# Patient Record
Sex: Female | Born: 1945 | Race: White | Hispanic: No | Marital: Married | Smoking: Former smoker
Health system: Southern US, Community
[De-identification: ages and names within clinical notes are randomized; demographics above are authoritative.]

## PROBLEM LIST (undated history)

## (undated) DIAGNOSIS — J189 Pneumonia, unspecified organism: Secondary | ICD-10-CM

## (undated) DIAGNOSIS — T7840XA Allergy, unspecified, initial encounter: Secondary | ICD-10-CM

## (undated) DIAGNOSIS — M8580 Other specified disorders of bone density and structure, unspecified site: Secondary | ICD-10-CM

## (undated) HISTORY — PX: WISDOM TOOTH EXTRACTION: SHX21

## (undated) HISTORY — PX: NASAL SEPTUM SURGERY: SHX37

## (undated) HISTORY — DX: Other specified disorders of bone density and structure, unspecified site: M85.80

## (undated) HISTORY — DX: Pneumonia, unspecified organism: J18.9

## (undated) HISTORY — PX: BREAST LUMPECTOMY: SHX2

## (undated) HISTORY — PX: NASAL SINUS SURGERY: SHX719

## (undated) HISTORY — DX: Allergy, unspecified, initial encounter: T78.40XA

## (undated) HISTORY — PX: COLONOSCOPY: SHX174

---

## 1999-04-30 ENCOUNTER — Other Ambulatory Visit: Admission: RE | Admit: 1999-04-30 | Discharge: 1999-04-30 | Payer: Self-pay | Admitting: *Deleted

## 2000-08-05 ENCOUNTER — Other Ambulatory Visit: Admission: RE | Admit: 2000-08-05 | Discharge: 2000-08-05 | Payer: Self-pay | Admitting: Obstetrics and Gynecology

## 2001-11-08 ENCOUNTER — Other Ambulatory Visit: Admission: RE | Admit: 2001-11-08 | Discharge: 2001-11-08 | Payer: Self-pay | Admitting: Obstetrics and Gynecology

## 2003-01-25 ENCOUNTER — Other Ambulatory Visit: Admission: RE | Admit: 2003-01-25 | Discharge: 2003-01-25 | Payer: Self-pay | Admitting: Obstetrics and Gynecology

## 2003-03-08 ENCOUNTER — Encounter: Admission: RE | Admit: 2003-03-08 | Discharge: 2003-03-08 | Payer: Self-pay | Admitting: Family Medicine

## 2003-03-09 ENCOUNTER — Encounter: Admission: RE | Admit: 2003-03-09 | Discharge: 2003-03-09 | Payer: Self-pay | Admitting: Family Medicine

## 2004-09-30 ENCOUNTER — Encounter: Admission: RE | Admit: 2004-09-30 | Discharge: 2004-09-30 | Payer: Self-pay | Admitting: Family Medicine

## 2010-08-22 ENCOUNTER — Other Ambulatory Visit: Payer: Medicare Other

## 2010-08-22 ENCOUNTER — Encounter: Payer: Self-pay | Admitting: Internal Medicine

## 2010-08-22 ENCOUNTER — Ambulatory Visit (INDEPENDENT_AMBULATORY_CARE_PROVIDER_SITE_OTHER): Payer: Medicare Other | Admitting: Internal Medicine

## 2010-08-22 VITALS — BP 120/84 | HR 89 | Ht 67.0 in | Wt 115.6 lb

## 2010-08-22 DIAGNOSIS — J310 Chronic rhinitis: Secondary | ICD-10-CM

## 2010-08-22 NOTE — Patient Instructions (Signed)
Order- Allergy Profile/ RAST

## 2010-08-22 NOTE — Progress Notes (Signed)
  Subjective:    Patient ID: Sara Beasley, female    DOB: Jan 21, 1946, 65 y.o.   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 She had nos such problems before 5 years ago. At that time she developed nasal congestion and an aparent sinus infection after helping move her mother out of a dusty apartment. Symptoms cleared partially in the summer. Dr Haroldine Laws did sinus surgery 2 years ago- septoplasty/ turbinate reduction. She drained well after that, but continued to get apparent infections. Serial antibiotics. Usually feels congested, worse in winter. Nasal sprays and saline rinses have been little help. Usually does not feel that she is catching a cold at onset and does not get chest congestion, cough or wheeze, ear pain or itching/ sneezing. Has never recognized seasonal allergic rhinitis.No hx of asthma or ear infections.    Review of Systems See HPI Constitutional:   No weight loss, night sweats,  Fevers, chills, fatigue, lassitude. HEENT: Currently-   No headaches,  Difficulty swallowing,  Tooth/dental problems,  Sore throat,                No sneezing, itching, ear ache,, post nasal drip,   CV:  No chest pain,  Orthopnea, PND, swelling in lower extremities, anasarca, dizziness, palpitations  GI  No heartburn, indigestion, abdominal pain, nausea, vomiting, diarrhea, change in bowel habits, loss of appetite  Resp: No shortness of breath with exertion or at rest.  No excess mucus, no productive cough,  No non-productive cough,  No coughing up of blood.  No change in color of mucus.  No wheezing  Skin: no rash or lesions.  GU: no dysuria, change in color of urine, no urgency or frequency.  No flank pain.  MS:  No joint pain or swelling.  No decreased range of motion.  No back pain.  Psych:  No change in mood or affect. No depression or anxiety.  No memory loss.      Objective:   Physical  Exam General- Alert, Oriented, Affect-appropriate, Distress- none acute  Skin- rash-none, lesions- none, excoriation- none  Lymphadenopathy- none  Head- atraumatic  Eyes- Gross vision intact, PERRLA, conjunctivae clear secretions  Ears- Normal-  Hearing, canals, Tm L ,  R ,  Nose- Clear,  No-Septal dev, mucus, polyps, erosion, perforation   Throat- Mallampati II , mucosa clear , drainage- none, tonsils- atrophic  Neck- flexible , trachea midline, no stridor , thyroid nl, carotid no bruit  Chest - symmetrical excursion , unlabored     Heart/CV- RRR , no murmur , no gallop  , no rub, nl s1 s2                     - JVD- none , edema- none, stasis changes- none, varices- none     Lung- clear to P&A, wheeze- none, cough- none , dullness-none, rub- none     Chest wall-  Abd- tender-no, distended-no, bowel sounds-present, HSM- no  Br/ Gen/ Rectal- Not done, not indicated  Extrem- cyanosis- none, clubbing, none, atrophy- none, strength- nl  Neuro- grossly intact to observation         Assessment & Plan:

## 2010-08-22 NOTE — Assessment & Plan Note (Addendum)
Discussed irritant and infectious, vs allergic rhinosinusitis syndromes. I am asked if her adult onset pattern is allergic. We will start by sending allergy profile/. RAST. She is sensitive to overstimualtion by decongestants. She already uses a saline rinse.

## 2010-08-24 ENCOUNTER — Encounter: Payer: Self-pay | Admitting: Internal Medicine

## 2010-08-25 LAB — ALLERGY FULL PROFILE
Allergen, D pternoyssinus,d7: <0.35 kU/L (ref <0.35)
Allergen,Goose feathers, e70: <0.35 kU/L (ref <0.35)
Alternaria Alternata: <0.35 kU/L (ref <0.35)
Aspergillus fumigatus, IgG: <0.35 kU/L (ref <0.35)
Bahia Grass: 3.45 kU/L — ABNORMAL HIGH (ref <0.35)
Bermuda Grass: 4.11 kU/L — ABNORMAL HIGH (ref <0.35)
Box Elder IgE: 2.74 kU/L — ABNORMAL HIGH (ref <0.35)
Candida Albicans: <0.35 kU/L (ref <0.35)
Cat Dander: <0.35 kU/L (ref <0.35)
Common Ragweed: 3.73 kU/L — ABNORMAL HIGH (ref <0.35)
Curvularia lunata: <0.35 kU/L (ref <0.35)
D. farinae: <0.35 kU/L (ref <0.35)
Dog Dander: <0.35 kU/L (ref <0.35)
Elm IgE: 1.69 kU/L — ABNORMAL HIGH (ref <0.35)
Fescue: 2.95 kU/L — ABNORMAL HIGH (ref <0.35)
G005 Rye, Perennial: 2.8 kU/L — ABNORMAL HIGH (ref <0.35)
G009 Red Top: 3.15 kU/L — ABNORMAL HIGH (ref <0.35)
Goldenrod: 2.04 kU/L — ABNORMAL HIGH (ref <0.35)
Helminthosporium halodes: <0.35 kU/L (ref <0.35)
House Dust Hollister: <0.35 kU/L (ref <0.35)
IgE (Immunoglobulin E), Serum: 60 IU/mL (ref 0.0–180.0)
Lamb's Quarters: 3.02 kU/L — ABNORMAL HIGH (ref <0.35)
Oak: 2.33 kU/L — ABNORMAL HIGH (ref <0.35)
Plantain: 2.57 kU/L — ABNORMAL HIGH (ref <0.35)
Stemphylium Botryosum: <0.35 kU/L (ref <0.35)
Sycamore Tree: 2.53 kU/L — ABNORMAL HIGH (ref <0.35)
Timothy Grass: 2.68 kU/L — ABNORMAL HIGH (ref <0.35)

## 2010-09-15 NOTE — Progress Notes (Signed)
Quick Note:  Pt aware of results and to keep next appt. ______

## 2010-10-03 ENCOUNTER — Ambulatory Visit (INDEPENDENT_AMBULATORY_CARE_PROVIDER_SITE_OTHER): Payer: Medicare Other | Admitting: Internal Medicine

## 2010-10-03 ENCOUNTER — Encounter: Payer: Self-pay | Admitting: Internal Medicine

## 2010-10-03 VITALS — BP 108/60 | HR 68 | Ht 67.0 in | Wt 115.0 lb

## 2010-10-03 DIAGNOSIS — J310 Chronic rhinitis: Secondary | ICD-10-CM

## 2010-10-03 NOTE — Progress Notes (Signed)
Subjective:    Patient ID: Sara Beasley, female    DOB: Mar 17, 1946, 65 y.o.   MRN: [id number]  HPI    Review of Systems     Objective:   Physical Exam        Assessment & Plan:   Subjective:    Patient ID: Sara Beasley, female    DOB: 1946-01-24, 65 y.o.   MRN: [id number]  HPI 08/22/10- 31 yoF remote smoker, referred courtesy of Dr Haroldine Laws for allergy evaluation of chronic / recurrent rhinosinusitis. She had no such problems before 5 years ago. At that time she developed nasal congestion and an apparent sinus infection after helping move her mother out of a dusty apartment. Symptoms cleared partially in the summer. Dr Haroldine Laws did sinus surgery 2 years ago- septoplasty/ turbinate reduction. She drained well after that, but continued to get apparent infections. Serial antibiotics. Usually feels congested, worse in winter. Nasal sprays and saline rinses have been little help. Usually does not feel that she is catching a cold at onset and does not get chest congestion, cough or wheeze, ear pain or itching/ sneezing. Has never recognized seasonal allergic rhinitis.No hx of asthma or ear infections.   10/03/10 - chronic/ recurrent rhinosinusitis Allergy profile last visit- IgE 60 with significant grass and weed elevations. She sees winter predominance of symptoms, finding little effect of pollen and no benefit from singulair or allegra. She thinks infection is fading, with little discolored any more. Gets fairly regular postnasal drip that will trigger some cough.    Review of Systems See HPI Constitutional:   No weight loss, night sweats,  Fevers, chills, fatigue, lassitude. HEENT: Currently-   No headaches,  Difficulty swallowing,  Tooth/dental problems,  Sore throat,                No sneezing, itching, ear ache,  CV:  No chest pain,  Orthopnea, PND, swelling in lower extremities, anasarca, dizziness, palpitations  GI  No heartburn, indigestion, abdominal pain, nausea,  vomiting, diarrhea, change in bowel habits, loss of appetite  Resp: No shortness of breath with exertion or at rest.  No excess mucus, no productive cough,  No non-productive cough,  No coughing up of blood.  No change in color of mucus.  No wheezing  Skin: no rash or lesions.  GU: no dysuria, change in color of urine, no urgency or frequency.  No flank pain.  MS:  No joint pain or swelling.  No decreased range of motion.  No back pain.  Psych:  No change in mood or affect. No depression or anxiety.  No memory loss.      Objective:   Physical Exam General- Alert, Oriented, Affect-appropriate, Distress- none acute  Skin- rash-none, lesions- none, excoriation- none  Lymphadenopathy- none  Head- atraumatic  Eyes- Gross vision intact, PERRLA, conjunctivae clear secretions  Ears- Normal-  Hearing, canals, Tm - normal  Nose- Clear,  No-Septal dev, mucus, polyps, erosion, perforation There is more turbinate edema on right, but she doesn't feel it  Throat- Mallampati II , mucosa clear , drainage- none, tonsils- atrophic  Neck- flexible , trachea midline, no stridor , thyroid nl, carotid no bruit  Chest - symmetrical excursion , unlabored     Heart/CV- RRR , no murmur , no gallop  , no rub, nl s1 s2                     - JVD- none , edema- none, stasis changes- none,  varices- none     Lung- clear to P&A, wheeze- none, cough- none , dullness-none, rub- none     Chest wall-  Abd- tender-no, distended-no, bowel sounds-present, HSM- no  Br/ Gen/ Rectal- Not done, not indicated  Extrem- cyanosis- none, clubbing, none, atrophy- none, strength- nl  Neuro- grossly intact to observation         Assessment & Plan:

## 2010-10-03 NOTE — Assessment & Plan Note (Addendum)
I can't tell that the allergy pattern suggested by her in vitro test correlates well with the winter seasonal pattern she notes. Likely dryness and viruses are important in winter.  We will have her use her Neti pot with a little nasal steroid plus trial of nasalcrom.

## 2010-10-03 NOTE — Patient Instructions (Signed)
Use Neti pot regularly , once or twice daily  Sample Nasonex.   Put 3 sprays of Nasonex into each Neti pot treatment  Try Nasalcrom/ cromolyn nasal spray otc----- you can do both this and Neti pot

## 2010-10-05 ENCOUNTER — Encounter: Payer: Self-pay | Admitting: Internal Medicine

## 2010-10-08 ENCOUNTER — Telehealth: Payer: Self-pay | Admitting: Internal Medicine

## 2010-10-08 MED ORDER — MOMETASONE FUROATE 50 MCG/ACT NA SUSP: NASAL | Status: DC

## 2010-10-08 NOTE — Telephone Encounter (Signed)
Per CY: ok to change directions to reflect how pt is taking the Nasonex.  Rx sent to pharmacy.  LMOM informing pt of this.

## 2010-10-08 NOTE — Telephone Encounter (Signed)
Pt called back.  Pt is requesting rx for Nasonex.  She is still using 3 sprays bid in neti pot.  She states this has worked well for her and would like rx called into [facility] as soon as possible as she is running low on the sample that was given to her at last ov with CY on 6/8.  Pt is aware Cy out of office this PM and will return tomorrow.  Allergies  Allergen Reactions  . Sulfa Antibiotics

## 2010-10-08 NOTE — Telephone Encounter (Signed)
Ok to Rx Nasonex # 1, 2 sprays each nostril once daily at bedtime, refill prn        Thanks

## 2010-10-08 NOTE — Telephone Encounter (Signed)
[id number] 

## 2010-12-09 ENCOUNTER — Telehealth: Payer: Self-pay | Admitting: Pulmonary Disease

## 2010-12-09 NOTE — Telephone Encounter (Signed)
[email or web address].[name], CMA  

## 2010-12-10 ENCOUNTER — Encounter: Payer: Self-pay | Admitting: Internal Medicine

## 2010-12-10 ENCOUNTER — Telehealth: Payer: Self-pay | Admitting: Internal Medicine

## 2010-12-10 NOTE — Telephone Encounter (Signed)
Left message for patient to call me back; see will ask for me.

## 2010-12-10 NOTE — Telephone Encounter (Signed)
Spoke with patient-aware of recs from CY-has appt on 12-19-10 at 130pm to see CY and get depo and nasal tx prior to hiking trip. Pt will let me know then if she would like to have skin testing.

## 2010-12-10 NOTE — Telephone Encounter (Signed)
Returning call.

## 2010-12-10 NOTE — Telephone Encounter (Signed)
In response to her note faxed date 12/01/10- Suggest 1)  Try low dose otc decongestant phenylephrine 5 mg or 10 mg. She is sensitive to stimulants but may tolerate this, especially if taken with food. This could be used off and on as needed.        2) She can get nasal neb treatment with depomedrol injection shortly before her hiking trip        3) We can bring her in for allergy skin testing to see if she could be an allergy vaccine candidate as a                                                                                          long term strategy.

## 2010-12-10 NOTE — Telephone Encounter (Signed)
Per Florentina Addison see 8/15 phone note created by dr young for f/u on this question from pt, Florentina Addison states she will call pt herself since there are several issues she needs to go over with the pt.

## 2010-12-10 NOTE — Telephone Encounter (Signed)
lmomtcb x 2  

## 2010-12-19 ENCOUNTER — Ambulatory Visit (INDEPENDENT_AMBULATORY_CARE_PROVIDER_SITE_OTHER): Payer: Medicare Other | Admitting: Internal Medicine

## 2010-12-19 VITALS — BP 122/70 | HR 63 | Ht 67.0 in | Wt 114.8 lb

## 2010-12-19 DIAGNOSIS — J449 Chronic obstructive pulmonary disease, unspecified: Secondary | ICD-10-CM | POA: Insufficient documentation

## 2010-12-19 DIAGNOSIS — R05 Cough: Secondary | ICD-10-CM

## 2010-12-19 DIAGNOSIS — J310 Chronic rhinitis: Secondary | ICD-10-CM

## 2010-12-19 MED ORDER — BENZONATATE 200 MG PO CAPS: 200.0000 mg | ORAL_CAPSULE | Freq: Three times a day (TID) | ORAL | Status: AC | PRN

## 2010-12-19 NOTE — Assessment & Plan Note (Signed)
Hx bronchopneumonia 2004 with no imaging since. Persistent mild cough may be from nose or chest source. She has not time today for CXR. Consider MAIC?? Will get CXR on return and schedule Allergy skin testing.

## 2010-12-19 NOTE — Patient Instructions (Addendum)
Sample Patanase nasal antihistamine     1-2 puffs each nostril up to twice daily as needed  Script sent for benzonatate for cough as needed  Schedule return for allergy skin testing with   Order: CXR that day for dx cough Antihistamines block the skin tests. Please don't take any antihistamine, including the patanase nose spray, cough or cold preparations, otc cough and sleep meds  For 3 days before you return.

## 2010-12-19 NOTE — Assessment & Plan Note (Addendum)
She wants to avoid systemic steroids which is appropriate and reasonable as discussed. We will let her try tessalon and sample Patanase for her hiking trip.  She asked [place] to schedule allergy skin testing on her return.

## 2010-12-19 NOTE — Progress Notes (Signed)
Subjective:    Patient ID: Sara Beasley, female    DOB: 11-02-1945, 65 y.o.   MRN: [id number]  HPI    Review of Systems     Objective:   Physical Exam        Assessment & Plan:   Subjective:    Patient ID: Sara Beasley, female    DOB: October 22, 1945, 65 y.o.   MRN: [id number]  HPI    Review of Systems     Objective:   Physical Exam        Assessment & Plan:   Subjective:    Patient ID: Sara Beasley, female    DOB: 15-Nov-1945, 65 y.o.   MRN: [id number]  HPI 08/22/10- 61 yoF remote smoker, referred courtesy of Dr Haroldine Laws for allergy evaluation of chronic / recurrent rhinosinusitis. She had no such problems before 5 years ago. At that time she developed nasal congestion and an apparent sinus infection after helping move her mother out of a dusty apartment. Symptoms cleared partially in the summer. Dr Haroldine Laws did sinus surgery 2 years ago- septoplasty/ turbinate reduction. She drained well after that, but continued to get apparent infections. Serial antibiotics. Usually feels congested, worse in winter. Nasal sprays and saline rinses have been little help. Usually does not feel that she is catching a cold at onset and does not get chest congestion, cough or wheeze, ear pain or itching/ sneezing. Has never recognized seasonal allergic rhinitis.No hx of asthma or ear infections.   10/03/10 - chronic/ recurrent rhinosinusitis Allergy profile last visit- IgE 60 with significant grass and weed elevations. She sees winter predominance of symptoms, finding little effect of pollen and no benefit from singulair or allegra. She thinks infection is fading, with little discolored any more. Gets fairly regular postnasal drip that will trigger some cough.   12/19/10  64 yoF remote smoker, referred courtesy of Dr Haroldine Laws for allergy evaluation of chronic / recurrent rhinosinusitis. She called wanting treatment before hiking trip.  She reported trying a series of  remedies. She  is now doing Singulair, Nasonex and Sudafed 1/2 x 240mg . Notes drainage and cough with yellow and throat clearing, rather than stuffiness. It is much less intense than it was.  Hx  of bronchopneumonia in 2004 by CT and CXR, with no f/u since.  Allergy profile had shown IgE 60 with elevations. She thinks she may be interested in skin testing in the future.   Review of Systems See HPI Constitutional:   No weight loss, night sweats,  Fevers, chills, fatigue, lassitude. HEENT: Currently-   No headaches,  Difficulty swallowing,  Tooth/dental problems,  Sore throat,                No sneezing, itching, ear ache, CV:  No chest pain,  Orthopnea, PND, swelling in lower extremities, anasarca, dizziness, palpitations GI  No heartburn, indigestion, abdominal pain, nausea, vomiting, diarrhea, change in bowel habits, loss of appetite Resp: No shortness of breath with exertion or at rest.  No excess mucus, no productive cough,  No non-productive cough,  No coughing up of blood.  No change in color of mucus.  No wheezing Skin: no rash or lesions. GU: no dysuria, change in color of urine, no urgency or frequency.  No flank pain. MS:  No joint pain or swelling.  No decreased range of motion.  No back pain. Psych:  No change in mood or affect. No depression or anxiety.  No memory loss.  Objective:   Physical Exam General- Alert, Oriented, Affect-appropriate, Distress- none acute,  slender Skin- rash-none, lesions- none, excoriation- none Lymphadenopathy- none Head- atraumatic            Eyes- Gross vision intact, PERRLA, conjunctivae clear secretions            Ears- Hearing, canals normal            Nose- Clear, no-Septal dev, mucus, polyps, erosion, perforation             Throat- Mallampati II , mucosa clear , drainage- none, tonsils- atrophic Neck- flexible , trachea midline, no stridor , thyroid nl, carotid no bruit Chest - symmetrical excursion , unlabored           Heart/CV- RRR , no murmur ,  no gallop  , no rub, nl s1 s2                           - JVD- none , edema- none, stasis changes- none, varices- none           Lung- clear to P&A, wheeze- none, cough- none , dullness-none, rub- none           Chest wall-  Abd- tender-no, distended-no, bowel sounds-present, HSM- no Br/ Gen/ Rectal- Not done, not indicated Extrem- cyanosis- none, clubbing, none, atrophy- none, strength- nl Neuro- grossly intact to observation          Assessment & Plan:

## 2010-12-23 ENCOUNTER — Encounter: Payer: Self-pay | Admitting: Internal Medicine

## 2011-01-07 ENCOUNTER — Telehealth: Payer: Self-pay | Admitting: Internal Medicine

## 2011-01-07 NOTE — Telephone Encounter (Signed)
KATIE- PLEASE MAKE SURE SHE KNOWS THE PROTOCOL FOR ALLERGY TESTING. THANKS. Hazel Sams

## 2011-01-07 NOTE — Telephone Encounter (Signed)
LMTCB

## 2011-01-08 NOTE — Telephone Encounter (Signed)
Spoke with patient-scheduled for 01-19-11 at 230 and aware of allergy protocol.

## 2011-01-19 ENCOUNTER — Ambulatory Visit: Payer: Medicare Other | Admitting: Internal Medicine

## 2011-01-19 ENCOUNTER — Ambulatory Visit (INDEPENDENT_AMBULATORY_CARE_PROVIDER_SITE_OTHER): Payer: Medicare Other | Admitting: Internal Medicine

## 2011-01-19 ENCOUNTER — Encounter: Payer: Self-pay | Admitting: Internal Medicine

## 2011-01-19 VITALS — BP 136/80 | HR 80 | Ht 67.0 in | Wt 113.6 lb

## 2011-01-19 DIAGNOSIS — R05 Cough: Secondary | ICD-10-CM

## 2011-01-19 DIAGNOSIS — J310 Chronic rhinitis: Secondary | ICD-10-CM

## 2011-01-19 DIAGNOSIS — R059 Cough, unspecified: Secondary | ICD-10-CM

## 2011-01-19 NOTE — Progress Notes (Signed)
Subjective:    Patient ID: Sara Beasley, female    DOB: [birth year], 65 y.o.   MRN: [id number]  HPI  HPI 08/22/10- 25 yoF remote smoker, referred courtesy of Dr Haroldine Laws for allergy evaluation of chronic / recurrent rhinosinusitis. She had no such problems before 5 years ago. At that time she developed nasal congestion and an apparent sinus infection after helping move her mother out of a dusty apartment. Symptoms cleared partially in the summer. Dr Haroldine Laws did sinus surgery 2 years ago- septoplasty/ turbinate reduction. She drained well after that, but continued to get apparent infections. Serial antibiotics. Usually feels congested, worse in winter. Nasal sprays and saline rinses have been little help. Usually does not feel that she is catching a cold at onset and does not get chest congestion, cough or wheeze, ear pain or itching/ sneezing. Has never recognized seasonal allergic rhinitis.No hx of asthma or ear infections.   10/03/10 - chronic/ recurrent rhinosinusitis Allergy profile last visit- IgE 60 with significant grass and weed elevations. She sees winter predominance of symptoms, finding little effect of pollen and no benefit from singulair or allegra. She thinks infection is fading, with little discolored any more. Gets fairly regular postnasal drip that will trigger some cough.   12/19/10  64 yoF remote smoker, referred courtesy of Dr Haroldine Laws for allergy evaluation of chronic / recurrent rhinosinusitis. She called wanting treatment before hiking trip.  She reported trying a series of  remedies. She is now doing Singulair, Nasonex and Sudafed 1/2 x 240mg . Notes drainage and cough with yellow and throat clearing, rather than stuffiness. It is much less intense than it was.  Hx  of bronchopneumonia in 2004 by CT and CXR, with no f/u since.  Allergy profile had shown IgE 60 with elevations. She thinks she may be interested in skin testing in the future.   01/19/11-  64 yoF remote smoker,  referred courtesy of Dr Haroldine Laws for allergy evaluation of chronic / recurrent rhinosinusitis. Since last here had vacationed in [address] with wetter weather and felt more nasal congestion. Now in last few days she has been off meds for allergy testing, and weather is much cooler. She notices more runny, stuffy nose, throat clearing and sinus pressure. Chest not changed-ok.  Skin test-significant positive reactions for grass, tree, and weed pollens, dust mite, cockroach. She considers winter her worst season. We agreed to have her go back on her current maintenance medicines and see how she does this winter. We did in talk in detail about the possibility of allergy vaccine including realistic descriptions of logistics, risks and benefits.   Review of Systems Review of Systems See HPI Constitutional:   No weight loss, night sweats,  Fevers, chills, fatigue, lassitude. HEENT: No headaches,  Difficulty swallowing,  Tooth/dental problems,  Sore throat,                No sneezing, itching, ear ache, CV:  No chest pain,  Orthopnea, PND, swelling in lower extremities, anasarca, dizziness, palpitations GI  No heartburn, indigestion, abdominal pain, nausea, vomiting, diarrhea, change in bowel habits, loss of appetite Resp: No shortness of breath with exertion or at rest.  No excess mucus, no productive cough,  Minimal non-productive cough,  No coughing up of blood.  No change in color of mucus.  No wheezing Skin: no rash or lesions. GU: no dysuria, change in color of urine, no urgency or frequency.  No flank pain. MS:  No joint pain or swelling.  No decreased range of motion.  No back pain. Psych:  No change in mood or affect. No depression or anxiety.  No memory loss.      Objective:   Physical Exam General- Alert, Oriented, Affect-appropriate, Distress- none acute,  Slender/ thin chest Skin- rash-none, lesions- none, excoriation- none Lymphadenopathy- none Head- atraumatic             Eyes- Gross vision intact, PERRLA, conjunctivae clear secretions            Ears- Hearing, canals normal            Nose- Clear, no-Septal dev,  polyps, erosion, perforation;  + mucus bridging            Throat- Mallampati II , mucosa clear, dry;  drainage- none, tonsils- atrophic. +Repeated throat clearing. Neck- flexible , trachea midline, no stridor , thyroid nl, carotid no bruit Chest - symmetrical excursion , unlabored           Heart/CV- RRR , no murmur , no gallop  , no rub, nl s1 s2                           - JVD- none , edema- none, stasis changes- none, varices- none           Lung- clear to P&A, wheeze- none, cough- none , dullness-none, rub- none           Chest wall-  Abd- tender-no, distended-no, bowel sounds-present, HSM- no Br/ Gen/ Rectal- Not done, not indicated Extrem- cyanosis- none, clubbing, none, atrophy- none, strength- nl Neuro- grossly intact to observation

## 2011-01-19 NOTE — Patient Instructions (Signed)
Resume your current routine meds and let's see how you do. We can discuss options when you return in November.

## 2011-01-20 NOTE — Assessment & Plan Note (Signed)
She has been reasonably comfortable with her medications and appreciates the more having been off them for a few days prior to the skin testing. We talked very carefully about allergy vaccine as an option and she said she might want to try if she has a difficult winter and spring again this year. We discussed wintertime as less often and allergy issue, unless house dust is important, and more often related to temperature, humidity, dryness and colds. I gave information about environmental dust controls.

## 2011-01-20 NOTE — Assessment & Plan Note (Signed)
Cough seems much less important to her currently. Allergic rhinitis with postnasal drip may be the explanation.

## 2011-01-29 ENCOUNTER — Encounter: Payer: Self-pay | Admitting: Internal Medicine

## 2011-02-18 ENCOUNTER — Telehealth: Payer: Self-pay | Admitting: *Deleted

## 2011-02-18 NOTE — Telephone Encounter (Signed)
Pt. Called is interested in starting vac.Marland Kitchen She is first calling her [email or web address].to find out if they will pay for her shots and her vac. Or if she will have to pay. Her ins. Co[employer] Is asking what she is allergic to. Please write a rx so I can make her a card and fax it to her so she can fax it to her insurance co.. If you're okay with that.

## 2011-02-19 NOTE — Telephone Encounter (Signed)
Script written. To start allergy vaccine

## 2011-02-23 NOTE — Telephone Encounter (Signed)
Called pt. 02-23-11 will have vac. ready when pt. comes in Fri.Marland Kitchen

## 2011-02-25 ENCOUNTER — Ambulatory Visit (INDEPENDENT_AMBULATORY_CARE_PROVIDER_SITE_OTHER): Payer: Medicare Other

## 2011-02-25 DIAGNOSIS — J309 Allergic rhinitis, unspecified: Secondary | ICD-10-CM

## 2011-03-03 ENCOUNTER — Telehealth: Payer: Self-pay | Admitting: Internal Medicine

## 2011-03-03 NOTE — Telephone Encounter (Signed)
Called pt. At home & work left messages that I had talked to her last Mon. And she said she would be in that Fri.. Today I told her if she had any questions to please call me.

## 2011-03-04 ENCOUNTER — Ambulatory Visit: Payer: Medicare Other | Admitting: Internal Medicine

## 2011-03-04 ENCOUNTER — Ambulatory Visit (INDEPENDENT_AMBULATORY_CARE_PROVIDER_SITE_OTHER): Payer: Medicare Other

## 2011-03-04 DIAGNOSIS — J309 Allergic rhinitis, unspecified: Secondary | ICD-10-CM

## 2011-03-06 ENCOUNTER — Ambulatory Visit (INDEPENDENT_AMBULATORY_CARE_PROVIDER_SITE_OTHER): Payer: Medicare Other

## 2011-03-06 DIAGNOSIS — J309 Allergic rhinitis, unspecified: Secondary | ICD-10-CM

## 2011-03-10 ENCOUNTER — Ambulatory Visit (INDEPENDENT_AMBULATORY_CARE_PROVIDER_SITE_OTHER): Payer: Medicare Other

## 2011-03-10 DIAGNOSIS — J309 Allergic rhinitis, unspecified: Secondary | ICD-10-CM

## 2011-03-13 ENCOUNTER — Ambulatory Visit (INDEPENDENT_AMBULATORY_CARE_PROVIDER_SITE_OTHER): Payer: Medicare Other

## 2011-03-13 DIAGNOSIS — J309 Allergic rhinitis, unspecified: Secondary | ICD-10-CM

## 2011-03-16 ENCOUNTER — Ambulatory Visit: Payer: Medicare Other | Admitting: Internal Medicine

## 2011-03-17 ENCOUNTER — Ambulatory Visit (INDEPENDENT_AMBULATORY_CARE_PROVIDER_SITE_OTHER): Payer: Medicare Other

## 2011-03-17 DIAGNOSIS — J309 Allergic rhinitis, unspecified: Secondary | ICD-10-CM

## 2011-03-20 ENCOUNTER — Ambulatory Visit (INDEPENDENT_AMBULATORY_CARE_PROVIDER_SITE_OTHER): Payer: Medicare Other

## 2011-03-20 DIAGNOSIS — J309 Allergic rhinitis, unspecified: Secondary | ICD-10-CM

## 2011-03-23 ENCOUNTER — Ambulatory Visit (INDEPENDENT_AMBULATORY_CARE_PROVIDER_SITE_OTHER): Payer: Medicare Other

## 2011-03-23 DIAGNOSIS — J309 Allergic rhinitis, unspecified: Secondary | ICD-10-CM

## 2011-03-26 ENCOUNTER — Ambulatory Visit (INDEPENDENT_AMBULATORY_CARE_PROVIDER_SITE_OTHER): Payer: Medicare Other

## 2011-03-26 DIAGNOSIS — J309 Allergic rhinitis, unspecified: Secondary | ICD-10-CM

## 2011-04-01 ENCOUNTER — Ambulatory Visit (INDEPENDENT_AMBULATORY_CARE_PROVIDER_SITE_OTHER): Payer: Medicare Other

## 2011-04-01 DIAGNOSIS — J309 Allergic rhinitis, unspecified: Secondary | ICD-10-CM

## 2011-04-02 ENCOUNTER — Ambulatory Visit (INDEPENDENT_AMBULATORY_CARE_PROVIDER_SITE_OTHER): Payer: Medicare Other

## 2011-04-02 DIAGNOSIS — J309 Allergic rhinitis, unspecified: Secondary | ICD-10-CM

## 2011-04-03 ENCOUNTER — Ambulatory Visit (INDEPENDENT_AMBULATORY_CARE_PROVIDER_SITE_OTHER): Payer: Medicare Other

## 2011-04-03 DIAGNOSIS — J309 Allergic rhinitis, unspecified: Secondary | ICD-10-CM

## 2011-04-03 DIAGNOSIS — Z23 Encounter for immunization: Secondary | ICD-10-CM

## 2011-04-07 ENCOUNTER — Ambulatory Visit (INDEPENDENT_AMBULATORY_CARE_PROVIDER_SITE_OTHER): Payer: Medicare Other

## 2011-04-07 DIAGNOSIS — J309 Allergic rhinitis, unspecified: Secondary | ICD-10-CM

## 2011-04-10 ENCOUNTER — Ambulatory Visit (INDEPENDENT_AMBULATORY_CARE_PROVIDER_SITE_OTHER): Payer: Medicare Other

## 2011-04-10 ENCOUNTER — Ambulatory Visit: Payer: Medicare Other | Admitting: Internal Medicine

## 2011-04-10 DIAGNOSIS — J309 Allergic rhinitis, unspecified: Secondary | ICD-10-CM

## 2011-04-14 ENCOUNTER — Ambulatory Visit (INDEPENDENT_AMBULATORY_CARE_PROVIDER_SITE_OTHER): Payer: Medicare Other

## 2011-04-14 DIAGNOSIS — J309 Allergic rhinitis, unspecified: Secondary | ICD-10-CM

## 2011-04-20 ENCOUNTER — Ambulatory Visit (INDEPENDENT_AMBULATORY_CARE_PROVIDER_SITE_OTHER): Payer: Medicare Other

## 2011-04-20 DIAGNOSIS — J309 Allergic rhinitis, unspecified: Secondary | ICD-10-CM

## 2011-04-24 ENCOUNTER — Ambulatory Visit (INDEPENDENT_AMBULATORY_CARE_PROVIDER_SITE_OTHER): Payer: Medicare Other

## 2011-04-24 DIAGNOSIS — J309 Allergic rhinitis, unspecified: Secondary | ICD-10-CM

## 2011-04-27 ENCOUNTER — Ambulatory Visit (INDEPENDENT_AMBULATORY_CARE_PROVIDER_SITE_OTHER): Payer: Medicare Other

## 2011-04-27 DIAGNOSIS — J309 Allergic rhinitis, unspecified: Secondary | ICD-10-CM

## 2011-04-29 ENCOUNTER — Ambulatory Visit (INDEPENDENT_AMBULATORY_CARE_PROVIDER_SITE_OTHER): Payer: Medicare Other

## 2011-04-29 DIAGNOSIS — J309 Allergic rhinitis, unspecified: Secondary | ICD-10-CM

## 2011-04-30 ENCOUNTER — Ambulatory Visit (INDEPENDENT_AMBULATORY_CARE_PROVIDER_SITE_OTHER): Payer: Medicare Other

## 2011-04-30 DIAGNOSIS — J309 Allergic rhinitis, unspecified: Secondary | ICD-10-CM

## 2011-05-04 ENCOUNTER — Ambulatory Visit: Payer: Medicare Other | Admitting: Internal Medicine

## 2011-05-04 ENCOUNTER — Encounter: Payer: Self-pay | Admitting: Internal Medicine

## 2011-05-06 ENCOUNTER — Ambulatory Visit (INDEPENDENT_AMBULATORY_CARE_PROVIDER_SITE_OTHER): Payer: Medicare Other

## 2011-05-06 DIAGNOSIS — J309 Allergic rhinitis, unspecified: Secondary | ICD-10-CM

## 2011-05-08 ENCOUNTER — Ambulatory Visit (INDEPENDENT_AMBULATORY_CARE_PROVIDER_SITE_OTHER): Payer: Medicare Other

## 2011-05-08 DIAGNOSIS — J309 Allergic rhinitis, unspecified: Secondary | ICD-10-CM

## 2011-05-12 ENCOUNTER — Ambulatory Visit (INDEPENDENT_AMBULATORY_CARE_PROVIDER_SITE_OTHER): Payer: Medicare Other

## 2011-05-12 DIAGNOSIS — J309 Allergic rhinitis, unspecified: Secondary | ICD-10-CM

## 2011-05-15 ENCOUNTER — Ambulatory Visit (INDEPENDENT_AMBULATORY_CARE_PROVIDER_SITE_OTHER): Payer: Medicare Other

## 2011-05-15 DIAGNOSIS — J309 Allergic rhinitis, unspecified: Secondary | ICD-10-CM

## 2011-05-19 ENCOUNTER — Ambulatory Visit (INDEPENDENT_AMBULATORY_CARE_PROVIDER_SITE_OTHER): Payer: Medicare Other

## 2011-05-19 DIAGNOSIS — J309 Allergic rhinitis, unspecified: Secondary | ICD-10-CM

## 2011-05-22 ENCOUNTER — Ambulatory Visit (INDEPENDENT_AMBULATORY_CARE_PROVIDER_SITE_OTHER): Payer: Medicare Other | Admitting: Internal Medicine

## 2011-05-22 ENCOUNTER — Encounter: Payer: Self-pay | Admitting: Internal Medicine

## 2011-05-22 ENCOUNTER — Ambulatory Visit (INDEPENDENT_AMBULATORY_CARE_PROVIDER_SITE_OTHER): Payer: Medicare Other

## 2011-05-22 VITALS — BP 148/78 | HR 72 | Ht 67.0 in | Wt 117.6 lb

## 2011-05-22 DIAGNOSIS — J309 Allergic rhinitis, unspecified: Secondary | ICD-10-CM

## 2011-05-22 DIAGNOSIS — J310 Chronic rhinitis: Secondary | ICD-10-CM

## 2011-05-22 NOTE — Patient Instructions (Signed)
Continue present treatments.   It would be ok to try off and on Allegra some if you want to see what it is doing- it works the day you take it without a lot of carryover.

## 2011-05-22 NOTE — Progress Notes (Signed)
Patient ID: Sara Beasley, female    DOB: [birth year], 66 y.o.   MRN: [id number]  HPI  HPI 08/22/10- 17 yoF remote smoker, referred courtesy of Dr Haroldine Laws for allergy evaluation of chronic / recurrent rhinosinusitis. She had no such problems before 5 years ago. At that time she developed nasal congestion and an apparent sinus infection after helping move her mother out of a dusty apartment. Symptoms cleared partially in the summer. Dr Haroldine Laws did sinus surgery 2 years ago- septoplasty/ turbinate reduction. She drained well after that, but continued to get apparent infections. Serial antibiotics. Usually feels congested, worse in winter. Nasal sprays and saline rinses have been little help. Usually does not feel that she is catching a cold at onset and does not get chest congestion, cough or wheeze, ear pain or itching/ sneezing. Has never recognized seasonal allergic rhinitis.No hx of asthma or ear infections.   10/03/10 - chronic/ recurrent rhinosinusitis Allergy profile last visit- IgE 60 with significant grass and weed elevations. She sees winter predominance of symptoms, finding little effect of pollen and no benefit from singulair or allegra. She thinks infection is fading, with little discolored any more. Gets fairly regular postnasal drip that will trigger some cough.   12/19/10  64 yoF remote smoker, referred courtesy of Dr Haroldine Laws for allergy evaluation of chronic / recurrent rhinosinusitis. She called wanting treatment before hiking trip.  She reported trying a series of  remedies. She is now doing Singulair, Nasonex and Sudafed 1/2 x 240mg . Notes drainage and cough with yellow and throat clearing, rather than stuffiness. It is much less intense than it was.  Hx  of bronchopneumonia in 2004 by CT and CXR, with no f/u since.  Allergy profile had shown IgE 60 with elevations. She thinks she may be interested in skin testing in the future.   01/19/11-  64 yoF remote smoker, referred  courtesy of Dr Haroldine Laws for allergy evaluation of chronic / recurrent rhinosinusitis. Since last here had vacationed in [address] with wetter weather and felt more nasal congestion. Now in last few days she has been off meds for allergy testing, and weather is much cooler. She notices more runny, stuffy nose, throat clearing and sinus pressure. Chest not changed-ok.  Skin test-significant positive reactions for grass, tree, and weed pollens, dust mite, cockroach. She considers winter her worst season. We agreed to have her go back on her current maintenance medicines and see how she does this winter. We did in talk in detail about the possibility of allergy vaccine including realistic descriptions of logistics, risks and benefits.  05/22/11- 64 yoF remote smoker, referred courtesy of Dr Haroldine Laws for allergy evaluation of chronic / recurrent rhinosinusitis. She is hopeful allergy vaccine is contributing to the improvement she notices. She recognizes also the drier weather is helpful. Has not had a significant respiratory infection this winter. Still taking Allegra. Did have a sinusitis in October treated with Cleocin. Nasal discharge is still occasionally a little purulent with some blood but no pain or fever. She denies headache. Off of all nasal sprays now. Daily cough starts about 6 p.m. Occasional use of saline nasal rinse.  Review of Systems- See HPI Constitutional:   No weight loss, night sweats,  Fevers, chills, fatigue, lassitude. HEENT: No headaches,  Difficulty swallowing,  Tooth/dental problems,  Sore throat,                No sneezing, itching, ear ache, CV:  No chest pain,  Orthopnea,  PND, swelling in lower extremities, anasarca, dizziness, palpitations GI  No heartburn, indigestion, abdominal pain, nausea, vomiting, diarrhea, change in bowel habits, loss of appetite Resp: No shortness of breath with exertion or at rest.  No excess mucus, no productive cough,  Minimal non-productive  cough,  No coughing up of blood.  No change in color of mucus.  No wheezing Skin: no rash or lesions. GU: MS:  No joint pain or swelling.  No decreased range of motion.  No back pain. Psych:  No change in mood or affect. No depression or anxiety.  No memory loss.      Objective:   Physical Exam General- Alert, Oriented, Affect-appropriate, Distress- none acute,  Slender/ thin chest Skin- rash-none, lesions- none, excoriation- none Lymphadenopathy- none Head- atraumatic            Eyes- Gross vision intact, PERRLA, conjunctivae clear secretions            Ears- Hearing, canals normal            Nose- ? Some leukoplakia in nose vs white mucus and some red areas., no-Septal dev,  polyps, erosion, perforation;  + mucus bridging            Throat- Mallampati II , mucosa clear, dry;  drainage- none, tonsils- atrophic. +Repeated throat clearing. Neck- flexible , trachea midline, no stridor , thyroid nl, carotid no bruit Chest - symmetrical excursion , unlabored           Heart/CV- RRR , no murmur , no gallop  , no rub, nl s1 s2                           - JVD- none , edema- none, stasis changes- none, varices- none           Lung- clear to P&A, slight squeak cleared quickly, wheeze- none, cough- none , dullness-none, rub- none           Chest wall-  Abd- Br/ Gen/ Rectal- Not done, not indicated Extrem- cyanosis- none, clubbing, none, atrophy- none, strength- nl Neuro- grossly intact to observation

## 2011-05-24 NOTE — Assessment & Plan Note (Signed)
She will continue allergy vaccine we'll watch for changes as the spring pollen season comes in.

## 2011-05-27 ENCOUNTER — Ambulatory Visit (INDEPENDENT_AMBULATORY_CARE_PROVIDER_SITE_OTHER): Payer: Medicare Other

## 2011-05-27 DIAGNOSIS — J309 Allergic rhinitis, unspecified: Secondary | ICD-10-CM

## 2011-05-29 ENCOUNTER — Ambulatory Visit (INDEPENDENT_AMBULATORY_CARE_PROVIDER_SITE_OTHER): Payer: Medicare Other

## 2011-05-29 DIAGNOSIS — J309 Allergic rhinitis, unspecified: Secondary | ICD-10-CM

## 2011-06-01 ENCOUNTER — Ambulatory Visit (INDEPENDENT_AMBULATORY_CARE_PROVIDER_SITE_OTHER): Payer: Medicare Other

## 2011-06-01 DIAGNOSIS — J309 Allergic rhinitis, unspecified: Secondary | ICD-10-CM

## 2011-06-02 ENCOUNTER — Ambulatory Visit (INDEPENDENT_AMBULATORY_CARE_PROVIDER_SITE_OTHER): Payer: Medicare Other

## 2011-06-02 DIAGNOSIS — J309 Allergic rhinitis, unspecified: Secondary | ICD-10-CM

## 2011-06-05 ENCOUNTER — Ambulatory Visit (INDEPENDENT_AMBULATORY_CARE_PROVIDER_SITE_OTHER): Payer: Medicare Other

## 2011-06-05 DIAGNOSIS — J309 Allergic rhinitis, unspecified: Secondary | ICD-10-CM

## 2011-06-09 ENCOUNTER — Ambulatory Visit (INDEPENDENT_AMBULATORY_CARE_PROVIDER_SITE_OTHER): Payer: Medicare Other

## 2011-06-09 ENCOUNTER — Telehealth: Payer: Self-pay | Admitting: *Deleted

## 2011-06-09 DIAGNOSIS — J309 Allergic rhinitis, unspecified: Secondary | ICD-10-CM

## 2011-06-09 NOTE — Telephone Encounter (Signed)
Pt.started sneezing uncontrollably last Wed.&Thurs.(10 to 12 times a day) Sneezing started subsiding Fri.. Then Sat.&Sun.she got congested,had sinus pressure. She saw Dr.CrossleyMon. He gave her zithromaz ,mucinex & 1/2 of an allegra. Her question is could the sneezing have brought on the sinus infection? Please advise pt.Marland Kitchen

## 2011-06-10 NOTE — Telephone Encounter (Signed)
Pt aware that sneezing did not cause her recent symptoms that were treated by Dr. Haroldine Laws. And verbalized understanding.

## 2011-06-10 NOTE — Telephone Encounter (Signed)
Pt returned triage's call.  Holly D Pryor ° °

## 2011-06-10 NOTE — Telephone Encounter (Signed)
lmomtcb x1 for pt 

## 2011-06-10 NOTE — Telephone Encounter (Signed)
Sneezing marked the beginning of inflammation that led to swelling, increased mucus, poor drainage, and then bacterial growth in the sinuses. Sneezing was just the first sign, not the cause.

## 2011-06-12 ENCOUNTER — Ambulatory Visit (INDEPENDENT_AMBULATORY_CARE_PROVIDER_SITE_OTHER): Payer: Medicare Other

## 2011-06-12 DIAGNOSIS — J309 Allergic rhinitis, unspecified: Secondary | ICD-10-CM

## 2011-06-17 ENCOUNTER — Ambulatory Visit (INDEPENDENT_AMBULATORY_CARE_PROVIDER_SITE_OTHER): Payer: Medicare Other

## 2011-06-17 DIAGNOSIS — J309 Allergic rhinitis, unspecified: Secondary | ICD-10-CM

## 2011-06-18 ENCOUNTER — Encounter: Payer: Self-pay | Admitting: Internal Medicine

## 2011-06-24 ENCOUNTER — Ambulatory Visit (INDEPENDENT_AMBULATORY_CARE_PROVIDER_SITE_OTHER): Payer: Medicare Other

## 2011-06-24 DIAGNOSIS — J309 Allergic rhinitis, unspecified: Secondary | ICD-10-CM

## 2011-07-03 ENCOUNTER — Ambulatory Visit (INDEPENDENT_AMBULATORY_CARE_PROVIDER_SITE_OTHER): Payer: Medicare Other

## 2011-07-03 DIAGNOSIS — J309 Allergic rhinitis, unspecified: Secondary | ICD-10-CM

## 2011-07-13 ENCOUNTER — Ambulatory Visit (INDEPENDENT_AMBULATORY_CARE_PROVIDER_SITE_OTHER): Payer: Medicare Other

## 2011-07-13 DIAGNOSIS — J309 Allergic rhinitis, unspecified: Secondary | ICD-10-CM

## 2011-07-22 ENCOUNTER — Ambulatory Visit (INDEPENDENT_AMBULATORY_CARE_PROVIDER_SITE_OTHER): Payer: Medicare Other

## 2011-07-22 DIAGNOSIS — J309 Allergic rhinitis, unspecified: Secondary | ICD-10-CM

## 2011-07-29 ENCOUNTER — Ambulatory Visit (INDEPENDENT_AMBULATORY_CARE_PROVIDER_SITE_OTHER): Payer: Medicare Other

## 2011-07-29 DIAGNOSIS — J309 Allergic rhinitis, unspecified: Secondary | ICD-10-CM

## 2011-08-05 ENCOUNTER — Ambulatory Visit (INDEPENDENT_AMBULATORY_CARE_PROVIDER_SITE_OTHER): Payer: Medicare Other

## 2011-08-05 DIAGNOSIS — J309 Allergic rhinitis, unspecified: Secondary | ICD-10-CM

## 2011-08-12 ENCOUNTER — Ambulatory Visit (INDEPENDENT_AMBULATORY_CARE_PROVIDER_SITE_OTHER): Payer: Medicare Other

## 2011-08-12 DIAGNOSIS — J309 Allergic rhinitis, unspecified: Secondary | ICD-10-CM

## 2011-08-19 ENCOUNTER — Ambulatory Visit (INDEPENDENT_AMBULATORY_CARE_PROVIDER_SITE_OTHER): Payer: Medicare Other

## 2011-08-19 DIAGNOSIS — J309 Allergic rhinitis, unspecified: Secondary | ICD-10-CM

## 2011-08-26 ENCOUNTER — Ambulatory Visit (INDEPENDENT_AMBULATORY_CARE_PROVIDER_SITE_OTHER): Payer: Medicare Other

## 2011-08-26 DIAGNOSIS — J309 Allergic rhinitis, unspecified: Secondary | ICD-10-CM

## 2011-09-02 ENCOUNTER — Ambulatory Visit (INDEPENDENT_AMBULATORY_CARE_PROVIDER_SITE_OTHER): Payer: Medicare Other

## 2011-09-02 DIAGNOSIS — J309 Allergic rhinitis, unspecified: Secondary | ICD-10-CM

## 2011-09-08 ENCOUNTER — Encounter: Payer: Self-pay | Admitting: Internal Medicine

## 2011-09-09 ENCOUNTER — Ambulatory Visit (INDEPENDENT_AMBULATORY_CARE_PROVIDER_SITE_OTHER): Payer: Medicare Other

## 2011-09-09 DIAGNOSIS — J309 Allergic rhinitis, unspecified: Secondary | ICD-10-CM

## 2011-09-14 ENCOUNTER — Ambulatory Visit (INDEPENDENT_AMBULATORY_CARE_PROVIDER_SITE_OTHER): Payer: Medicare Other

## 2011-09-14 DIAGNOSIS — J309 Allergic rhinitis, unspecified: Secondary | ICD-10-CM

## 2011-09-17 ENCOUNTER — Ambulatory Visit (INDEPENDENT_AMBULATORY_CARE_PROVIDER_SITE_OTHER): Payer: Medicare Other

## 2011-09-17 DIAGNOSIS — J309 Allergic rhinitis, unspecified: Secondary | ICD-10-CM

## 2011-09-22 ENCOUNTER — Ambulatory Visit: Payer: Medicare Other | Admitting: Internal Medicine

## 2011-09-25 ENCOUNTER — Ambulatory Visit (INDEPENDENT_AMBULATORY_CARE_PROVIDER_SITE_OTHER): Payer: Medicare Other

## 2011-09-25 DIAGNOSIS — J309 Allergic rhinitis, unspecified: Secondary | ICD-10-CM

## 2011-10-02 ENCOUNTER — Ambulatory Visit (INDEPENDENT_AMBULATORY_CARE_PROVIDER_SITE_OTHER): Payer: Medicare Other

## 2011-10-02 DIAGNOSIS — J309 Allergic rhinitis, unspecified: Secondary | ICD-10-CM

## 2011-10-09 ENCOUNTER — Ambulatory Visit (INDEPENDENT_AMBULATORY_CARE_PROVIDER_SITE_OTHER): Payer: Medicare Other

## 2011-10-09 DIAGNOSIS — J309 Allergic rhinitis, unspecified: Secondary | ICD-10-CM

## 2011-10-16 ENCOUNTER — Ambulatory Visit (INDEPENDENT_AMBULATORY_CARE_PROVIDER_SITE_OTHER): Payer: Medicare Other

## 2011-10-16 DIAGNOSIS — J309 Allergic rhinitis, unspecified: Secondary | ICD-10-CM

## 2011-10-23 ENCOUNTER — Ambulatory Visit (INDEPENDENT_AMBULATORY_CARE_PROVIDER_SITE_OTHER): Payer: Medicare Other

## 2011-10-23 ENCOUNTER — Ambulatory Visit: Payer: Medicare Other | Admitting: Internal Medicine

## 2011-10-23 DIAGNOSIS — J309 Allergic rhinitis, unspecified: Secondary | ICD-10-CM

## 2011-11-02 ENCOUNTER — Ambulatory Visit (INDEPENDENT_AMBULATORY_CARE_PROVIDER_SITE_OTHER): Payer: Medicare Other

## 2011-11-02 DIAGNOSIS — J309 Allergic rhinitis, unspecified: Secondary | ICD-10-CM

## 2011-11-11 ENCOUNTER — Ambulatory Visit (INDEPENDENT_AMBULATORY_CARE_PROVIDER_SITE_OTHER): Payer: Medicare Other

## 2011-11-11 DIAGNOSIS — J309 Allergic rhinitis, unspecified: Secondary | ICD-10-CM

## 2011-11-18 ENCOUNTER — Ambulatory Visit (INDEPENDENT_AMBULATORY_CARE_PROVIDER_SITE_OTHER): Payer: Medicare Other

## 2011-11-18 DIAGNOSIS — J309 Allergic rhinitis, unspecified: Secondary | ICD-10-CM

## 2011-11-25 ENCOUNTER — Ambulatory Visit (INDEPENDENT_AMBULATORY_CARE_PROVIDER_SITE_OTHER): Payer: Medicare Other

## 2011-11-25 DIAGNOSIS — J309 Allergic rhinitis, unspecified: Secondary | ICD-10-CM

## 2011-12-03 ENCOUNTER — Ambulatory Visit (INDEPENDENT_AMBULATORY_CARE_PROVIDER_SITE_OTHER): Payer: Medicare Other

## 2011-12-03 ENCOUNTER — Encounter: Payer: Self-pay | Admitting: Internal Medicine

## 2011-12-03 ENCOUNTER — Ambulatory Visit (INDEPENDENT_AMBULATORY_CARE_PROVIDER_SITE_OTHER): Payer: Medicare Other | Admitting: Internal Medicine

## 2011-12-03 VITALS — BP 106/76 | HR 75 | Ht 67.0 in | Wt 114.0 lb

## 2011-12-03 DIAGNOSIS — J310 Chronic rhinitis: Secondary | ICD-10-CM

## 2011-12-03 DIAGNOSIS — J309 Allergic rhinitis, unspecified: Secondary | ICD-10-CM

## 2011-12-03 NOTE — Patient Instructions (Addendum)
We will have the allergy lab advance the strength of your allergy shots next time new vaccine is ordered  Sample Dymista nasal spray    1-2 puffs each nostril once daily at bedtime               If helpful, we can send a script. If not helpful, we can send a script to try a different nasal spray - Ipratropium

## 2011-12-03 NOTE — Progress Notes (Signed)
Patient ID: Sara Beasley, female    DOB: 1946-02-08, 66 y.o.   MRN: [id number]  HPI  HPI 08/22/10- 15 yoF remote smoker, referred courtesy of Dr Haroldine Laws for allergy evaluation of chronic / recurrent rhinosinusitis. She had no such problems before 5 years ago. At that time she developed nasal congestion and an apparent sinus infection after helping move her mother out of a dusty apartment. Symptoms cleared partially in the summer. Dr Haroldine Laws did sinus surgery 2 years ago- septoplasty/ turbinate reduction. She drained well after that, but continued to get apparent infections. Serial antibiotics. Usually feels congested, worse in winter. Nasal sprays and saline rinses have been little help. Usually does not feel that she is catching a cold at onset and does not get chest congestion, cough or wheeze, ear pain or itching/ sneezing. Has never recognized seasonal allergic rhinitis.No hx of asthma or ear infections.   10/03/10 - chronic/ recurrent rhinosinusitis Allergy profile last visit- IgE 60 with significant grass and weed elevations. She sees winter predominance of symptoms, finding little effect of pollen and no benefit from singulair or allegra. She thinks infection is fading, with little discolored any more. Gets fairly regular postnasal drip that will trigger some cough.   12/19/10  64 yoF remote smoker, referred courtesy of Dr Haroldine Laws for allergy evaluation of chronic / recurrent rhinosinusitis. She called wanting treatment before hiking trip.  She reported trying a series of  remedies. She is now doing Singulair, Nasonex and Sudafed 1/2 x 240mg . Notes drainage and cough with yellow and throat clearing, rather than stuffiness. It is much less intense than it was.  Hx  of bronchopneumonia in 2004 by CT and CXR, with no f/u since.  Allergy profile had shown IgE 60 with elevations. She thinks she may be interested in skin testing in the future.   01/19/11-  64 yoF remote smoker, referred  courtesy of Dr Haroldine Laws for allergy evaluation of chronic / recurrent rhinosinusitis. Since last here had vacationed in [address] with wetter weather and felt more nasal congestion. Now in last few days she has been off meds for allergy testing, and weather is much cooler. She notices more runny, stuffy nose, throat clearing and sinus pressure. Chest not changed-ok.  Skin test-significant positive reactions for grass, tree, and weed pollens, dust mite, cockroach. She considers winter her worst season. We agreed to have her go back on her current maintenance medicines and see how she does this winter. We did in talk in detail about the possibility of allergy vaccine including realistic descriptions of logistics, risks and benefits.  05/22/11- 64 yoF remote smoker, referred courtesy of Dr Haroldine Laws for allergy evaluation of chronic / recurrent rhinosinusitis. She is hopeful allergy vaccine is contributing to the improvement she notices. She recognizes also the drier weather is helpful. Has not had a significant respiratory infection this winter. Still taking Allegra. Did have a sinusitis in October treated with Cleocin. Nasal discharge is still occasionally a little purulent with some blood but no pain or fever. She denies headache. Off of all nasal sprays now. Daily cough starts about 6 p.m. Occasional use of saline nasal rinse.  12/03/11- 64 yoF remote smoker, referred courtesy of Dr Haroldine Laws for allergy evaluation of chronic / recurrent rhinosinusitis Still on vaccine 1:50 GH and doing well; unable to tell any difference if using allegra or Zyrtec; no sinus infection in about 8 months!!!! Still having cough and drainage though-starts late morning and will last about every 2-3  hours-not much trouble at night. She likes the way she is doing now and doesn't want to "rock the boat".  Review of Systems- See HPI Constitutional:   No weight loss, night sweats,  Fevers, chills, fatigue, lassitude. HEENT:  No headaches,  Difficulty swallowing,  Tooth/dental problems,  Sore throat,                No sneezing, itching, ear ache, CV:  No chest pain,  Orthopnea, PND, swelling in lower extremities, anasarca, dizziness, palpitations GI  No heartburn, indigestion, abdominal pain, nausea, vomiting,  Resp: No shortness of breath with exertion or at rest.  No excess mucus, no productive cough,  Minimal non-productive cough,  No coughing up of blood.  No change in color of mucus.  No wheezing Skin: no rash or lesions. GU: MS:  No joint pain or swelling.  No decreased range of motion.  No back pain. Psych:  No change in mood or affect. No depression or anxiety.  No memory loss.  Objective:   Physical Exam General- Alert, Oriented, Affect-appropriate, Distress- none acute,  Slender/ thin chest Skin- rash-none, lesions- none, excoriation- none Lymphadenopathy- none Head- atraumatic            Eyes- Gross vision intact, PERRLA, conjunctivae clear secretions            Ears- Hearing, canals normal            Nose- clear., no-Septal dev,  polyps, erosion, perforation;   mucus bridging            Throat- Mallampati II , mucosa clear, dry;  drainage- none, tonsils- atrophic. +Repeated throat clearing. Neck- flexible , trachea midline, no stridor , thyroid nl, carotid no bruit Chest - symmetrical excursion , unlabored           Heart/CV- RRR , no murmur , no gallop  , no rub, nl s1 s2                           - JVD- none , edema- none, stasis changes- none, varices- none           Lung- clear to P&A, slight squeak cleared quickly, wheeze- none, cough- none , dullness-none, rub- none           Chest wall-  Abd- Br/ Gen/ Rectal- Not done, not indicated Extrem- cyanosis- none, clubbing, none, atrophy- none, strength- nl Neuro- grossly intact to observation

## 2011-12-09 NOTE — Assessment & Plan Note (Signed)
Plan-sample Dymista.  Advance allergy vaccine to 1:10 next order

## 2011-12-10 ENCOUNTER — Ambulatory Visit (INDEPENDENT_AMBULATORY_CARE_PROVIDER_SITE_OTHER): Payer: Medicare Other

## 2011-12-10 DIAGNOSIS — J309 Allergic rhinitis, unspecified: Secondary | ICD-10-CM

## 2011-12-18 ENCOUNTER — Ambulatory Visit (INDEPENDENT_AMBULATORY_CARE_PROVIDER_SITE_OTHER): Payer: Medicare Other

## 2011-12-18 ENCOUNTER — Telehealth: Payer: Self-pay | Admitting: Internal Medicine

## 2011-12-18 DIAGNOSIS — J309 Allergic rhinitis, unspecified: Secondary | ICD-10-CM

## 2011-12-18 MED ORDER — IPRATROPIUM BROMIDE 0.06 % NA SOLN: NASAL | Status: DC

## 2011-12-18 NOTE — Telephone Encounter (Signed)
Left message for patient on number that Rx has been sent to pharmacy and if any questions or concerns please call the office.

## 2011-12-18 NOTE — Telephone Encounter (Signed)
Offer script ipratropium 0.06% nasal spray, 1 bottle, 1-2 puffs each nostril every 8 hours if needed, refill prn

## 2011-12-18 NOTE — Telephone Encounter (Signed)
Pt came by the office to get allergy shot-states that dymista sample given at last visit is not helping and would like to have something else called to pharmacy. Thanks.

## 2011-12-25 ENCOUNTER — Ambulatory Visit (INDEPENDENT_AMBULATORY_CARE_PROVIDER_SITE_OTHER): Payer: Medicare Other

## 2011-12-25 DIAGNOSIS — J309 Allergic rhinitis, unspecified: Secondary | ICD-10-CM

## 2011-12-30 ENCOUNTER — Encounter: Payer: Self-pay | Admitting: Internal Medicine

## 2012-01-01 ENCOUNTER — Ambulatory Visit (INDEPENDENT_AMBULATORY_CARE_PROVIDER_SITE_OTHER): Payer: Medicare Other

## 2012-01-01 DIAGNOSIS — J309 Allergic rhinitis, unspecified: Secondary | ICD-10-CM

## 2012-01-05 ENCOUNTER — Ambulatory Visit (INDEPENDENT_AMBULATORY_CARE_PROVIDER_SITE_OTHER): Payer: Medicare Other

## 2012-01-05 DIAGNOSIS — J309 Allergic rhinitis, unspecified: Secondary | ICD-10-CM

## 2012-01-08 ENCOUNTER — Telehealth: Payer: Self-pay | Admitting: Internal Medicine

## 2012-01-08 ENCOUNTER — Ambulatory Visit (INDEPENDENT_AMBULATORY_CARE_PROVIDER_SITE_OTHER): Payer: Medicare Other

## 2012-01-08 DIAGNOSIS — J309 Allergic rhinitis, unspecified: Secondary | ICD-10-CM

## 2012-01-08 NOTE — Telephone Encounter (Signed)
lmomtcb x1 

## 2012-01-11 NOTE — Telephone Encounter (Signed)
Per CY-try nasalcrom OTC

## 2012-01-11 NOTE — Telephone Encounter (Signed)
Spoke with pt and notified of recs per CDY Pt verbalized understanding and states nothing further needed  

## 2012-01-11 NOTE — Telephone Encounter (Signed)
Last OV: 12/03/2011, ROV in 1 year. I spoke with the pt and she states that she tried the Dymista but it caused nose bleeds and her to have a sore throat so she stopped it and is just using saline. She states the saline is not helping her symptoms, which are nasal drainage, sore throat, and cough. Pt ask if there is any other nasal spray she could use that may help, but not cause side effects. Please advise. Carron Curie, CMA Allergies  Allergen Reactions  . Sulfa Antibiotics

## 2012-01-11 NOTE — Telephone Encounter (Signed)
LMTCBx2. Avianah Pellman, CMA  

## 2012-01-15 ENCOUNTER — Ambulatory Visit (INDEPENDENT_AMBULATORY_CARE_PROVIDER_SITE_OTHER): Payer: Medicare Other

## 2012-01-15 DIAGNOSIS — J309 Allergic rhinitis, unspecified: Secondary | ICD-10-CM

## 2012-01-25 ENCOUNTER — Ambulatory Visit (INDEPENDENT_AMBULATORY_CARE_PROVIDER_SITE_OTHER): Payer: Medicare Other

## 2012-01-25 DIAGNOSIS — Z23 Encounter for immunization: Secondary | ICD-10-CM

## 2012-01-25 DIAGNOSIS — J309 Allergic rhinitis, unspecified: Secondary | ICD-10-CM

## 2012-02-02 ENCOUNTER — Ambulatory Visit (INDEPENDENT_AMBULATORY_CARE_PROVIDER_SITE_OTHER): Payer: Medicare Other

## 2012-02-02 DIAGNOSIS — J309 Allergic rhinitis, unspecified: Secondary | ICD-10-CM

## 2012-02-02 DIAGNOSIS — Z23 Encounter for immunization: Secondary | ICD-10-CM

## 2012-02-10 ENCOUNTER — Ambulatory Visit (INDEPENDENT_AMBULATORY_CARE_PROVIDER_SITE_OTHER): Payer: Medicare Other

## 2012-02-10 DIAGNOSIS — J309 Allergic rhinitis, unspecified: Secondary | ICD-10-CM

## 2012-02-12 ENCOUNTER — Telehealth: Payer: Self-pay | Admitting: Internal Medicine

## 2012-02-12 NOTE — Telephone Encounter (Signed)
Pt dropped off a note that reads the following: MEDS- AM: ZYRTEC. VITAMIN D, CALCIUM, MULTIVITAMIN x3/WEEK, WEEKLY ALLERGY INJECTIONS : D/C'd MEDS: SINGULAIR, (could not see clear benefit), off this about 3 wks-no adverse consequences noted, NASOCROM-could not see benefit-2 weeks ago-no changes noted, IPRATROPIUM BROMIDE NASAL SOLUTION- today:  I had use the Ipratropium Bromide several weeks ago. I stopped it, and reported this to you, because of nasal and throat discomfort. Thought I would give it another try, so I started using it again 10 days ago. Last night I woke up with a lot of congestion in my nose / throat, wheezing then coughing, then throat clearing (mostly throat clearing) nasal congestion and drainage in throat. Drank water, blew nose to clear my throat. I was up "about 20 mins. Don't wish to repeat this. I have gone back to nasal saline solution and only the meds listed above. In general am doing much better, especially in the last 5-6 days. Perhaps it's due to the change in the injections. What are your recs? Call me (or ask your nurse to call) at [phone]- ok to leave message. Thank you.

## 2012-02-12 NOTE — Telephone Encounter (Signed)
Per CY---stick with saline nasal spray through the weekend at least.  Suspect regular use of the ipratropium nasal spray overdried the nose.  Called and lmom to make the pt aware.  Pt was advised in the message to call back for any questions or concerns.

## 2012-02-16 ENCOUNTER — Ambulatory Visit (INDEPENDENT_AMBULATORY_CARE_PROVIDER_SITE_OTHER): Payer: Medicare Other

## 2012-02-16 DIAGNOSIS — J309 Allergic rhinitis, unspecified: Secondary | ICD-10-CM

## 2012-02-26 ENCOUNTER — Ambulatory Visit (INDEPENDENT_AMBULATORY_CARE_PROVIDER_SITE_OTHER): Payer: Medicare Other

## 2012-02-26 DIAGNOSIS — J309 Allergic rhinitis, unspecified: Secondary | ICD-10-CM

## 2012-03-03 ENCOUNTER — Ambulatory Visit (INDEPENDENT_AMBULATORY_CARE_PROVIDER_SITE_OTHER): Payer: Medicare Other

## 2012-03-03 DIAGNOSIS — J309 Allergic rhinitis, unspecified: Secondary | ICD-10-CM

## 2012-03-07 ENCOUNTER — Ambulatory Visit (INDEPENDENT_AMBULATORY_CARE_PROVIDER_SITE_OTHER): Payer: Medicare Other

## 2012-03-07 DIAGNOSIS — J309 Allergic rhinitis, unspecified: Secondary | ICD-10-CM

## 2012-03-15 ENCOUNTER — Ambulatory Visit (INDEPENDENT_AMBULATORY_CARE_PROVIDER_SITE_OTHER): Payer: Medicare Other

## 2012-03-15 DIAGNOSIS — J309 Allergic rhinitis, unspecified: Secondary | ICD-10-CM

## 2012-03-22 ENCOUNTER — Ambulatory Visit (INDEPENDENT_AMBULATORY_CARE_PROVIDER_SITE_OTHER): Payer: Medicare Other

## 2012-03-22 DIAGNOSIS — J309 Allergic rhinitis, unspecified: Secondary | ICD-10-CM

## 2012-03-30 ENCOUNTER — Ambulatory Visit (INDEPENDENT_AMBULATORY_CARE_PROVIDER_SITE_OTHER): Payer: Medicare Other

## 2012-03-30 DIAGNOSIS — J309 Allergic rhinitis, unspecified: Secondary | ICD-10-CM

## 2012-04-06 ENCOUNTER — Ambulatory Visit (INDEPENDENT_AMBULATORY_CARE_PROVIDER_SITE_OTHER): Payer: Medicare Other

## 2012-04-06 DIAGNOSIS — J309 Allergic rhinitis, unspecified: Secondary | ICD-10-CM

## 2012-04-14 ENCOUNTER — Ambulatory Visit (INDEPENDENT_AMBULATORY_CARE_PROVIDER_SITE_OTHER): Payer: Medicare Other

## 2012-04-14 DIAGNOSIS — J309 Allergic rhinitis, unspecified: Secondary | ICD-10-CM

## 2012-04-22 ENCOUNTER — Ambulatory Visit (INDEPENDENT_AMBULATORY_CARE_PROVIDER_SITE_OTHER): Payer: Medicare Other

## 2012-04-22 DIAGNOSIS — J309 Allergic rhinitis, unspecified: Secondary | ICD-10-CM

## 2012-04-26 ENCOUNTER — Encounter: Payer: Self-pay | Admitting: Internal Medicine

## 2012-04-29 ENCOUNTER — Ambulatory Visit (INDEPENDENT_AMBULATORY_CARE_PROVIDER_SITE_OTHER): Payer: Medicare Other

## 2012-04-29 DIAGNOSIS — J309 Allergic rhinitis, unspecified: Secondary | ICD-10-CM

## 2012-05-06 ENCOUNTER — Ambulatory Visit (INDEPENDENT_AMBULATORY_CARE_PROVIDER_SITE_OTHER): Payer: Medicare Other

## 2012-05-06 DIAGNOSIS — J309 Allergic rhinitis, unspecified: Secondary | ICD-10-CM

## 2012-05-10 ENCOUNTER — Ambulatory Visit (INDEPENDENT_AMBULATORY_CARE_PROVIDER_SITE_OTHER): Payer: Medicare Other

## 2012-05-10 DIAGNOSIS — J309 Allergic rhinitis, unspecified: Secondary | ICD-10-CM

## 2012-05-12 ENCOUNTER — Ambulatory Visit: Payer: Medicare Other

## 2012-05-13 ENCOUNTER — Ambulatory Visit (INDEPENDENT_AMBULATORY_CARE_PROVIDER_SITE_OTHER): Payer: Medicare Other

## 2012-05-13 DIAGNOSIS — J309 Allergic rhinitis, unspecified: Secondary | ICD-10-CM

## 2012-05-19 ENCOUNTER — Ambulatory Visit (INDEPENDENT_AMBULATORY_CARE_PROVIDER_SITE_OTHER): Payer: Medicare Other

## 2012-05-19 DIAGNOSIS — J309 Allergic rhinitis, unspecified: Secondary | ICD-10-CM

## 2012-05-27 ENCOUNTER — Ambulatory Visit (INDEPENDENT_AMBULATORY_CARE_PROVIDER_SITE_OTHER): Payer: Medicare Other

## 2012-05-27 DIAGNOSIS — J309 Allergic rhinitis, unspecified: Secondary | ICD-10-CM

## 2012-06-02 ENCOUNTER — Ambulatory Visit (INDEPENDENT_AMBULATORY_CARE_PROVIDER_SITE_OTHER): Payer: Medicare Other

## 2012-06-02 DIAGNOSIS — J309 Allergic rhinitis, unspecified: Secondary | ICD-10-CM

## 2012-06-03 ENCOUNTER — Ambulatory Visit: Payer: Medicare Other

## 2012-06-13 ENCOUNTER — Ambulatory Visit (INDEPENDENT_AMBULATORY_CARE_PROVIDER_SITE_OTHER): Payer: Medicare Other

## 2012-06-13 DIAGNOSIS — J309 Allergic rhinitis, unspecified: Secondary | ICD-10-CM

## 2012-06-14 ENCOUNTER — Ambulatory Visit: Payer: Medicare Other

## 2012-06-22 ENCOUNTER — Ambulatory Visit (INDEPENDENT_AMBULATORY_CARE_PROVIDER_SITE_OTHER): Payer: Medicare Other

## 2012-06-22 DIAGNOSIS — J309 Allergic rhinitis, unspecified: Secondary | ICD-10-CM

## 2012-06-24 ENCOUNTER — Ambulatory Visit: Payer: Medicare Other

## 2012-07-14 ENCOUNTER — Ambulatory Visit (INDEPENDENT_AMBULATORY_CARE_PROVIDER_SITE_OTHER): Payer: Medicare Other

## 2012-07-14 DIAGNOSIS — J309 Allergic rhinitis, unspecified: Secondary | ICD-10-CM

## 2012-07-21 ENCOUNTER — Ambulatory Visit (INDEPENDENT_AMBULATORY_CARE_PROVIDER_SITE_OTHER): Payer: Medicare Other

## 2012-07-21 DIAGNOSIS — J309 Allergic rhinitis, unspecified: Secondary | ICD-10-CM

## 2012-07-28 ENCOUNTER — Ambulatory Visit: Payer: Medicare Other

## 2012-08-02 ENCOUNTER — Ambulatory Visit (INDEPENDENT_AMBULATORY_CARE_PROVIDER_SITE_OTHER): Payer: Medicare Other

## 2012-08-02 DIAGNOSIS — J309 Allergic rhinitis, unspecified: Secondary | ICD-10-CM

## 2012-08-08 ENCOUNTER — Ambulatory Visit (INDEPENDENT_AMBULATORY_CARE_PROVIDER_SITE_OTHER): Payer: Medicare Other

## 2012-08-08 ENCOUNTER — Encounter: Payer: Self-pay | Admitting: Internal Medicine

## 2012-08-08 DIAGNOSIS — J309 Allergic rhinitis, unspecified: Secondary | ICD-10-CM

## 2012-08-11 ENCOUNTER — Ambulatory Visit: Payer: Medicare Other

## 2012-08-16 ENCOUNTER — Ambulatory Visit (INDEPENDENT_AMBULATORY_CARE_PROVIDER_SITE_OTHER): Payer: Medicare Other

## 2012-08-16 DIAGNOSIS — J309 Allergic rhinitis, unspecified: Secondary | ICD-10-CM

## 2012-08-18 ENCOUNTER — Ambulatory Visit: Payer: Medicare Other

## 2012-08-25 ENCOUNTER — Ambulatory Visit (INDEPENDENT_AMBULATORY_CARE_PROVIDER_SITE_OTHER): Payer: Medicare Other

## 2012-08-25 DIAGNOSIS — J309 Allergic rhinitis, unspecified: Secondary | ICD-10-CM

## 2012-09-01 ENCOUNTER — Ambulatory Visit: Payer: Medicare Other

## 2012-09-02 ENCOUNTER — Ambulatory Visit (INDEPENDENT_AMBULATORY_CARE_PROVIDER_SITE_OTHER): Payer: Medicare Other

## 2012-09-02 DIAGNOSIS — J309 Allergic rhinitis, unspecified: Secondary | ICD-10-CM

## 2012-09-09 ENCOUNTER — Ambulatory Visit (INDEPENDENT_AMBULATORY_CARE_PROVIDER_SITE_OTHER): Payer: Medicare Other

## 2012-09-09 DIAGNOSIS — J309 Allergic rhinitis, unspecified: Secondary | ICD-10-CM

## 2012-09-26 ENCOUNTER — Ambulatory Visit (INDEPENDENT_AMBULATORY_CARE_PROVIDER_SITE_OTHER): Payer: Medicare Other

## 2012-09-26 DIAGNOSIS — J309 Allergic rhinitis, unspecified: Secondary | ICD-10-CM

## 2012-10-06 ENCOUNTER — Ambulatory Visit (INDEPENDENT_AMBULATORY_CARE_PROVIDER_SITE_OTHER): Payer: Medicare Other

## 2012-10-06 DIAGNOSIS — J309 Allergic rhinitis, unspecified: Secondary | ICD-10-CM

## 2012-10-13 ENCOUNTER — Ambulatory Visit: Payer: Medicare Other

## 2012-10-18 ENCOUNTER — Ambulatory Visit (INDEPENDENT_AMBULATORY_CARE_PROVIDER_SITE_OTHER): Payer: Medicare Other

## 2012-10-18 DIAGNOSIS — J309 Allergic rhinitis, unspecified: Secondary | ICD-10-CM

## 2012-11-02 ENCOUNTER — Ambulatory Visit (INDEPENDENT_AMBULATORY_CARE_PROVIDER_SITE_OTHER): Payer: Medicare Other

## 2012-11-02 DIAGNOSIS — J309 Allergic rhinitis, unspecified: Secondary | ICD-10-CM

## 2012-11-03 ENCOUNTER — Ambulatory Visit (INDEPENDENT_AMBULATORY_CARE_PROVIDER_SITE_OTHER): Payer: Medicare Other

## 2012-11-03 DIAGNOSIS — J309 Allergic rhinitis, unspecified: Secondary | ICD-10-CM

## 2012-11-11 ENCOUNTER — Ambulatory Visit (INDEPENDENT_AMBULATORY_CARE_PROVIDER_SITE_OTHER): Payer: Medicare Other

## 2012-11-11 DIAGNOSIS — J309 Allergic rhinitis, unspecified: Secondary | ICD-10-CM

## 2012-11-14 ENCOUNTER — Ambulatory Visit: Payer: Medicare Other

## 2012-11-18 ENCOUNTER — Ambulatory Visit (INDEPENDENT_AMBULATORY_CARE_PROVIDER_SITE_OTHER): Payer: Medicare Other

## 2012-11-18 DIAGNOSIS — J309 Allergic rhinitis, unspecified: Secondary | ICD-10-CM

## 2012-11-25 ENCOUNTER — Ambulatory Visit (INDEPENDENT_AMBULATORY_CARE_PROVIDER_SITE_OTHER): Payer: Medicare Other

## 2012-11-25 DIAGNOSIS — J309 Allergic rhinitis, unspecified: Secondary | ICD-10-CM

## 2012-11-29 ENCOUNTER — Ambulatory Visit: Payer: Medicare Other | Admitting: Internal Medicine

## 2012-12-02 ENCOUNTER — Ambulatory Visit: Payer: Medicare Other

## 2012-12-05 ENCOUNTER — Ambulatory Visit (INDEPENDENT_AMBULATORY_CARE_PROVIDER_SITE_OTHER): Payer: Medicare Other

## 2012-12-05 DIAGNOSIS — J309 Allergic rhinitis, unspecified: Secondary | ICD-10-CM

## 2012-12-12 ENCOUNTER — Ambulatory Visit (INDEPENDENT_AMBULATORY_CARE_PROVIDER_SITE_OTHER): Payer: Medicare Other

## 2012-12-12 DIAGNOSIS — J309 Allergic rhinitis, unspecified: Secondary | ICD-10-CM

## 2012-12-19 ENCOUNTER — Ambulatory Visit: Payer: Medicare Other

## 2012-12-23 ENCOUNTER — Ambulatory Visit (INDEPENDENT_AMBULATORY_CARE_PROVIDER_SITE_OTHER): Payer: Medicare Other

## 2012-12-23 DIAGNOSIS — J309 Allergic rhinitis, unspecified: Secondary | ICD-10-CM

## 2012-12-28 ENCOUNTER — Ambulatory Visit (INDEPENDENT_AMBULATORY_CARE_PROVIDER_SITE_OTHER): Payer: Medicare Other

## 2012-12-28 DIAGNOSIS — J309 Allergic rhinitis, unspecified: Secondary | ICD-10-CM

## 2012-12-30 ENCOUNTER — Ambulatory Visit: Payer: Medicare Other

## 2013-01-06 ENCOUNTER — Ambulatory Visit (INDEPENDENT_AMBULATORY_CARE_PROVIDER_SITE_OTHER): Payer: Medicare Other

## 2013-01-06 DIAGNOSIS — J309 Allergic rhinitis, unspecified: Secondary | ICD-10-CM

## 2013-01-11 ENCOUNTER — Ambulatory Visit: Payer: Medicare Other | Admitting: Internal Medicine

## 2013-01-13 ENCOUNTER — Ambulatory Visit: Payer: Medicare Other

## 2013-01-16 ENCOUNTER — Ambulatory Visit (INDEPENDENT_AMBULATORY_CARE_PROVIDER_SITE_OTHER): Payer: Medicare Other

## 2013-01-16 DIAGNOSIS — J309 Allergic rhinitis, unspecified: Secondary | ICD-10-CM

## 2013-01-17 ENCOUNTER — Ambulatory Visit: Payer: Medicare Other | Admitting: Internal Medicine

## 2013-01-23 ENCOUNTER — Ambulatory Visit: Payer: Medicare Other

## 2013-01-24 ENCOUNTER — Ambulatory Visit (INDEPENDENT_AMBULATORY_CARE_PROVIDER_SITE_OTHER): Payer: Medicare Other

## 2013-01-24 DIAGNOSIS — J309 Allergic rhinitis, unspecified: Secondary | ICD-10-CM

## 2013-02-03 ENCOUNTER — Ambulatory Visit (INDEPENDENT_AMBULATORY_CARE_PROVIDER_SITE_OTHER): Payer: Medicare Other

## 2013-02-03 DIAGNOSIS — J309 Allergic rhinitis, unspecified: Secondary | ICD-10-CM

## 2013-02-09 ENCOUNTER — Ambulatory Visit (INDEPENDENT_AMBULATORY_CARE_PROVIDER_SITE_OTHER): Payer: Medicare Other

## 2013-02-09 DIAGNOSIS — J309 Allergic rhinitis, unspecified: Secondary | ICD-10-CM

## 2013-02-10 ENCOUNTER — Ambulatory Visit: Payer: Medicare Other

## 2013-02-17 ENCOUNTER — Ambulatory Visit (INDEPENDENT_AMBULATORY_CARE_PROVIDER_SITE_OTHER): Payer: Medicare Other

## 2013-02-17 DIAGNOSIS — J309 Allergic rhinitis, unspecified: Secondary | ICD-10-CM

## 2013-02-17 DIAGNOSIS — Z23 Encounter for immunization: Secondary | ICD-10-CM

## 2013-02-24 ENCOUNTER — Ambulatory Visit (INDEPENDENT_AMBULATORY_CARE_PROVIDER_SITE_OTHER): Payer: Medicare Other

## 2013-02-24 ENCOUNTER — Encounter: Payer: Self-pay | Admitting: Internal Medicine

## 2013-02-24 ENCOUNTER — Other Ambulatory Visit: Payer: Medicare Other

## 2013-02-24 ENCOUNTER — Ambulatory Visit (INDEPENDENT_AMBULATORY_CARE_PROVIDER_SITE_OTHER): Payer: Medicare Other | Admitting: Internal Medicine

## 2013-02-24 VITALS — BP 100/64 | HR 82 | Ht 67.0 in | Wt 111.4 lb

## 2013-02-24 DIAGNOSIS — J310 Chronic rhinitis: Secondary | ICD-10-CM

## 2013-02-24 DIAGNOSIS — J309 Allergic rhinitis, unspecified: Secondary | ICD-10-CM

## 2013-02-24 MED ORDER — IPRATROPIUM BROMIDE 0.03 % NA SOLN: NASAL | Status: DC

## 2013-02-24 NOTE — Patient Instructions (Signed)
Sample Omnaris nasal steroid spray    1-2 puffs each nostril once daily at bedtime  Script to try ipratropium nasal spray to turn off the water works, when needed  Order- lab Allergy Profile    I will compare these results to your previous skin test results and vaccine make-up.

## 2013-02-24 NOTE — Progress Notes (Signed)
Patient ID: Sara Beasley, female    DOB: 09/12/1945, 67 y.o.   MRN: [id number]  HPI 08/22/10- [age] yoF remote smoker, referred courtesy of Dr Haroldine Lawsrossley for allergy evaluation of chronic / recurrent rhinosinusitis. She had no such problems before 5 years ago. At that time she developed nasal congestion and an apparent sinus infection after helping move her mother out of a dusty apartment. Symptoms cleared partially in the summer. Dr Haroldine Lawsrossley did sinus surgery 2 years ago- septoplasty/ turbinate reduction. She drained well after that, but continued to get apparent infections. Serial antibiotics. Usually feels congested, worse in winter. Nasal sprays and saline rinses have been little help. Usually does not feel that she is catching a cold at onset and does not get chest congestion, cough or wheeze, ear pain or itching/ sneezing. Has never recognized seasonal allergic rhinitis.No hx of asthma or ear infections.   10/03/10 - chronic/ recurrent rhinosinusitis Allergy profile last visit- IgE 60 with significant grass and weed elevations. She sees winter predominance of symptoms, finding little effect of pollen and no benefit from singulair or allegra. She thinks infection is fading, with little discolored any more. Gets fairly regular postnasal drip that will trigger some cough.   12/19/10  64 yoF remote smoker, referred courtesy of Dr Haroldine Lawsrossley for allergy evaluation of chronic / recurrent rhinosinusitis. She called wanting treatment before hiking trip.  She reported trying a series of  remedies. She is now doing Singulair, Nasonex and Sudafed 1/2 x 240mg . Notes drainage and cough with yellow and throat clearing, rather than stuffiness. It is much less intense than it was.  Hx  of bronchopneumonia in 2004 by CT and CXR, with no f/u since.  Allergy profile had shown IgE 60 with elevations. She thinks she may be interested in skin testing in the future.   01/19/11-  64 yoF remote smoker, referred courtesy of  Dr Haroldine Lawsrossley for allergy evaluation of chronic / recurrent rhinosinusitis. [place] and felt more nasal congestion. Now in last few days she has been off meds for allergy testing, and weather is much cooler. She notices more runny, stuffy nose, throat clearing and sinus pressure. Chest not changed-ok.  Skin test-significant positive reactions for grass, tree, and weed pollens, dust mite, cockroach. She considers winter her worst season. We agreed to have her go back on her current maintenance medicines and see how she does this winter. We did in talk in detail about the possibility of allergy vaccine including realistic descriptions of logistics, risks and benefits.  05/22/11- 64 yoF remote smoker, referred courtesy of Dr Haroldine Lawsrossley for allergy evaluation of chronic / recurrent rhinosinusitis. She is hopeful allergy vaccine is contributing to the improvement she notices. She recognizes also the drier weather is helpful. Has not had a significant respiratory infection this winter. Still taking Allegra. Did have a sinusitis in October treated with Cleocin. Nasal discharge is still occasionally a little purulent with some blood but no pain or fever. She denies headache. Off of all nasal sprays now. Daily cough starts about 6 p.m. Occasional use of saline nasal rinse.  12/03/11- 64 yoF remote smoker, referred courtesy of Dr Haroldine Lawsrossley for allergy evaluation of chronic / recurrent rhinosinusitis Still on vaccine 1:50 GH and doing well; unable to tell any difference if using allegra or Zyrtec; no sinus infection in about 8 months!!!! Still having cough and drainage though-starts late morning and will last about every 2-3 hours-not much  trouble at night. She likes the way she is doing now and doesn't want to "rock the boat".  02/24/13- 67 yoF remote smoker, referred courtesy of Dr Haroldine Laws for allergy evaluation of chronic / recurrent rhinosinusitis FOLLOWS  FOR: still on Allergy vaccine 1:50 GH; can't tell if its helping much, spring and fall are the worst times of year for paitient. Increased drainage and nasal congestion. Rhinitis is perennial, not just in grass pollen season. Allergy profile had been significant for elevated grass pollen IgE. She saw Dr. Haroldine Laws for one sinus infection in the fall. He increase Singulair to 10 mg daily. She disliked Dymista. Nasal sprays are "harsh". We discussed [place].   Review of Systems- See HPI Constitutional:   No weight loss, night sweats,  Fevers, chills, fatigue, lassitude. HEENT: No headaches,  Difficulty swallowing,  Tooth/dental problems,  Sore throat,                +No sneezing, itching, ear ache, CV:  No chest pain,  Orthopnea, PND, swelling in lower extremities, anasarca, dizziness, palpitations GI  No heartburn, indigestion, abdominal pain, nausea, vomiting,  Resp: No shortness of breath with exertion or at rest.  No excess mucus, no productive cough,  Minimal non-productive cough,  No coughing up of blood.  No change in color of mucus.  No wheezing Skin: no rash or lesions. GU: MS:  No joint pain or swelling.  No decreased range of motion.  No back pain. Psych:  No change in mood or affect. No depression or anxiety.  No memory loss.  Objective:   Physical Exam General- Alert, Oriented, Affect-appropriate, Distress- none acute,  Slender/ thin chest Skin- rash-none, lesions- none, excoriation- none Lymphadenopathy- none Head- atraumatic            Eyes- Gross vision intact, PERRLA, conjunctivae clear secretions            Ears- Hearing, canals normal            Nose- clear., no-Septal dev,  polyps, erosion, perforation;   mucus bridging            Throat- Mallampati II , mucosa clear, dry;  drainage- none, tonsils- atrophic.  Neck- flexible , trachea midline, no stridor , thyroid nl, carotid no bruit Chest - symmetrical excursion , unlabored           Heart/CV- RRR , no murmur , no gallop   , no rub, nl s1 s2                           - JVD- none , edema- none, stasis changes- none, varices- none           Lung- clear to P&A, wheeze- none, cough- none , dullness-none, rub- none           Chest wall-  Abd- Br/ Gen/ Rectal- Not done, not indicated Extrem- cyanosis- none, clubbing, none, atrophy- none, strength- nl Neuro- grossly intact to observation

## 2013-02-27 LAB — ALLERGY FULL PROFILE
Allergen, D pternoyssinus,d7: <0.1 kU/L
Allergen,Goose feathers, e70: <0.1 kU/L
Alternaria Alternata: <0.1 kU/L
Aspergillus fumigatus, m3: <0.1 kU/L
Bahia Grass: 4.54 kU/L — ABNORMAL HIGH
Bermuda Grass: 5.59 kU/L — ABNORMAL HIGH
Box Elder IgE: 2.74 kU/L — ABNORMAL HIGH
Candida Albicans: <0.1 kU/L
Cat Dander: <0.1 kU/L
Common Ragweed: 5.11 kU/L — ABNORMAL HIGH
Curvularia lunata: <0.1 kU/L
D. farinae: <0.1 kU/L
Dog Dander: <0.1 kU/L
Elm IgE: 2.6 kU/L — ABNORMAL HIGH
Fescue: 4.08 kU/L — ABNORMAL HIGH
G005 Rye, Perennial: 4.85 kU/L — ABNORMAL HIGH
G009 Red Top: 4.44 kU/L — ABNORMAL HIGH
Goldenrod: 2.68 kU/L — ABNORMAL HIGH
Helminthosporium halodes: <0.1 kU/L
House Dust Hollister: <0.1 kU/L
IgE (Immunoglobulin E), Serum: 53.3 IU/mL (ref 0.0–180.0)
Lamb's Quarters: 4.43 kU/L — ABNORMAL HIGH
Oak: 2.43 kU/L — ABNORMAL HIGH
Plantain: 3.45 kU/L — ABNORMAL HIGH
Stemphylium Botryosum: <0.1 kU/L
Sycamore Tree: 2.45 kU/L — ABNORMAL HIGH
Timothy Grass: 4.12 kU/L — ABNORMAL HIGH

## 2013-03-02 ENCOUNTER — Telehealth: Payer: Self-pay | Admitting: Internal Medicine

## 2013-03-02 NOTE — Telephone Encounter (Signed)
Notes Recorded by Waymon Budge, MD on 02/28/2013 at 8:46 AM Allergy profile- Still strong antibody levels against grass, tree and weed pollens. It may be worthwhile going to a higher strength on the allergy vaccine, but we don't need to remake it to a different prescription.  [id number]. Carron Curie, CMA

## 2013-03-03 ENCOUNTER — Ambulatory Visit (INDEPENDENT_AMBULATORY_CARE_PROVIDER_SITE_OTHER): Payer: Medicare Other

## 2013-03-03 DIAGNOSIS — J309 Allergic rhinitis, unspecified: Secondary | ICD-10-CM

## 2013-03-03 NOTE — Telephone Encounter (Signed)
Pt aware of results. Spoke with CY about increasing dose amount for patients allergy vaccine.Per CY-we will keep patient at 1:10 strength but move dose to 0.58ml every week. I have documented this on patients vaccine sheet in allergy.

## 2013-03-03 NOTE — Telephone Encounter (Signed)
lmomtcb x2 for pt 

## 2013-03-10 ENCOUNTER — Ambulatory Visit (INDEPENDENT_AMBULATORY_CARE_PROVIDER_SITE_OTHER): Payer: Medicare Other

## 2013-03-10 DIAGNOSIS — J309 Allergic rhinitis, unspecified: Secondary | ICD-10-CM

## 2013-03-12 NOTE — Assessment & Plan Note (Signed)
Plan-sample Omnaris nasal spray, script for ipratropium, lab for allergy profile to compare with previous studies

## 2013-03-13 ENCOUNTER — Ambulatory Visit (INDEPENDENT_AMBULATORY_CARE_PROVIDER_SITE_OTHER): Payer: Medicare Other

## 2013-03-13 DIAGNOSIS — J309 Allergic rhinitis, unspecified: Secondary | ICD-10-CM

## 2013-03-17 ENCOUNTER — Ambulatory Visit: Payer: Medicare Other

## 2013-03-20 ENCOUNTER — Ambulatory Visit (INDEPENDENT_AMBULATORY_CARE_PROVIDER_SITE_OTHER): Payer: Medicare Other

## 2013-03-20 DIAGNOSIS — J309 Allergic rhinitis, unspecified: Secondary | ICD-10-CM

## 2013-03-30 ENCOUNTER — Ambulatory Visit (INDEPENDENT_AMBULATORY_CARE_PROVIDER_SITE_OTHER): Payer: Medicare Other

## 2013-03-30 DIAGNOSIS — J309 Allergic rhinitis, unspecified: Secondary | ICD-10-CM

## 2013-03-31 ENCOUNTER — Ambulatory Visit: Payer: Medicare Other

## 2013-04-06 ENCOUNTER — Ambulatory Visit: Payer: Medicare Other

## 2013-04-14 ENCOUNTER — Ambulatory Visit (INDEPENDENT_AMBULATORY_CARE_PROVIDER_SITE_OTHER): Payer: Medicare Other

## 2013-04-14 DIAGNOSIS — J309 Allergic rhinitis, unspecified: Secondary | ICD-10-CM

## 2013-04-21 ENCOUNTER — Ambulatory Visit: Payer: Medicare Other

## 2013-04-28 ENCOUNTER — Ambulatory Visit (INDEPENDENT_AMBULATORY_CARE_PROVIDER_SITE_OTHER): Payer: Medicare Other

## 2013-04-28 DIAGNOSIS — J309 Allergic rhinitis, unspecified: Secondary | ICD-10-CM

## 2013-05-05 ENCOUNTER — Ambulatory Visit: Payer: Medicare Other

## 2013-05-09 ENCOUNTER — Ambulatory Visit (INDEPENDENT_AMBULATORY_CARE_PROVIDER_SITE_OTHER): Payer: Medicare Other

## 2013-05-09 DIAGNOSIS — J309 Allergic rhinitis, unspecified: Secondary | ICD-10-CM

## 2013-05-17 ENCOUNTER — Ambulatory Visit (INDEPENDENT_AMBULATORY_CARE_PROVIDER_SITE_OTHER): Payer: Medicare Other

## 2013-05-17 DIAGNOSIS — J309 Allergic rhinitis, unspecified: Secondary | ICD-10-CM

## 2013-05-24 ENCOUNTER — Ambulatory Visit (INDEPENDENT_AMBULATORY_CARE_PROVIDER_SITE_OTHER): Payer: Medicare Other

## 2013-05-24 DIAGNOSIS — J309 Allergic rhinitis, unspecified: Secondary | ICD-10-CM

## 2013-05-31 ENCOUNTER — Ambulatory Visit (INDEPENDENT_AMBULATORY_CARE_PROVIDER_SITE_OTHER): Payer: Medicare Other

## 2013-05-31 DIAGNOSIS — J309 Allergic rhinitis, unspecified: Secondary | ICD-10-CM

## 2013-06-06 ENCOUNTER — Telehealth: Payer: Self-pay | Admitting: Internal Medicine

## 2013-06-06 ENCOUNTER — Ambulatory Visit (INDEPENDENT_AMBULATORY_CARE_PROVIDER_SITE_OTHER): Payer: Medicare Other

## 2013-06-06 DIAGNOSIS — J309 Allergic rhinitis, unspecified: Secondary | ICD-10-CM

## 2013-06-06 NOTE — Telephone Encounter (Signed)
lmomtcb x1 for pt 

## 2013-06-06 NOTE — Telephone Encounter (Signed)
Duplicate message. See prior phone note open

## 2013-06-07 MED ORDER — FLUTICASONE FUROATE-VILANTEROL 100-25 MCG/INH IN AEPB: 1.0000 | INHALATION_SPRAY | Freq: Every day | RESPIRATORY_TRACT | Status: DC

## 2013-06-07 MED ORDER — BENZONATATE 200 MG PO CAPS: 200.0000 mg | ORAL_CAPSULE | Freq: Three times a day (TID) | ORAL | Status: DC | PRN

## 2013-06-07 NOTE — Telephone Encounter (Signed)
I spoke with the pt and advised of recs. I have sent rx to pharmacy and left sample at front of breo for the pt with a note for the pt to be taught technique.

## 2013-06-07 NOTE — Telephone Encounter (Signed)
Returning call.Sara Beasley ° °

## 2013-06-07 NOTE — Telephone Encounter (Signed)
Patient returning call.

## 2013-06-07 NOTE — Telephone Encounter (Signed)
lmomtcb x1 for pt 

## 2013-06-07 NOTE — Telephone Encounter (Signed)
LMOMTCB x 1 

## 2013-06-07 NOTE — Telephone Encounter (Signed)
Last OV 02/24/13.I spoke with the pt and she states she was treated in Jan for a sinus infection and completed a course of zpak and clindamycin. She states that is cleared but she is still having a lot chest congestion. She states this has continued to get worse. She is also having a lot of PND, dry cough, throat clearing. Pt is asking for an appt. I offered tomorrow but she is going out of town. We have set an appt for 06-21-13 but pt wants recs in the meantime.   She also states that she wonders if her allergy vaccine needs to be increased. She states she is not feeling any benefits from it at current dose and would like recs for this as well. Please advise. Sara CurieJennifer Raja Beasley, CMA Allergies  Allergen Reactions  . Other     NO MYCIN DRUGS  . Sulfa Antibiotics

## 2013-06-07 NOTE — Telephone Encounter (Signed)
Suggest  1) come by for sample Breo Ellipta inhaler, 1 puff and rinse mouth, once daily  2) Offer Benzonatate perles 200 mg,  1 every 8 hours if needed for cough, # 30, Ref x 1  3) I will check on the allergy vaccine status with the lab.

## 2013-06-16 ENCOUNTER — Ambulatory Visit (INDEPENDENT_AMBULATORY_CARE_PROVIDER_SITE_OTHER): Payer: Medicare Other

## 2013-06-16 DIAGNOSIS — J309 Allergic rhinitis, unspecified: Secondary | ICD-10-CM

## 2013-06-21 ENCOUNTER — Ambulatory Visit (INDEPENDENT_AMBULATORY_CARE_PROVIDER_SITE_OTHER): Payer: Medicare Other | Admitting: Internal Medicine

## 2013-06-21 ENCOUNTER — Ambulatory Visit (INDEPENDENT_AMBULATORY_CARE_PROVIDER_SITE_OTHER)
Admission: RE | Admit: 2013-06-21 | Discharge: 2013-06-21 | Disposition: A | Discharge status: Home / Self Care | Payer: Medicare Other | Source: Ambulatory Visit | Attending: Internal Medicine | Admitting: Internal Medicine

## 2013-06-21 VITALS — BP 142/70 | HR 92 | Ht 67.0 in | Wt 111.2 lb

## 2013-06-21 DIAGNOSIS — J42 Unspecified chronic bronchitis: Secondary | ICD-10-CM

## 2013-06-21 DIAGNOSIS — J310 Chronic rhinitis: Secondary | ICD-10-CM

## 2013-06-21 DIAGNOSIS — R059 Cough, unspecified: Secondary | ICD-10-CM

## 2013-06-21 DIAGNOSIS — R05 Cough: Secondary | ICD-10-CM

## 2013-06-21 NOTE — Patient Instructions (Signed)
Order- CXR   Dx chronic bronchitis  Order- Sputum culture- routine C&S, AFB, Fungal

## 2013-06-21 NOTE — Progress Notes (Signed)
Patient ID: Sara AlbrightBurwell T Houdek, female    DOB: 09/12/1945, 68 y.o.   MRN: [id number]  HPI 08/22/10- [age] yoF remote smoker, referred courtesy of Dr Haroldine Lawsrossley for allergy evaluation of chronic / recurrent rhinosinusitis. She had no such problems before 5 years ago. At that time she developed nasal congestion and an apparent sinus infection after helping move her mother out of a dusty apartment. Symptoms cleared partially in the summer. Dr Haroldine Lawsrossley did sinus surgery 2 years ago- septoplasty/ turbinate reduction. She drained well after that, but continued to get apparent infections. Serial antibiotics. Usually feels congested, worse in winter. Nasal sprays and saline rinses have been little help. Usually does not feel that she is catching a cold at onset and does not get chest congestion, cough or wheeze, ear pain or itching/ sneezing. Has never recognized seasonal allergic rhinitis.No hx of asthma or ear infections.   10/03/10 - chronic/ recurrent rhinosinusitis Allergy profile last visit- IgE 60 with significant grass and weed elevations. She sees winter predominance of symptoms, finding little effect of pollen and no benefit from singulair or allegra. She thinks infection is fading, with little discolored any more. Gets fairly regular postnasal drip that will trigger some cough.   12/19/10  64 yoF remote smoker, referred courtesy of Dr Haroldine Lawsrossley for allergy evaluation of chronic / recurrent rhinosinusitis. She called wanting treatment before hiking trip.  She reported trying a series of  remedies. She is now doing Singulair, Nasonex and Sudafed 1/2 x 240mg . Notes drainage and cough with yellow and throat clearing, rather than stuffiness. It is much less intense than it was.  Hx  of bronchopneumonia in 2004 by CT and CXR, with no f/u since.  Allergy profile had shown IgE 60 with elevations. She thinks she may be interested in skin testing in the future.   01/19/11-  64 yoF remote smoker, referred courtesy of  Dr Haroldine Lawsrossley for allergy evaluation of chronic / recurrent rhinosinusitis. Since last here [place] weather and felt more nasal congestion. Now in last few days she has been off meds for allergy testing, and weather is much cooler. She notices more runny, stuffy nose, throat clearing and sinus pressure. Chest not changed-ok.  Skin test-significant positive reactions for grass, tree, and weed pollens, dust mite, cockroach. She considers winter her worst season. We agreed to have her go back on her current maintenance medicines and see how she does this winter. We did in talk in detail about the possibility of allergy vaccine including realistic descriptions of logistics, risks and benefits.  05/22/11- 64 yoF remote smoker, referred courtesy of Dr Haroldine Lawsrossley for allergy evaluation of chronic / recurrent rhinosinusitis. She is hopeful allergy vaccine is contributing to the improvement she notices. She recognizes also the drier weather is helpful. Has not had a significant respiratory infection this winter. Still taking Allegra. Did have a sinusitis in October treated with Cleocin. Nasal discharge is still occasionally a little purulent with some blood but no pain or fever. She denies headache. Off of all nasal sprays now. Daily cough starts about 6 p.m. Occasional use of saline nasal rinse.  12/03/11- 64 yoF remote smoker, referred courtesy of Dr Haroldine Lawsrossley for allergy evaluation of chronic / recurrent rhinosinusitis Still on vaccine 1:50 GH and doing well; unable to tell any difference if using allegra or Zyrtec; no sinus infection in about 8 months!!!! Still having cough and drainage though-starts late morning and will last about every 2-3 hours-not much  trouble at night. She likes the way she is doing now and doesn't want to "rock the boat".  02/24/13- 67 yoF remote smoker, referred courtesy of Dr Haroldine Lawsrossley for allergy evaluation of chronic / recurrent rhinosinusitis FOLLOWS  FOR: still on Allergy vaccine 1:50 GH; can't tell if its helping much, spring and fall are the worst times of year for paitient. Increased drainage and nasal congestion. Rhinitis is perennial, not just in grass pollen season. Allergy profile had been significant for elevated grass pollen IgE. She saw Dr. Haroldine Lawsrossley for one sinus infection in the fall. He increase Singulair to 10 mg daily. She disliked Dymista. Nasal sprays are "harsh". We discussed Grastek.   05/2513- 67 yoF remote smoker, referred courtesy of Dr Haroldine Lawsrossley for allergy evaluation of chronic / recurrent rhinosinusitis Follows For: Treated for sinus infection in December with Zpak then Clindamycin - Still c/o prod cough (yellow- green) - Chest congestion - Sinus drainage (occas green) - Some SOB on exertion - Denies wheezing, fever, sweat, nodes. Carries kleenex to blow nose. Continue Allergy Vaccine 1:10 GO. Allergy Profile 02/24/13- Total IgE 53.3, positive esp grass and tree pollens  Review of Systems- See HPI Constitutional:   No weight loss, night sweats,  Fevers, chills, fatigue, lassitude. HEENT: No headaches,  Difficulty swallowing,  Tooth/dental problems,  Sore throat,                +No sneezing, itching, ear ache, CV:  No chest pain, orthopnea, PND, swelling in lower extremities, anasarca, dizziness, palpitations GI  No heartburn, indigestion, abdominal pain, nausea, vomiting,  Resp: No shortness of breath with exertion or at rest.  No excess mucus, + productive cough,                        Minimal non-productive cough,  No coughing up of blood.  No change in color of mucus.  No               wheezing Skin: no rash or lesions. GU: MS:  No joint pain or swelling.  No decreased range of motion.  No back pain. Psych:  No change in mood or affect. No depression or anxiety.  No memory loss.  Objective:   Physical Exam General- Alert, Oriented, Affect-appropriate, Distress- none acute,  Slender/ thin chest Skin- rash-none,  lesions- none, excoriation- none Lymphadenopathy- none Head- atraumatic            Eyes- Gross vision intact, PERRLA, conjunctivae clear secretions            Ears- Hearing, canals normal            Nose- clear., no-Septal dev,  polyps, erosion, perforation;   mucus bridging            Throat- Mallampati II , mucosa clear, dry;  drainage- none, tonsils- atrophic.  Neck- flexible , trachea midline, no stridor , thyroid nl, carotid no bruit Chest - symmetrical excursion , unlabored           Heart/CV- RRR , no murmur , no gallop  , no rub, nl s1 s2                           - JVD- none , edema- none, stasis changes- none, varices- none           Lung- +faint crackle, wheeze- none, cough- none , dullness-none, rub- none  Chest wall-  Abd- Br/ Gen/ Rectal- Not done, not indicated Extrem- cyanosis- none, clubbing, none, atrophy- none, strength- nl Neuro- grossly intact to observation

## 2013-06-23 ENCOUNTER — Other Ambulatory Visit: Payer: Medicare Other

## 2013-06-23 ENCOUNTER — Ambulatory Visit (INDEPENDENT_AMBULATORY_CARE_PROVIDER_SITE_OTHER): Payer: Medicare Other

## 2013-06-23 DIAGNOSIS — J309 Allergic rhinitis, unspecified: Secondary | ICD-10-CM

## 2013-06-23 DIAGNOSIS — J42 Unspecified chronic bronchitis: Secondary | ICD-10-CM

## 2013-06-26 ENCOUNTER — Telehealth: Payer: Self-pay | Admitting: Internal Medicine

## 2013-06-26 DIAGNOSIS — J8410 Pulmonary fibrosis, unspecified: Secondary | ICD-10-CM

## 2013-06-26 LAB — RESPIRATORY CULTURE OR RESPIRATORY AND SPUTUM CULTURE

## 2013-06-26 NOTE — Telephone Encounter (Signed)
Pt is aware of results. Orders will be placed for CT and labs per CY.

## 2013-06-28 ENCOUNTER — Other Ambulatory Visit (INDEPENDENT_AMBULATORY_CARE_PROVIDER_SITE_OTHER): Payer: Medicare Other

## 2013-06-28 ENCOUNTER — Ambulatory Visit (INDEPENDENT_AMBULATORY_CARE_PROVIDER_SITE_OTHER): Payer: Medicare Other

## 2013-06-28 ENCOUNTER — Ambulatory Visit (INDEPENDENT_AMBULATORY_CARE_PROVIDER_SITE_OTHER): Payer: Medicare Other | Admitting: Internal Medicine

## 2013-06-28 DIAGNOSIS — J8410 Pulmonary fibrosis, unspecified: Secondary | ICD-10-CM

## 2013-06-28 DIAGNOSIS — J309 Allergic rhinitis, unspecified: Secondary | ICD-10-CM

## 2013-06-28 LAB — PULMONARY FUNCTION TEST
DL/VA % pred: 74 %
DL/VA: 3.85 ml/min/mmHg/L
DLCO UNC % PRED: 44 %
DLCO unc: 12.47 ml/min/mmHg
FEF 25-75 POST: 1.26 L/s
FEF 25-75 Pre: 1.37 L/sec
FEF2575-%CHANGE-POST: -7 %
FEF2575-%PRED-POST: 58 %
FEF2575-%Pred-Pre: 63 %
FEV1-%Change-Post: -2 %
FEV1-%Pred-Post: 68 %
FEV1-%Pred-Pre: 70 %
FEV1-PRE: 1.83 L
FEV1-Post: 1.78 L
FEV1FVC-%Change-Post: 0 %
FEV1FVC-%PRED-PRE: 98 %
FEV6-%Change-Post: -1 %
FEV6-%Pred-Post: 72 %
FEV6-%Pred-Pre: 73 %
FEV6-POST: 2.37 L
FEV6-Pre: 2.4 L
FEV6FVC-%CHANGE-POST: 0 %
FEV6FVC-%PRED-PRE: 102 %
FEV6FVC-%Pred-Post: 103 %
FVC-%CHANGE-POST: -2 %
FVC-%Pred-Post: 69 %
FVC-%Pred-Pre: 71 %
FVC-Post: 2.38 L
FVC-Pre: 2.43 L
POST FEV1/FVC RATIO: 75 %
PRE FEV1/FVC RATIO: 75 %
PRE FEV6/FVC RATIO: 99 %
Post FEV6/FVC ratio: 100 %
RV % pred: 91 %
RV: 2.1 L
TLC % pred: 76 %
TLC: 4.21 L

## 2013-06-28 LAB — HEPATIC FUNCTION PANEL
ALBUMIN: 3.6 g/dL (ref 3.5–5.2)
ALT: 14 U/L (ref 0–35)
AST: 22 U/L (ref 0–37)
Alkaline Phosphatase: 54 U/L (ref 39–117)
BILIRUBIN TOTAL: 0.5 mg/dL (ref 0.3–1.2)
Bilirubin, Direct: 0 mg/dL (ref 0.0–0.3)
Total Protein: 7.8 g/dL (ref 6.0–8.3)

## 2013-06-28 LAB — CBC WITH DIFFERENTIAL/PLATELET
BASOS PCT: 0.2 % (ref 0.0–3.0)
Basophils Absolute: 0 10*3/uL (ref 0.0–0.1)
EOS PCT: 1.9 % (ref 0.0–5.0)
Eosinophils Absolute: 0.2 10*3/uL (ref 0.0–0.7)
HCT: 38.6 % (ref 36.0–46.0)
HEMOGLOBIN: 12.6 g/dL (ref 12.0–15.0)
LYMPHS PCT: 17.2 % (ref 12.0–46.0)
Lymphs Abs: 1.7 10*3/uL (ref 0.7–4.0)
MCHC: 32.5 g/dL (ref 30.0–36.0)
MCV: 88.1 fl (ref 78.0–100.0)
Monocytes Absolute: 0.7 10*3/uL (ref 0.1–1.0)
Monocytes Relative: 7.5 % (ref 3.0–12.0)
NEUTROS ABS: 7.2 10*3/uL (ref 1.4–7.7)
NEUTROS PCT: 73.2 % (ref 43.0–77.0)
Platelets: 462 10*3/uL — ABNORMAL HIGH (ref 150.0–400.0)
RBC: 4.38 Mil/uL (ref 3.87–5.11)
RDW: 14 % (ref 11.5–14.6)
WBC: 9.9 10*3/uL (ref 4.5–10.5)

## 2013-06-28 LAB — BASIC METABOLIC PANEL
BUN: 19 mg/dL (ref 6–23)
CO2: 27 meq/L (ref 19–32)
Calcium: 9 mg/dL (ref 8.4–10.5)
Chloride: 101 mEq/L (ref 96–112)
Creatinine, Ser: 0.8 mg/dL (ref 0.4–1.2)
GFR: 77.91 mL/min (ref >60.00)
GLUCOSE: 107 mg/dL — AB (ref 70–99)
POTASSIUM: 4.3 meq/L (ref 3.5–5.1)
Sodium: 136 mEq/L (ref 135–145)

## 2013-06-28 NOTE — Progress Notes (Signed)
PFT done today. 

## 2013-06-29 ENCOUNTER — Encounter: Payer: Self-pay | Admitting: Internal Medicine

## 2013-06-29 NOTE — Assessment & Plan Note (Signed)
I am concerned there is more going on, with now a persistent bronchitis story Plan- sputum cultures, CXR

## 2013-06-29 NOTE — Assessment & Plan Note (Signed)
She continues allergy vaccine. We are looking more closely.

## 2013-06-30 ENCOUNTER — Ambulatory Visit (INDEPENDENT_AMBULATORY_CARE_PROVIDER_SITE_OTHER)
Admission: RE | Admit: 2013-06-30 | Discharge: 2013-06-30 | Disposition: A | Discharge status: Home / Self Care | Payer: Medicare Other | Source: Ambulatory Visit | Attending: Internal Medicine | Admitting: Internal Medicine

## 2013-06-30 DIAGNOSIS — J8410 Pulmonary fibrosis, unspecified: Secondary | ICD-10-CM

## 2013-06-30 LAB — QUANTIFERON TB GOLD ASSAY (BLOOD)
Interferon Gamma Release Assay: NEGATIVE
Mitogen value: 6.67 IU/mL
QUANTIFERON TB AG MINUS NIL: 0.02 [IU]/mL
Quantiferon Nil Value: 0.36 IU/mL
TB Ag value: 0.38 IU/mL

## 2013-06-30 MED ORDER — IOHEXOL 300 MG/ML  SOLN
80.0000 mL | Freq: Once | INTRAMUSCULAR | Status: AC | PRN
  Administered 2013-06-30: 80 mL via INTRAVENOUS

## 2013-07-03 ENCOUNTER — Telehealth: Payer: Self-pay | Admitting: Internal Medicine

## 2013-07-03 NOTE — Telephone Encounter (Signed)
I have also left a note in allergy about this as well in case patient comes in for her allergy injection prior to calling me back.

## 2013-07-04 ENCOUNTER — Ambulatory Visit (INDEPENDENT_AMBULATORY_CARE_PROVIDER_SITE_OTHER): Payer: Medicare Other

## 2013-07-04 DIAGNOSIS — J309 Allergic rhinitis, unspecified: Secondary | ICD-10-CM

## 2013-07-04 NOTE — Telephone Encounter (Signed)
Per Katie's request, I am going to send this message to her.

## 2013-07-04 NOTE — Telephone Encounter (Signed)
Left message at number given for patient to call me back after 07-11-13 to discuss getting her in for follow up to discuss labs and CT results with her. Will hold message in my box.

## 2013-07-04 NOTE — Telephone Encounter (Signed)
[facility], wanted her to know that she will be out of town and will return on the 17th,  Call and leave msg @ [phone].Caren GriffinsStanley A Dalton

## 2013-07-07 ENCOUNTER — Ambulatory Visit: Payer: Medicare Other

## 2013-07-11 ENCOUNTER — Ambulatory Visit: Payer: Medicare Other

## 2013-07-12 MED ORDER — DOXYCYCLINE HYCLATE 100 MG PO TABS: 100.0000 mg | ORAL_TABLET | Freq: Two times a day (BID) | ORAL | Status: DC

## 2013-07-12 NOTE — Telephone Encounter (Signed)
Spoke with patient-[facility]. Pt is aware of 07-17-13 appt with CY.

## 2013-07-12 NOTE — Telephone Encounter (Signed)
I spoke with pt.  [facility]. I [facility].

## 2013-07-12 NOTE — Telephone Encounter (Signed)
The transfer never came through from [place]-I have left a message for patient to call me back.

## 2013-07-14 ENCOUNTER — Ambulatory Visit (INDEPENDENT_AMBULATORY_CARE_PROVIDER_SITE_OTHER): Payer: Medicare Other

## 2013-07-14 DIAGNOSIS — J309 Allergic rhinitis, unspecified: Secondary | ICD-10-CM

## 2013-07-16 LAB — FUNGUS CULTURE W SMEAR: Smear Result: NONE SEEN

## 2013-07-17 ENCOUNTER — Ambulatory Visit (INDEPENDENT_AMBULATORY_CARE_PROVIDER_SITE_OTHER): Payer: Medicare Other | Admitting: Internal Medicine

## 2013-07-17 ENCOUNTER — Encounter: Payer: Self-pay | Admitting: Internal Medicine

## 2013-07-17 VITALS — BP 134/68 | HR 88 | Ht 67.0 in | Wt 110.4 lb

## 2013-07-17 DIAGNOSIS — J479 Bronchiectasis, uncomplicated: Secondary | ICD-10-CM

## 2013-07-17 DIAGNOSIS — J310 Chronic rhinitis: Secondary | ICD-10-CM

## 2013-07-17 DIAGNOSIS — J189 Pneumonia, unspecified organism: Secondary | ICD-10-CM

## 2013-07-17 MED ORDER — DOXYCYCLINE HYCLATE 100 MG PO TABS: 100.0000 mg | ORAL_TABLET | Freq: Two times a day (BID) | ORAL | Status: DC

## 2013-07-17 NOTE — Assessment & Plan Note (Signed)
Continue allergy vaccine.  

## 2013-07-17 NOTE — Progress Notes (Signed)
Patient ID: Sara Beasley, female    DOB: 09/12/1945, 68 y.o.   MRN: [id number]  HPI 08/22/10- [age] yoF remote smoker, referred courtesy of Dr Haroldine Lawsrossley for allergy evaluation of chronic / recurrent rhinosinusitis. She had no such problems before 5 years ago. At that time she developed nasal congestion and an apparent sinus infection after helping move her mother out of a dusty apartment. Symptoms cleared partially in the summer. Dr Haroldine Lawsrossley did sinus surgery 2 years ago- septoplasty/ turbinate reduction. She drained well after that, but continued to get apparent infections. Serial antibiotics. Usually feels congested, worse in winter. Nasal sprays and saline rinses have been little help. Usually does not feel that she is catching a cold at onset and does not get chest congestion, cough or wheeze, ear pain or itching/ sneezing. Has never recognized seasonal allergic rhinitis.No hx of asthma or ear infections.   10/03/10 - chronic/ recurrent rhinosinusitis Allergy profile last visit- IgE 60 with significant grass and weed elevations. She sees winter predominance of symptoms, finding little effect of pollen and no benefit from singulair or allegra. She thinks infection is fading, with little discolored any more. Gets fairly regular postnasal drip that will trigger some cough.   12/19/10  64 yoF remote smoker, referred courtesy of Dr Haroldine Lawsrossley for allergy evaluation of chronic / recurrent rhinosinusitis. She called wanting treatment before hiking trip.  She reported trying a series of  remedies. She is now doing Singulair, Nasonex and Sudafed 1/2 x 240mg . Notes drainage and cough with yellow and throat clearing, rather than stuffiness. It is much less intense than it was.  Hx  of bronchopneumonia in 2004 by CT and CXR, with no f/u since.  Allergy profile had shown IgE 60 with elevations. She thinks she may be interested in skin testing in the future.   01/19/11-  64 yoF remote smoker, referred courtesy of  Dr Haroldine Lawsrossley for allergy evaluation of chronic / recurrent rhinosinusitis. Since last here [place] weather and felt more nasal congestion. Now in last few days she has been off meds for allergy testing, and weather is much cooler. She notices more runny, stuffy nose, throat clearing and sinus pressure. Chest not changed-ok.  Skin test-significant positive reactions for grass, tree, and weed pollens, dust mite, cockroach. She considers winter her worst season. We agreed to have her go back on her current maintenance medicines and see how she does this winter. We did in talk in detail about the possibility of allergy vaccine including realistic descriptions of logistics, risks and benefits.  05/22/11- 64 yoF remote smoker, referred courtesy of Dr Haroldine Lawsrossley for allergy evaluation of chronic / recurrent rhinosinusitis. She is hopeful allergy vaccine is contributing to the improvement she notices. She recognizes also the drier weather is helpful. Has not had a significant respiratory infection this winter. Still taking Allegra. Did have a sinusitis in October treated with Cleocin. Nasal discharge is still occasionally a little purulent with some blood but no pain or fever. She denies headache. Off of all nasal sprays now. Daily cough starts about 6 p.m. Occasional use of saline nasal rinse.  12/03/11- 64 yoF remote smoker, referred courtesy of Dr Haroldine Lawsrossley for allergy evaluation of chronic / recurrent rhinosinusitis Still on vaccine 1:50 GH and doing well; unable to tell any difference if using allegra or Zyrtec; no sinus infection in about 8 months!!!! Still having cough and drainage though-starts late morning and will last about every 2-3 hours-not much  trouble at night. She likes the way she is doing now and doesn't want to "rock the boat".  02/24/13- 67 yoF remote smoker, referred courtesy of Dr Haroldine Lawsrossley for allergy evaluation of chronic / recurrent rhinosinusitis FOLLOWS  FOR: still on Allergy vaccine 1:50 GH; can't tell if its helping much, spring and fall are the worst times of year for paitient. Increased drainage and nasal congestion. Rhinitis is perennial, not just in grass pollen season. Allergy profile had been significant for elevated grass pollen IgE. She saw Dr. Haroldine Lawsrossley for one sinus infection in the fall. He increase Singulair to 10 mg daily. She disliked Dymista. Nasal sprays are "harsh". We discussed Grastek.   05/2513- 67 yoF remote smoker, referred courtesy of Dr Haroldine Lawsrossley for allergy evaluation of chronic / recurrent rhinosinusitis Follows For: Treated for sinus infection in December with Zpak then Clindamycin - Still c/o prod cough (yellow- green) - Chest congestion - Sinus drainage (occas green) - Some SOB on exertion - Denies wheezing, fever, sweat, nodes. Carries kleenex to blow nose. Continue Allergy Vaccine 1:10 GO. Allergy Profile 02/24/13- Total IgE 53.3, positive esp grass and tree pollens  07/17/13- 67 yoF remote smoker, followed forf chronic / recurrent rhinosinusitis, pulmonary infiltrates, emphysema,bronchiectasis, L nodule,  H.flu.  FOLLOWS FOR: patient following up for results of CT and labs per CY. Allergy Vaccine 1:10 GH doing well Sputum cultured H.flu- we sent doxy 07/12/13 Cough and night sweats that began bothering her this winter have markedly improved on doxycycline. Now feels well. Still minor cough at night lying down- minimal Labs reviewed- CBC wnl x platelets 462K. LFT and BMET WNL, Quant TB Neg. CXR and CT reviewed- diffuse inflammatory stranding and scarring. CT chest 06/26/13   ADDENDUM REPORT: 07/03/2013 09:36  ADDENDUM:  The study was reviewed by Dr. Fredirick LatheBlietz, who suggests MAC as the most  likely diagnosis, ILD less likely.  Electronically Signed  By: Oley Balmaniel Hassell M.D.  On: 07/03/2013 09:36     CLINICAL DATA: Pulmonary fibrosis. Cough and congestion.  EXAM:  CT CHEST WITH CONTRAST  TECHNIQUE:  Multidetector CT  imaging of the chest was performed during  intravenous contrast administration.  CONTRAST: 80mL OMNIPAQUE IOHEXOL 300 MG/ML SOLN  COMPARISON: 03/09/2003  FINDINGS:  Progressive subpleural blebs in the lung apices, with opacification  of some of the right apical components demonstrating fluid levels.  Progressive pleural-based consolidation in the posterior segment  right upper lobe and extensively throughout the subpleural aspect of  the right middle lobe. There is bronchiectasis in the right lower  lobe with more scattered areas of mostly pleural-based airspace  consolidation in the posterior and lateral basal segments right  lower lobe.  On the left, progression of coarse linear opacities in basilar  segments with some patchy subpleural areas of consolidation. There  is a new nodular masslike area of consolidation measured 16 mm along  the major fissure. Small pleural-based regions of consolidation in  the upper lobe predominately at the apex.  Subcentimeter AP window and precarinal lymph nodes are more  conspicuous. No hilar adenopathy. Low-attenuation liver lesions are  stable or smaller than on previous exam, likely cysts but  incompletely characterized. Remainder visualized upper abdomen  unremarkable. Thoracic spine and sternum intact.  IMPRESSION:  1. Emphysema and bronchiectasis with interval progression of  peripheral nodular consolidation, probably primarily  fibrotic/postinflammatory although fluid levels in right apical  disease suggest infectious component.  2. Follow-up suggested after symptom resolution to confirm  appropriate clearance, exclude neoplasm.  Electronically Signed:  By: Oley Balm M.D.  On: 06/30/2013 16:20   Review of Systems- See HPI Constitutional:   No weight loss, night sweats,  Fevers, chills, fatigue, lassitude. HEENT: No headaches,  Difficulty swallowing,  Tooth/dental problems,  Sore throat,                No sneezing, itching, ear  ache, CV:  No chest pain, orthopnea, PND, swelling in lower extremities, anasarca, dizziness, palpitations GI  No heartburn, indigestion, abdominal pain, nausea, vomiting,  Resp: No shortness of breath with exertion or at rest.  No excess mucus, +minimal productive cough,            +Minimal non-productive cough,  No coughing up of blood.  No change in color of mucus.                        No- wheezing Skin: no rash or lesions. GU: MS:  No joint pain or swelling.  No decreased range of motion.  No back pain. Psych:  No change in mood or affect. No depression or anxiety.  No memory loss.  Objective:   Physical Exam General- Alert, Oriented, Affect-appropriate, Distress- none acute,  Slender/ thin chest Skin- rash-none, lesions- none, excoriation- none Lymphadenopathy- none Head- atraumatic            Eyes- Gross vision intact, PERRLA, conjunctivae clear secretions            Ears- Hearing, canals normal            Nose- clear., no-Septal dev,  polyps, erosion, perforation;   mucus bridging            Throat- Mallampati II , mucosa clear, dry;  drainage- none, tonsils- atrophic.  Neck- flexible , trachea midline, no stridor , thyroid nl, carotid no bruit Chest - symmetrical excursion , unlabored           Heart/CV- RRR , no murmur , no gallop  , no rub, nl s1 s2                           - JVD- none , edema- none, stasis changes- none, varices- none           Lung- clear, wheeze- none, cough- none, crackles- none , dullness-none, rub- none           Chest wall-  Abd- Br/ Gen/ Rectal- Not done, not indicated Extrem- cyanosis- none, clubbing, none, atrophy- none, strength- nl Neuro- grossly intact to observation

## 2013-07-17 NOTE — Assessment & Plan Note (Signed)
I would be surprised if this is all indolent H.flu bronchitis. Plan- Second week of doxycycline. Await AFB final culture. Lab for hypersensitivity pneumonitis, a1AT. Consider bronch depending on f/u status.

## 2013-07-17 NOTE — Patient Instructions (Addendum)
Script sent for second week of doxycycline  We are waiting for the final results on AFB sputum culture, which may need another 2 weeks.  Keep the April appointment.  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   Please call as needed  Order- lab- Hypersensitivity pneumonia panel, a1AT level    Dx Pneumonia

## 2013-07-21 ENCOUNTER — Ambulatory Visit (INDEPENDENT_AMBULATORY_CARE_PROVIDER_SITE_OTHER): Payer: Medicare Other

## 2013-07-21 ENCOUNTER — Encounter: Payer: Self-pay | Admitting: Internal Medicine

## 2013-07-21 DIAGNOSIS — J309 Allergic rhinitis, unspecified: Secondary | ICD-10-CM

## 2013-07-26 ENCOUNTER — Other Ambulatory Visit: Payer: Medicare Other

## 2013-07-26 ENCOUNTER — Ambulatory Visit (INDEPENDENT_AMBULATORY_CARE_PROVIDER_SITE_OTHER): Payer: Medicare Other

## 2013-07-26 DIAGNOSIS — J189 Pneumonia, unspecified organism: Secondary | ICD-10-CM

## 2013-07-26 DIAGNOSIS — J309 Allergic rhinitis, unspecified: Secondary | ICD-10-CM

## 2013-07-27 ENCOUNTER — Ambulatory Visit: Payer: Medicare Other

## 2013-07-28 LAB — ALPHA-1-ANTITRYPSIN: A1 ANTITRYPSIN SER: 191 mg/dL (ref 83–199)

## 2013-08-01 LAB — HYPERSENSITIVITY PNUEMONITIS PROFILE

## 2013-08-02 ENCOUNTER — Telehealth: Payer: Self-pay | Admitting: Internal Medicine

## 2013-08-02 ENCOUNTER — Ambulatory Visit (INDEPENDENT_AMBULATORY_CARE_PROVIDER_SITE_OTHER): Payer: Medicare Other

## 2013-08-02 DIAGNOSIS — J309 Allergic rhinitis, unspecified: Secondary | ICD-10-CM

## 2013-08-02 MED ORDER — CLARITHROMYCIN 500 MG PO TABS: 500.0000 mg | ORAL_TABLET | Freq: Two times a day (BID) | ORAL | Status: DC

## 2013-08-02 NOTE — Telephone Encounter (Signed)
Per Katie:  Pt is coughing up green & yellow mucus.  Pt's congestions is starting back.  Please triage.  Antionette FairyHolly D Pryor

## 2013-08-02 NOTE — Telephone Encounter (Signed)
lmomtcb x1 for pt 

## 2013-08-02 NOTE — Telephone Encounter (Signed)
Called and spoke with the pt  She states that she finished course of doxy x 1 wk ago  She was much improved until 3 days ago developed increased cough- prod with moderate green sputum.   She feels fatigued, but denies any fever or other co's  She is going out of town today, and wants something called in  WHEN WE CALL HER BACK LEAVE ON HER VM PER HER REQUEST "TIRED OF PLAYING PHONE TAG" Allergies  Allergen Reactions  . Sulfa Antibiotics Hives  . Other     NO MYCIN DRUGS---per patient on 07-12-13 she can take Zpak no troubles as well as Biaxin   Current Outpatient Prescriptions on File Prior to Visit  Medication Sig Dispense Refill  . calcium carbonate (OS-CAL) 600 MG TABS Take 600 mg by mouth every other day.       . cetirizine (ZYRTEC) 10 MG tablet Take 10 mg by mouth daily.      . cholecalciferol (VITAMIN D) 1000 UNITS tablet Take 1,000 Units by mouth daily.      Marland Kitchen. doxycycline (VIBRA-TABS) 100 MG tablet Take 1 tablet (100 mg total) by mouth 2 (two) times daily. With food  14 tablet  0  . Fluticasone Furoate-Vilanterol (BREO ELLIPTA) 100-25 MCG/INH AEPB Inhale 1 puff into the lungs daily.  14 each  0  . ipratropium (ATROVENT) 0.03 % nasal spray 1-2 sprays each nostril every 6 hours as needed  30 mL  12  . montelukast (SINGULAIR) 10 MG tablet Take 10 mg by mouth at bedtime.      . Multiple Vitamin (MULTIVITAMIN) tablet Take 1 tablet by mouth every other day.        No current facility-administered medications on file prior to visit.

## 2013-08-02 NOTE — Telephone Encounter (Signed)
Rx was sent to pharm  Left detailed msg for pt per her request

## 2013-08-02 NOTE — Telephone Encounter (Signed)
Per CY-lets give patient Biaxin 500 mg #20 take 1 po BID with food no refills. Thanks.

## 2013-08-03 ENCOUNTER — Ambulatory Visit: Payer: Medicare Other

## 2013-08-03 ENCOUNTER — Ambulatory Visit (INDEPENDENT_AMBULATORY_CARE_PROVIDER_SITE_OTHER): Payer: Medicare Other

## 2013-08-03 DIAGNOSIS — J309 Allergic rhinitis, unspecified: Secondary | ICD-10-CM

## 2013-08-05 LAB — AFB CULTURE WITH SMEAR (NOT AT ARMC): Acid Fast Smear: NONE SEEN

## 2013-08-08 ENCOUNTER — Encounter (INDEPENDENT_AMBULATORY_CARE_PROVIDER_SITE_OTHER): Payer: Self-pay

## 2013-08-08 ENCOUNTER — Ambulatory Visit (INDEPENDENT_AMBULATORY_CARE_PROVIDER_SITE_OTHER): Payer: Medicare Other | Admitting: Internal Medicine

## 2013-08-08 ENCOUNTER — Encounter: Payer: Self-pay | Admitting: Internal Medicine

## 2013-08-08 VITALS — BP 108/70 | HR 89 | Ht 67.0 in | Wt 109.4 lb

## 2013-08-08 DIAGNOSIS — J479 Bronchiectasis, uncomplicated: Secondary | ICD-10-CM

## 2013-08-08 NOTE — Patient Instructions (Signed)
Order Future CXR to be done in a week or two, off of Biaxin     Dx bronchiectasis  Please call as needed

## 2013-08-08 NOTE — Assessment & Plan Note (Signed)
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 Atypical infections have not cultured. History does not suggest obvious reflux. Possibility of a noninfectious inflammatory process has been considered. She has declined bronchoscopy so far. Plan-repeat chest x-ray one or 2 weeks after finishing Biaxin. ? ANCA, ? Bronch.

## 2013-08-08 NOTE — Progress Notes (Signed)
Patient ID: Rhodia AlbrightBurwell T Houdek, female    DOB: 09/12/1945, 68 y.o.   MRN: [id number]  HPI 08/22/10- [age] yoF remote smoker, referred courtesy of Dr Haroldine Lawsrossley for allergy evaluation of chronic / recurrent rhinosinusitis. She had no such problems before 5 years ago. At that time she developed nasal congestion and an apparent sinus infection after helping move her mother out of a dusty apartment. Symptoms cleared partially in the summer. Dr Haroldine Lawsrossley did sinus surgery 2 years ago- septoplasty/ turbinate reduction. She drained well after that, but continued to get apparent infections. Serial antibiotics. Usually feels congested, worse in winter. Nasal sprays and saline rinses have been little help. Usually does not feel that she is catching a cold at onset and does not get chest congestion, cough or wheeze, ear pain or itching/ sneezing. Has never recognized seasonal allergic rhinitis.No hx of asthma or ear infections.   10/03/10 - chronic/ recurrent rhinosinusitis Allergy profile last visit- IgE 60 with significant grass and weed elevations. She sees winter predominance of symptoms, finding little effect of pollen and no benefit from singulair or allegra. She thinks infection is fading, with little discolored any more. Gets fairly regular postnasal drip that will trigger some cough.   12/19/10  64 yoF remote smoker, referred courtesy of Dr Haroldine Lawsrossley for allergy evaluation of chronic / recurrent rhinosinusitis. She called wanting treatment before hiking trip.  She reported trying a series of  remedies. She is now doing Singulair, Nasonex and Sudafed 1/2 x 240mg . Notes drainage and cough with yellow and throat clearing, rather than stuffiness. It is much less intense than it was.  Hx  of bronchopneumonia in 2004 by CT and CXR, with no f/u since.  Allergy profile had shown IgE 60 with elevations. She thinks she may be interested in skin testing in the future.   01/19/11-  64 yoF remote smoker, referred courtesy of  Dr Haroldine Lawsrossley for allergy evaluation of chronic / recurrent rhinosinusitis. Since last here [place] weather and felt more nasal congestion. Now in last few days she has been off meds for allergy testing, and weather is much cooler. She notices more runny, stuffy nose, throat clearing and sinus pressure. Chest not changed-ok.  Skin test-significant positive reactions for grass, tree, and weed pollens, dust mite, cockroach. She considers winter her worst season. We agreed to have her go back on her current maintenance medicines and see how she does this winter. We did in talk in detail about the possibility of allergy vaccine including realistic descriptions of logistics, risks and benefits.  05/22/11- 64 yoF remote smoker, referred courtesy of Dr Haroldine Lawsrossley for allergy evaluation of chronic / recurrent rhinosinusitis. She is hopeful allergy vaccine is contributing to the improvement she notices. She recognizes also the drier weather is helpful. Has not had a significant respiratory infection this winter. Still taking Allegra. Did have a sinusitis in October treated with Cleocin. Nasal discharge is still occasionally a little purulent with some blood but no pain or fever. She denies headache. Off of all nasal sprays now. Daily cough starts about 6 p.m. Occasional use of saline nasal rinse.  12/03/11- 64 yoF remote smoker, referred courtesy of Dr Haroldine Lawsrossley for allergy evaluation of chronic / recurrent rhinosinusitis Still on vaccine 1:50 GH and doing well; unable to tell any difference if using allegra or Zyrtec; no sinus infection in about 8 months!!!! Still having cough and drainage though-starts late morning and will last about every 2-3 hours-not much  trouble at night. She likes the way she is doing now and doesn't want to "rock the boat".  02/24/13- 67 yoF remote smoker, referred courtesy of Dr Haroldine Lawsrossley for allergy evaluation of chronic / recurrent rhinosinusitis FOLLOWS  FOR: still on Allergy vaccine 1:50 GH; can't tell if its helping much, spring and fall are the worst times of year for paitient. Increased drainage and nasal congestion. Rhinitis is perennial, not just in grass pollen season. Allergy profile had been significant for elevated grass pollen IgE. She saw Dr. Haroldine Lawsrossley for one sinus infection in the fall. He increase Singulair to 10 mg daily. She disliked Dymista. Nasal sprays are "harsh". We discussed Grastek.   05/2513- 67 yoF remote smoker, referred courtesy of Dr Haroldine Lawsrossley for allergy evaluation of chronic / recurrent rhinosinusitis Follows For: Treated for sinus infection in December with Zpak then Clindamycin - Still c/o prod cough (yellow- green) - Chest congestion - Sinus drainage (occas green) - Some SOB on exertion - Denies wheezing, fever, sweat, nodes. Carries kleenex to blow nose. Continue Allergy Vaccine 1:10 GO. Allergy Profile 02/24/13- Total IgE 53.3, positive esp grass and tree pollens  07/17/13- 67 yoF remote smoker, followed forf chronic / recurrent rhinosinusitis, pulmonary infiltrates, emphysema,bronchiectasis, L nodule,  H.flu.  FOLLOWS FOR: patient following up for results of CT and labs per CY. Allergy Vaccine 1:10 GH doing well Sputum cultured H.flu- we sent doxy 07/12/13 Cough and night sweats that began bothering her this winter have markedly improved on doxycycline. Now feels well. Still minor cough at night lying down- minimal Labs reviewed- CBC wnl x platelets 462K. LFT and BMET WNL, Quant TB Neg. CXR and CT reviewed- diffuse inflammatory stranding and scarring. CT chest 06/26/13   ADDENDUM REPORT: 07/03/2013 09:36  ADDENDUM:  The study was reviewed by Dr. Fredirick LatheBlietz, who suggests MAC as the most  likely diagnosis, ILD less likely.  Electronically Signed  By: Oley Balmaniel Hassell M.D.  On: 07/03/2013 09:36     CLINICAL DATA: Pulmonary fibrosis. Cough and congestion.  EXAM:  CT CHEST WITH CONTRAST  TECHNIQUE:  Multidetector CT  imaging of the chest was performed during  intravenous contrast administration.  CONTRAST: 80mL OMNIPAQUE IOHEXOL 300 MG/ML SOLN  COMPARISON: 03/09/2003  FINDINGS:  Progressive subpleural blebs in the lung apices, with opacification  of some of the right apical components demonstrating fluid levels.  Progressive pleural-based consolidation in the posterior segment  right upper lobe and extensively throughout the subpleural aspect of  the right middle lobe. There is bronchiectasis in the right lower  lobe with more scattered areas of mostly pleural-based airspace  consolidation in the posterior and lateral basal segments right  lower lobe.  On the left, progression of coarse linear opacities in basilar  segments with some patchy subpleural areas of consolidation. There  is a new nodular masslike area of consolidation measured 16 mm along  the major fissure. Small pleural-based regions of consolidation in  the upper lobe predominately at the apex.  Subcentimeter AP window and precarinal lymph nodes are more  conspicuous. No hilar adenopathy. Low-attenuation liver lesions are  stable or smaller than on previous exam, likely cysts but  incompletely characterized. Remainder visualized upper abdomen  unremarkable. Thoracic spine and sternum intact.  IMPRESSION:  1. Emphysema and bronchiectasis with interval progression of  peripheral nodular consolidation, probably primarily  fibrotic/postinflammatory although fluid levels in right apical  disease suggest infectious component.  2. Follow-up suggested after symptom resolution to confirm  appropriate clearance, exclude neoplasm.  Electronically Signed:  By: Oley Balm M.D.  On: 06/30/2013 16:20   08/08/13- 67 yoF remote smoker, followed forf chronic / recurrent rhinosinusitis, pulmonary infiltrates, emphysema,bronchiectasis, L nodule,  H.flu.  FOLLOWS FOR: still having yellow-green phelgm, cough is different. Everything seems loosen and  deeper. Feels better. Still on Allergy vaccine 1:10 GO Noticing minor nasal stuffiness now with the spring pollen season but feels controlled. Breathing feels better. More stamina during exercise. Cough is looser, still with occasional scant green. Energy is better. Denies fever. Occasional postmenopausal sweat.  Denies chest pain, palpitation, blood, adenopathy, rash. Finishing last round of antibiotics (doxycycline, then Biaxin). Symptoms flared some when she ended doxycycline so she wants to be sure to finish complete antibiotic course. We reviewed labs and imaging. MAIC had been suggested by radiology but AFB cultures negative .She says she has considered, and wants to defer bronchoscopy. She doesn't like the idea of any invasive test in her body. We agreed to repeat chest x-ray after completion of Biaxin.  Review of Systems- See HPI Constitutional:   No weight loss, night sweats,  Fevers, chills, fatigue, lassitude. HEENT: No headaches,  Difficulty swallowing,  Tooth/dental problems,  Sore throat,                No sneezing, itching, ear ache, CV:  No chest pain, orthopnea, PND, swelling in lower extremities, anasarca, dizziness, palpitations GI  No heartburn, indigestion, abdominal pain, nausea, vomiting,  Resp: No shortness of breath with exertion or at rest.  No excess mucus, +minimal productive cough,            +Minimal non-productive cough,  No coughing up of blood.  No change in color of mucus.                        No- wheezing Skin: no rash or lesions. GU: MS:  No joint pain or swelling.  No decreased range of motion.  No back pain. Psych:  No change in mood or affect. No depression or anxiety.  No memory loss.  Objective:   Physical Exam General- Alert, Oriented, Affect-appropriate, Distress- none acute,  Slender/ thin chest Skin- rash-none, lesions- none, excoriation- none Lymphadenopathy- none Head- atraumatic            Eyes- Gross vision intact, PERRLA, conjunctivae clear  secretions            Ears- Hearing, canals normal            Nose- clear., no-Septal dev,  polyps, erosion, perforation;   mucus bridging            Throat- Mallampati II , mucosa clear, dry;  drainage- none, tonsils- atrophic.  Neck- flexible , trachea midline, no stridor , thyroid nl, carotid no bruit Chest - symmetrical excursion , unlabored           Heart/CV- RRR , no murmur , no gallop  , no rub, nl s1 s2                           - JVD- none , edema- none, stasis changes- none, varices- none           Lung- +bilateral mild rhonchi, unlabored, wheeze- none, cough+slight, crackles- none ,                       dullness-none, rub- none           Chest wall-  Abd- Br/ Gen/ Rectal- Not done, not indicated Extrem- cyanosis- none, clubbing, none, atrophy- none, strength- nl Neuro- grossly intact to observation

## 2013-08-09 ENCOUNTER — Ambulatory Visit (INDEPENDENT_AMBULATORY_CARE_PROVIDER_SITE_OTHER): Payer: Medicare Other

## 2013-08-09 DIAGNOSIS — J309 Allergic rhinitis, unspecified: Secondary | ICD-10-CM

## 2013-08-14 ENCOUNTER — Encounter: Payer: Self-pay | Admitting: Internal Medicine

## 2013-08-14 NOTE — Telephone Encounter (Signed)
I spoke with the pt and she states on Saturday evening she was eating and a piece of chicken became lodged in her throat. She states she had to cough forcefully to get the piece of chicken to come up. She states she then coughed up about 2 tablespoons of bright red blood. She states later in the evening she coughed up a small amount of blood again.  She states overnight she did not have any cough and slept well. She states Sunday morning she again coughed up a small amount of red blood but has not coughed up any since then. She states she has not had any coughing since then. She states she feels good, no complaints and she is at work today. I advised the pt that I will send a message to Dr. Maple HudsonYoung for any recs. Please advise. Carron CurieJennifer Delia Sitar, CMA  Allergies  Allergen Reactions  . Sulfa Antibiotics Hives  . Other     NO MYCIN DRUGS---per patient on 07-12-13 she can take Zpak no troubles as well as Biaxin

## 2013-08-14 NOTE — Telephone Encounter (Signed)
Spoke with pt. She is aware of CY's recs. Nothing further was needed.

## 2013-08-14 NOTE — Telephone Encounter (Signed)
Self-limited bleeding like this is common with lung scarring and usually is similar to a nose-bleed- just a broken blood vessel that stops on its own. We don't need to do anything unless it keeps happening. Thanks for letting us know about it.

## 2013-08-18 ENCOUNTER — Ambulatory Visit (INDEPENDENT_AMBULATORY_CARE_PROVIDER_SITE_OTHER): Payer: Medicare Other

## 2013-08-18 ENCOUNTER — Telehealth: Payer: Self-pay | Admitting: Internal Medicine

## 2013-08-18 DIAGNOSIS — J309 Allergic rhinitis, unspecified: Secondary | ICD-10-CM

## 2013-08-18 MED ORDER — AMOXICILLIN-POT CLAVULANATE 875-125 MG PO TABS: 1.0000 | ORAL_TABLET | Freq: Two times a day (BID) | ORAL | Status: DC

## 2013-08-18 NOTE — Telephone Encounter (Signed)
Pt returning call.Sara Beasley ° °

## 2013-08-18 NOTE — Telephone Encounter (Signed)
lmomtcb x1 

## 2013-08-18 NOTE — Telephone Encounter (Signed)
Called and lmom to make the pt aware that the augmentin has been sent to her pharmacy.  Pt requested that a message be left on her machine if she was not able to answer the phone.  Nothing further is needed.

## 2013-08-18 NOTE — Telephone Encounter (Signed)
Called and spoke with pt and he stated that he has been having a cough--with yellow/green sputum.  He stated that while on the doxy this cleared up but after being off the abx for 3 days this congestion comes back.  Pt has no other symptoms at this time.  Is wanting to know how to proceed.  CY please advise. Thanks  Last ov--08/08/13 Next ov--09/14/13  Allergies  Allergen Reactions  . Sulfa Antibiotics Hives  . Other     NO MYCIN DRUGS---per patient on 07-12-13 she can take Zpak no troubles as well as Biaxin    Current Outpatient Prescriptions on File Prior to Visit  Medication Sig Dispense Refill  . Biotin 1000 MCG tablet Take 1,000 mcg by mouth daily.      . calcium carbonate (OS-CAL) 600 MG TABS Take 600 mg by mouth every other day.       . cetirizine (ZYRTEC) 10 MG tablet Take 10 mg by mouth daily.      . cholecalciferol (VITAMIN D) 1000 UNITS tablet Take 1,000 Units by mouth daily.      . clarithromycin (BIAXIN) 500 MG tablet Take 1 tablet (500 mg total) by mouth 2 (two) times daily with a meal.  20 tablet  0  . montelukast (SINGULAIR) 10 MG tablet Take 10 mg by mouth at bedtime.      . Multiple Vitamin (MULTIVITAMIN) tablet Take 1 tablet by mouth every other day.        No current facility-administered medications on file prior to visit.

## 2013-08-18 NOTE — Telephone Encounter (Signed)
Suggest augmentin 500 mg, # 30, 1 twice daily

## 2013-08-18 NOTE — Telephone Encounter (Signed)
lmomtcb x1 on home and cell #

## 2013-08-24 ENCOUNTER — Ambulatory Visit: Payer: Medicare Other | Admitting: Internal Medicine

## 2013-08-25 ENCOUNTER — Ambulatory Visit (INDEPENDENT_AMBULATORY_CARE_PROVIDER_SITE_OTHER): Payer: Medicare Other

## 2013-08-25 DIAGNOSIS — J309 Allergic rhinitis, unspecified: Secondary | ICD-10-CM

## 2013-09-01 ENCOUNTER — Ambulatory Visit (INDEPENDENT_AMBULATORY_CARE_PROVIDER_SITE_OTHER): Payer: Medicare Other

## 2013-09-01 DIAGNOSIS — J309 Allergic rhinitis, unspecified: Secondary | ICD-10-CM

## 2013-09-08 ENCOUNTER — Ambulatory Visit (INDEPENDENT_AMBULATORY_CARE_PROVIDER_SITE_OTHER): Payer: Medicare Other

## 2013-09-08 DIAGNOSIS — J309 Allergic rhinitis, unspecified: Secondary | ICD-10-CM

## 2013-09-14 ENCOUNTER — Encounter: Payer: Self-pay | Admitting: Internal Medicine

## 2013-09-14 ENCOUNTER — Ambulatory Visit (INDEPENDENT_AMBULATORY_CARE_PROVIDER_SITE_OTHER): Payer: Medicare Other

## 2013-09-14 ENCOUNTER — Ambulatory Visit (INDEPENDENT_AMBULATORY_CARE_PROVIDER_SITE_OTHER): Payer: Medicare Other | Admitting: Internal Medicine

## 2013-09-14 VITALS — BP 142/98 | HR 79 | Ht 67.0 in | Wt 107.8 lb

## 2013-09-14 DIAGNOSIS — Z23 Encounter for immunization: Secondary | ICD-10-CM

## 2013-09-14 DIAGNOSIS — J479 Bronchiectasis, uncomplicated: Secondary | ICD-10-CM

## 2013-09-14 DIAGNOSIS — J310 Chronic rhinitis: Secondary | ICD-10-CM

## 2013-09-14 DIAGNOSIS — J309 Allergic rhinitis, unspecified: Secondary | ICD-10-CM

## 2013-09-14 NOTE — Patient Instructions (Addendum)
We have suggested return in 4 months for a follow-up visit, probably getting a chest xray at that point to see how your lungs look.   Pneumovax 23

## 2013-09-14 NOTE — Progress Notes (Signed)
Patient ID: Sara Beasley, female    DOB: 10/11/1945, 68 y.o.   MRN: [id number]  HPI 08/22/10- [age] yoF remote smoker, referred courtesy of Dr Haroldine Lawsrossley for allergy evaluation of chronic / recurrent rhinosinusitis. She had no such problems before 5 years ago. At that time she developed nasal congestion and an apparent sinus infection after helping move her mother out of a dusty apartment. Symptoms cleared partially in the summer. Dr Haroldine Lawsrossley did sinus surgery 2 years ago- septoplasty/ turbinate reduction. She drained well after that, but continued to get apparent infections. Serial antibiotics. Usually feels congested, worse in winter. Nasal sprays and saline rinses have been little help. Usually does not feel that she is catching a cold at onset and does not get chest congestion, cough or wheeze, ear pain or itching/ sneezing. Has never recognized seasonal allergic rhinitis.No hx of asthma or ear infections.   10/03/10 - chronic/ recurrent rhinosinusitis Allergy profile last visit- IgE 60 with significant grass and weed elevations. She sees winter predominance of symptoms, finding little effect of pollen and no benefit from singulair or allegra. She thinks infection is fading, with little discolored any more. Gets fairly regular postnasal drip that will trigger some cough.   12/19/10  64 yoF remote smoker, referred courtesy of Dr Haroldine Lawsrossley for allergy evaluation of chronic / recurrent rhinosinusitis. She called wanting treatment before hiking trip.  She reported trying a series of  remedies. She is now doing Singulair, Nasonex and Sudafed 1/2 x 240mg . Notes drainage and cough with yellow and throat clearing, rather than stuffiness. It is much less intense than it was.  Hx  of bronchopneumonia in 2004 by CT and CXR, with no f/u since.  Allergy profile had shown IgE 60 with elevations. She thinks she may be interested in skin testing in the future.   01/19/11-  64 yoF remote smoker, referred courtesy of  Dr Haroldine Lawsrossley for allergy evaluation of chronic / recurrent rhinosinusitis. Since last here had [place]ed in [address] [place] with wetter weather and felt more nasal congestion. Now in last few days she has been off meds for allergy testing, and weather is much cooler. She notices more runny, stuffy nose, throat clearing and sinus pressure. Chest not changed-ok.  Skin test-significant positive reactions for grass, tree, and weed pollens, dust mite, cockroach. She considers winter her worst season. We agreed to have her go back on her current maintenance medicines and see how she does this winter. We did in talk in detail about the possibility of allergy vaccine including realistic descriptions of logistics, risks and benefits.  05/22/11- 64 yoF remote smoker, referred courtesy of Dr Haroldine Lawsrossley for allergy evaluation of chronic / recurrent rhinosinusitis. She is hopeful allergy vaccine is contributing to the improvement she notices. She recognizes also the drier weather is helpful. Has not had a significant respiratory infection this winter. Still taking Allegra. Did have a sinusitis in October treated with Cleocin. Nasal discharge is still occasionally a little purulent with some blood but no pain or fever. She denies headache. Off of all nasal sprays now. Daily cough starts about 6 p.m. Occasional use of saline nasal rinse.  12/03/11- 64 yoF remote smoker, referred courtesy of Dr Haroldine Lawsrossley for allergy evaluation of chronic / recurrent rhinosinusitis Still on vaccine 1:50 GH and doing well; unable to tell any difference if using allegra or Zyrtec; no sinus infection in about 8 months!!!! Still having cough and drainage though-starts late morning and will last about every 2-3 hours-not much  trouble at night. She likes the way she is doing now and doesn't want to "rock the boat".  02/24/13- 67 yoF remote smoker, referred courtesy of Dr Haroldine Lawsrossley for allergy evaluation of chronic / recurrent rhinosinusitis FOLLOWS  FOR: still on Allergy vaccine 1:50 GH; can't tell if its helping much, spring and fall are the worst times of year for paitient. Increased drainage and nasal congestion. Rhinitis is perennial, not just in grass pollen season. Allergy profile had been significant for elevated grass pollen IgE. She saw Dr. Haroldine Lawsrossley for one sinus infection in the fall. He increase Singulair to 10 mg daily. She disliked Dymista. Nasal sprays are "harsh". We discussed Grastek.   05/2513- 67 yoF remote smoker, referred courtesy of Dr Haroldine Lawsrossley for allergy evaluation of chronic / recurrent rhinosinusitis Follows For: Treated for sinus infection in December with Zpak then Clindamycin - Still c/o prod cough (yellow- green) - Chest congestion - Sinus drainage (occas green) - Some SOB on exertion - Denies wheezing, fever, sweat, nodes. Carries kleenex to blow nose. Continue Allergy Vaccine 1:10 GO. Allergy Profile 02/24/13- Total IgE 53.3, positive esp grass and tree pollens  07/17/13- 67 yoF remote smoker, followed forf chronic / recurrent rhinosinusitis, pulmonary infiltrates, emphysema,bronchiectasis, L nodule,  H.flu.  FOLLOWS FOR: patient following up for results of CT and labs per CY. Allergy Vaccine 1:10 GH doing well Sputum cultured H.flu- we sent doxy 07/12/13 Cough and night sweats that began bothering her this winter have markedly improved on doxycycline. Now feels well. Still minor cough at night lying down- minimal Labs reviewed- CBC wnl x platelets 462K. LFT and BMET WNL, Quant TB Neg. CXR and CT reviewed- diffuse inflammatory stranding and scarring. CT chest 06/26/13   ADDENDUM REPORT: 07/03/2013 09:36  ADDENDUM:  The study was reviewed by Dr. Fredirick LatheBlietz, who suggests MAC as the most  likely diagnosis, ILD less likely.  Electronically Signed  By: Oley Balmaniel Hassell M.D.  On: 07/03/2013 09:36     CLINICAL DATA: Pulmonary fibrosis. Cough and congestion.  EXAM:  CT CHEST WITH CONTRAST  TECHNIQUE:  Multidetector CT  imaging of the chest was performed during  intravenous contrast administration.  CONTRAST: 80mL OMNIPAQUE IOHEXOL 300 MG/ML SOLN  COMPARISON: 03/09/2003  FINDINGS:  Progressive subpleural blebs in the lung apices, with opacification  of some of the right apical components demonstrating fluid levels.  Progressive pleural-based consolidation in the posterior segment  right upper lobe and extensively throughout the subpleural aspect of  the right middle lobe. There is bronchiectasis in the right lower  lobe with more scattered areas of mostly pleural-based airspace  consolidation in the posterior and lateral basal segments right  lower lobe.  On the left, progression of coarse linear opacities in basilar  segments with some patchy subpleural areas of consolidation. There  is a new nodular masslike area of consolidation measured 16 mm along  the major fissure. Small pleural-based regions of consolidation in  the upper lobe predominately at the apex.  Subcentimeter AP window and precarinal lymph nodes are more  conspicuous. No hilar adenopathy. Low-attenuation liver lesions are  stable or smaller than on previous exam, likely cysts but  incompletely characterized. Remainder visualized upper abdomen  unremarkable. Thoracic spine and sternum intact.  IMPRESSION:  1. Emphysema and bronchiectasis with interval progression of  peripheral nodular consolidation, probably primarily  fibrotic/postinflammatory although fluid levels in right apical  disease suggest infectious component.  2. Follow-up suggested after symptom resolution to confirm  appropriate clearance, exclude neoplasm.  Electronically Signed:  By: Oley Balmaniel Hassell M.D.  On: 06/30/2013 16:20   08/08/13- 67 yoF remote smoker, followed forf chronic / recurrent rhinosinusitis, pulmonary infiltrates, emphysema,bronchiectasis, L nodule,  H.flu.  FOLLOWS FOR: still having yellow-green phelgm, cough is different. Everything seems loosen and  deeper. Feels better. Still on Allergy vaccine 1:10 GO Noticing minor nasal stuffiness now with the spring pollen season but feels controlled. Breathing feels better. More stamina during exercise. Cough is looser, still with occasional scant green. Energy is better. Denies fever. Occasional postmenopausal sweat.  Denies chest pain, palpitation, blood, adenopathy, rash. Finishing last round of antibiotics (doxycycline, then Biaxin). Symptoms flared some when she ended doxycycline so she wants to be sure to finish complete antibiotic course. We reviewed labs and imaging. MAIC had been suggested by radiology but AFB cultures negative .She says she has considered, and wants to defer bronchoscopy. She doesn't like the idea of any invasive test in her body. We agreed to repeat chest x-ray after completion of Biaxin.  09/14/13-67 yoF remote smoker, followed for chronic / recurrent rhinosinusitis, pulmonary infiltrates, emphysema,bronchiectasis, L nodule, 1 episode heme, cx'd  H.flu.  She had reported hemoptysis 08/14/13 and we sent Biaxin then augmentin, planning f/u CXR. Pt states breathing has improved since last visit. Pt states cough and SOB have improved. Denies CP.  She now says episode of hemoptysis happened after she choked on some chicken and coughed very hard. Denies any further bleeding. She wants to defer chest x-ray today. Reports 14,000 steps on her FitBit yesterday  Review of Systems- See HPI Constitutional:   No weight loss, night sweats,  Fevers, chills, fatigue, lassitude. HEENT: No headaches,  Difficulty swallowing,  Tooth/dental problems,  Sore throat,                No sneezing, itching, ear ache, CV:  No chest pain, orthopnea, PND, swelling in lower extremities, anasarca, dizziness, palpitations GI  No heartburn, indigestion, abdominal pain, nausea, vomiting,  Resp: No shortness of breath with exertion or at rest.  No excess mucus, +minimal productive cough,            +Minimal  non-productive cough,  No coughing up of blood.  No change in color of mucus.                        No- wheezing Skin: no rash or lesions. GU: MS:  No joint pain or swelling.  No decreased range of motion.  No back pain. Psych:  No change in mood or affect. No depression or anxiety.  No memory loss.  Objective:   Physical Exam General- Alert, Oriented, Affect-appropriate, Distress- none acute,  Slender/ thin chest Skin- rash-none, lesions- none, excoriation- none Lymphadenopathy- none Head- atraumatic            Eyes- Gross vision intact, PERRLA, conjunctivae clear secretions            Ears- Hearing, canals normal            Nose- clear., no-Septal dev,  polyps, erosion, perforation;   mucus bridging            Throat- Mallampati II , mucosa clear, dry;  drainage- none, tonsils- atrophic.  Neck- flexible , trachea midline, no stridor , thyroid nl, carotid no bruit Chest - symmetrical excursion , unlabored           Heart/CV- RRR , no murmur , no gallop  , no rub, nl s1 s2                           -  JVD- none , edema- none, stasis changes- none, varices- none           Lung- +bilateral mild rhonchi upper zones, unlabored, wheeze- none, cough-none,                                       dullness-none, rub- none           Chest wall-  Abd- Br/ Gen/ Rectal- Not done, not indicated Extrem- cyanosis- none, clubbing, none, atrophy- none, strength- nl Neuro- grossly intact to observation

## 2013-09-15 ENCOUNTER — Ambulatory Visit: Payer: Medicare Other

## 2013-09-22 ENCOUNTER — Ambulatory Visit (INDEPENDENT_AMBULATORY_CARE_PROVIDER_SITE_OTHER): Payer: Medicare Other

## 2013-09-22 DIAGNOSIS — J309 Allergic rhinitis, unspecified: Secondary | ICD-10-CM

## 2013-09-29 ENCOUNTER — Ambulatory Visit (INDEPENDENT_AMBULATORY_CARE_PROVIDER_SITE_OTHER): Payer: Medicare Other

## 2013-09-29 DIAGNOSIS — J309 Allergic rhinitis, unspecified: Secondary | ICD-10-CM

## 2013-10-06 ENCOUNTER — Ambulatory Visit (INDEPENDENT_AMBULATORY_CARE_PROVIDER_SITE_OTHER): Payer: Medicare Other

## 2013-10-06 DIAGNOSIS — J309 Allergic rhinitis, unspecified: Secondary | ICD-10-CM

## 2013-10-13 ENCOUNTER — Ambulatory Visit (INDEPENDENT_AMBULATORY_CARE_PROVIDER_SITE_OTHER): Payer: Medicare Other

## 2013-10-13 DIAGNOSIS — J309 Allergic rhinitis, unspecified: Secondary | ICD-10-CM

## 2013-10-20 ENCOUNTER — Ambulatory Visit: Payer: Medicare Other

## 2013-10-23 ENCOUNTER — Ambulatory Visit (INDEPENDENT_AMBULATORY_CARE_PROVIDER_SITE_OTHER): Payer: Medicare Other

## 2013-10-23 DIAGNOSIS — J309 Allergic rhinitis, unspecified: Secondary | ICD-10-CM

## 2013-10-30 ENCOUNTER — Ambulatory Visit: Payer: Medicare Other

## 2013-10-31 ENCOUNTER — Ambulatory Visit (INDEPENDENT_AMBULATORY_CARE_PROVIDER_SITE_OTHER): Payer: Medicare Other

## 2013-10-31 DIAGNOSIS — J309 Allergic rhinitis, unspecified: Secondary | ICD-10-CM

## 2013-11-05 NOTE — Assessment & Plan Note (Signed)
Scarring and fibrosis appeared to be inflammatory. She is feeling much better now after Biaxin followed by Augmentin and wants to defer chest x-ray. We agreed to repeat chest x-ray on return in 4 months

## 2013-11-05 NOTE — Assessment & Plan Note (Signed)
Continues allergy vaccine 1:10 GH without problems

## 2013-11-06 ENCOUNTER — Ambulatory Visit: Payer: Medicare Other

## 2013-11-07 ENCOUNTER — Ambulatory Visit (INDEPENDENT_AMBULATORY_CARE_PROVIDER_SITE_OTHER): Payer: Medicare Other

## 2013-11-07 DIAGNOSIS — J309 Allergic rhinitis, unspecified: Secondary | ICD-10-CM

## 2013-11-08 ENCOUNTER — Encounter: Payer: Self-pay | Admitting: Internal Medicine

## 2013-11-10 ENCOUNTER — Ambulatory Visit (INDEPENDENT_AMBULATORY_CARE_PROVIDER_SITE_OTHER): Payer: Medicare Other

## 2013-11-10 DIAGNOSIS — J309 Allergic rhinitis, unspecified: Secondary | ICD-10-CM

## 2013-11-13 ENCOUNTER — Ambulatory Visit (INDEPENDENT_AMBULATORY_CARE_PROVIDER_SITE_OTHER): Payer: Medicare Other

## 2013-11-13 DIAGNOSIS — J309 Allergic rhinitis, unspecified: Secondary | ICD-10-CM

## 2013-11-14 ENCOUNTER — Ambulatory Visit: Payer: Medicare Other

## 2013-11-20 ENCOUNTER — Ambulatory Visit (INDEPENDENT_AMBULATORY_CARE_PROVIDER_SITE_OTHER): Payer: Medicare Other

## 2013-11-20 DIAGNOSIS — J309 Allergic rhinitis, unspecified: Secondary | ICD-10-CM

## 2013-11-24 ENCOUNTER — Encounter: Payer: Self-pay | Admitting: Internal Medicine

## 2013-11-28 ENCOUNTER — Ambulatory Visit: Payer: Medicare Other

## 2013-12-01 ENCOUNTER — Ambulatory Visit (INDEPENDENT_AMBULATORY_CARE_PROVIDER_SITE_OTHER): Payer: Medicare Other

## 2013-12-01 DIAGNOSIS — J309 Allergic rhinitis, unspecified: Secondary | ICD-10-CM

## 2013-12-08 ENCOUNTER — Ambulatory Visit (INDEPENDENT_AMBULATORY_CARE_PROVIDER_SITE_OTHER): Payer: Medicare Other

## 2013-12-08 DIAGNOSIS — J309 Allergic rhinitis, unspecified: Secondary | ICD-10-CM

## 2013-12-15 ENCOUNTER — Ambulatory Visit (INDEPENDENT_AMBULATORY_CARE_PROVIDER_SITE_OTHER): Payer: Medicare Other

## 2013-12-15 DIAGNOSIS — J309 Allergic rhinitis, unspecified: Secondary | ICD-10-CM

## 2013-12-22 ENCOUNTER — Ambulatory Visit: Payer: Medicare Other

## 2013-12-26 ENCOUNTER — Ambulatory Visit (INDEPENDENT_AMBULATORY_CARE_PROVIDER_SITE_OTHER): Payer: Medicare Other

## 2013-12-26 DIAGNOSIS — J309 Allergic rhinitis, unspecified: Secondary | ICD-10-CM

## 2014-01-02 ENCOUNTER — Ambulatory Visit (INDEPENDENT_AMBULATORY_CARE_PROVIDER_SITE_OTHER): Payer: Medicare Other

## 2014-01-02 DIAGNOSIS — J309 Allergic rhinitis, unspecified: Secondary | ICD-10-CM

## 2014-01-08 ENCOUNTER — Ambulatory Visit (INDEPENDENT_AMBULATORY_CARE_PROVIDER_SITE_OTHER): Payer: Medicare Other

## 2014-01-08 DIAGNOSIS — J309 Allergic rhinitis, unspecified: Secondary | ICD-10-CM

## 2014-01-15 ENCOUNTER — Ambulatory Visit (INDEPENDENT_AMBULATORY_CARE_PROVIDER_SITE_OTHER): Payer: Medicare Other

## 2014-01-15 DIAGNOSIS — J309 Allergic rhinitis, unspecified: Secondary | ICD-10-CM

## 2014-01-22 ENCOUNTER — Ambulatory Visit (INDEPENDENT_AMBULATORY_CARE_PROVIDER_SITE_OTHER)
Admission: RE | Admit: 2014-01-22 | Discharge: 2014-01-22 | Disposition: A | Discharge status: Home / Self Care | Payer: Medicare Other | Source: Ambulatory Visit | Attending: Internal Medicine | Admitting: Internal Medicine

## 2014-01-22 ENCOUNTER — Ambulatory Visit (INDEPENDENT_AMBULATORY_CARE_PROVIDER_SITE_OTHER): Payer: Medicare Other

## 2014-01-22 DIAGNOSIS — J479 Bronchiectasis, uncomplicated: Secondary | ICD-10-CM

## 2014-01-22 DIAGNOSIS — J309 Allergic rhinitis, unspecified: Secondary | ICD-10-CM

## 2014-01-23 ENCOUNTER — Ambulatory Visit: Payer: Medicare Other | Admitting: Internal Medicine

## 2014-01-23 ENCOUNTER — Encounter: Payer: Self-pay | Admitting: Internal Medicine

## 2014-01-23 VITALS — BP 118/70 | HR 79 | Ht 67.0 in | Wt 109.0 lb

## 2014-01-23 DIAGNOSIS — J189 Pneumonia, unspecified organism: Secondary | ICD-10-CM

## 2014-01-23 MED ORDER — ALBUTEROL SULFATE HFA 108 (90 BASE) MCG/ACT IN AERS: INHALATION_SPRAY | RESPIRATORY_TRACT | Status: DC

## 2014-01-23 NOTE — Patient Instructions (Addendum)
Script and training albuterol HFA rescue inhaler- 2 puffs, may repeat every 6 hours, as needed for wheezing/ shortness of breath  Plan on flu shot this fall, and we can give your Prevnar pneumonia vaccination in the Spring.  Please call as needed

## 2014-01-23 NOTE — Progress Notes (Signed)
Patient ID: Sara Beasley, female    DOB: 09/12/1945, 68 y.o.   MRN: [id number]  HPI 08/22/10- [age] yoF remote smoker, referred courtesy of Dr Haroldine Lawsrossley for allergy evaluation of chronic / recurrent rhinosinusitis. She had no such problems before 5 years ago. At that time she developed nasal congestion and an apparent sinus infection after helping move her mother out of a dusty apartment. Symptoms cleared partially in the summer. Dr Haroldine Lawsrossley did sinus surgery 2 years ago- septoplasty/ turbinate reduction. She drained well after that, but continued to get apparent infections. Serial antibiotics. Usually feels congested, worse in winter. Nasal sprays and saline rinses have been little help. Usually does not feel that she is catching a cold at onset and does not get chest congestion, cough or wheeze, ear pain or itching/ sneezing. Has never recognized seasonal allergic rhinitis.No hx of asthma or ear infections.   10/03/10 - chronic/ recurrent rhinosinusitis Allergy profile last visit- IgE 60 with significant grass and weed elevations. She sees winter predominance of symptoms, finding little effect of pollen and no benefit from singulair or allegra. She thinks infection is fading, with little discolored any more. Gets fairly regular postnasal drip that will trigger some cough.   12/19/10  64 yoF remote smoker, referred courtesy of Dr Haroldine Lawsrossley for allergy evaluation of chronic / recurrent rhinosinusitis. She called wanting treatment before hiking trip.  She reported trying a series of  remedies. She is now doing Singulair, Nasonex and Sudafed 1/2 x 240mg . Notes drainage and cough with yellow and throat clearing, rather than stuffiness. It is much less intense than it was.  Hx  of bronchopneumonia in 2004 by CT and CXR, with no f/u since.  Allergy profile had shown IgE 60 with elevations. She thinks she may be interested in skin testing in the future.   01/19/11-  64 yoF remote smoker, referred courtesy of  Dr Haroldine Lawsrossley for allergy evaluation of chronic / recurrent rhinosinusitis. Since last here had [place] in [place] with wetter weather and felt more nasal congestion. Now in last few days she has been off meds for allergy testing, and weather is much cooler. She notices more runny, stuffy nose, throat clearing and sinus pressure. Chest not changed-ok.  Skin test-significant positive reactions for grass, tree, and weed pollens, dust mite, cockroach. She considers winter her worst season. We agreed to have her go back on her current maintenance medicines and see how she does this winter. We did in talk in detail about the possibility of allergy vaccine including realistic descriptions of logistics, risks and benefits.  05/22/11- 64 yoF remote smoker, referred courtesy of Dr Haroldine Lawsrossley for allergy evaluation of chronic / recurrent rhinosinusitis. She is hopeful allergy vaccine is contributing to the improvement she notices. She recognizes also the drier weather is helpful. Has not had a significant respiratory infection this winter. Still taking Allegra. Did have a sinusitis in October treated with Cleocin. Nasal discharge is still occasionally a little purulent with some blood but no pain or fever. She denies headache. Off of all nasal sprays now. Daily cough starts about 6 p.m. Occasional use of saline nasal rinse.  12/03/11- 64 yoF remote smoker, referred courtesy of Dr Haroldine Lawsrossley for allergy evaluation of chronic / recurrent rhinosinusitis Still on vaccine 1:50 GH and doing well; unable to tell any difference if using allegra or Zyrtec; no sinus infection in about 8 months!!!! Still having cough and drainage though-starts late morning and will last about every 2-3 hours-not much  trouble at night. She likes the way she is doing now and doesn't want to "rock the boat".  02/24/13- 67 yoF remote smoker, referred courtesy of Dr Haroldine Lawsrossley for allergy evaluation of chronic / recurrent rhinosinusitis FOLLOWS  FOR: still on Allergy vaccine 1:50 GH; can't tell if its helping much, spring and fall are the worst times of year for paitient. Increased drainage and nasal congestion. Rhinitis is perennial, not just in grass pollen season. Allergy profile had been significant for elevated grass pollen IgE. She saw Dr. Haroldine Lawsrossley for one sinus infection in the fall. He increase Singulair to 10 mg daily. She disliked Dymista. Nasal sprays are "harsh". We discussed Grastek.   05/2513- 67 yoF remote smoker, referred courtesy of Dr Haroldine Lawsrossley for allergy evaluation of chronic / recurrent rhinosinusitis Follows For: Treated for sinus infection in December with Zpak then Clindamycin - Still c/o prod cough (yellow- green) - Chest congestion - Sinus drainage (occas green) - Some SOB on exertion - Denies wheezing, fever, sweat, nodes. Carries kleenex to blow nose. Continue Allergy Vaccine 1:10 GO. Allergy Profile 02/24/13- Total IgE 53.3, positive esp grass and tree pollens  07/17/13- 67 yoF remote smoker, followed forf chronic / recurrent rhinosinusitis, pulmonary infiltrates, emphysema,bronchiectasis, L nodule,  H.flu.  FOLLOWS FOR: patient following up for results of CT and labs per CY. Allergy Vaccine 1:10 GH doing well Sputum cultured H.flu- we sent doxy 07/12/13 Cough and night sweats that began bothering her this winter have markedly improved on doxycycline. Now feels well. Still minor cough at night lying down- minimal Labs reviewed- CBC wnl x platelets 462K. LFT and BMET WNL, Quant TB Neg. CXR and CT reviewed- diffuse inflammatory stranding and scarring. CT chest 06/26/13   ADDENDUM REPORT: 07/03/2013 09:36  ADDENDUM:  The study was reviewed by Dr. Fredirick LatheBlietz, who suggests MAC as the most  likely diagnosis, ILD less likely.  Electronically Signed  By: Oley Balmaniel Hassell M.D.  On: 07/03/2013 09:36     CLINICAL DATA: Pulmonary fibrosis. Cough and congestion.  EXAM:  CT CHEST WITH CONTRAST  TECHNIQUE:  Multidetector CT  imaging of the chest was performed during  intravenous contrast administration.  CONTRAST: 80mL OMNIPAQUE IOHEXOL 300 MG/ML SOLN  COMPARISON: 03/09/2003  FINDINGS:  Progressive subpleural blebs in the lung apices, with opacification  of some of the right apical components demonstrating fluid levels.  Progressive pleural-based consolidation in the posterior segment  right upper lobe and extensively throughout the subpleural aspect of  the right middle lobe. There is bronchiectasis in the right lower  lobe with more scattered areas of mostly pleural-based airspace  consolidation in the posterior and lateral basal segments right  lower lobe.  On the left, progression of coarse linear opacities in basilar  segments with some patchy subpleural areas of consolidation. There  is a new nodular masslike area of consolidation measured 16 mm along  the major fissure. Small pleural-based regions of consolidation in  the upper lobe predominately at the apex.  Subcentimeter AP window and precarinal lymph nodes are more  conspicuous. No hilar adenopathy. Low-attenuation liver lesions are  stable or smaller than on previous exam, likely cysts but  incompletely characterized. Remainder visualized upper abdomen  unremarkable. Thoracic spine and sternum intact.  IMPRESSION:  1. Emphysema and bronchiectasis with interval progression of  peripheral nodular consolidation, probably primarily  fibrotic/postinflammatory although fluid levels in right apical  disease suggest infectious component.  2. Follow-up suggested after symptom resolution to confirm  appropriate clearance, exclude neoplasm.  Electronically Signed:  By: Oley Balm M.D.  On: 06/30/2013 16:20   08/08/13- 67 yoF remote smoker, followed forf chronic / recurrent rhinosinusitis, pulmonary infiltrates, emphysema,bronchiectasis, L nodule,  H.flu.  FOLLOWS FOR: still having yellow-green phelgm, cough is different. Everything seems loosen and  deeper. Feels better. Still on Allergy vaccine 1:10 GO Noticing minor nasal stuffiness now with the spring pollen season but feels controlled. Breathing feels better. More stamina during exercise. Cough is looser, still with occasional scant green. Energy is better. Denies fever. Occasional postmenopausal sweat.  Denies chest pain, palpitation, blood, adenopathy, rash. Finishing last round of antibiotics (doxycycline, then Biaxin). Symptoms flared some when she ended doxycycline so she wants to be sure to finish complete antibiotic course. We reviewed labs and imaging. MAIC had been suggested by radiology but AFB cultures negative .She says she has considered, and wants to defer bronchoscopy. She doesn't like the idea of any invasive test in her body. We agreed to repeat chest x-ray after completion of Biaxin.  09/14/13-67 yoF remote smoker, followed for chronic / recurrent rhinosinusitis, pulmonary infiltrates, emphysema,bronchiectasis, L nodule, 1 episode heme, cx'd  H.flu.  She had reported hemoptysis 08/14/13 and we sent Biaxin then augmentin, planning f/u CXR. Pt states breathing has improved since last visit. Pt states cough and SOB have improved. Denies CP.  She now says episode of hemoptysis happened after she choked on some chicken and coughed very hard. Denies any further bleeding. She wants to defer chest x-ray today. Reports 14,000 steps on her FitBit yesterday  01/23/14- 68 yoF remote smoker, followed for chronic / recurrent rhinosinusitis, pulmonary infiltrates, emphysema,bronchiectasis, L nodule, 1 episode heme, cx'd  H.flu.  CXR 01/22/14 IMPRESSION:  Slight improvement of nodular opacity in the right mid lung with  otherwise unchanged appearance of the lungs as above.  Electronically Signed  By: Sebastian Ache  On: 01/22/2014 14:19   Review of Systems- See HPI Constitutional:   No weight loss, night sweats,  Fevers, chills, fatigue, lassitude. HEENT: No headaches,  Difficulty  swallowing,  Tooth/dental problems,  Sore throat,                No sneezing, itching, ear ache, CV:  No chest pain, orthopnea, PND, swelling in lower extremities, anasarca, dizziness, palpitations GI  No heartburn, indigestion, abdominal pain, nausea, vomiting,  Resp: No shortness of breath with exertion or at rest.  No excess mucus, +minimal productive cough,            +Minimal non-productive cough,  No coughing up of blood.  No change in color of mucus.                        No- wheezing Skin: no rash or lesions. GU: MS:  No joint pain or swelling.  No decreased range of motion.  No back pain. Psych:  No change in mood or affect. No depression or anxiety.  No memory loss.  Objective:   Physical Exam General- Alert, Oriented, Affect-appropriate, Distress- none acute,  Slender/ thin chest Skin- rash-none, lesions- none, excoriation- none Lymphadenopathy- none Head- atraumatic            Eyes- Gross vision intact, PERRLA, conjunctivae clear secretions            Ears- Hearing, canals normal            Nose- clear., no-Septal dev,  polyps, erosion, perforation;   mucus bridging            Throat- Mallampati  II , mucosa clear, dry;  drainage- none, tonsils- atrophic.  Neck- flexible , trachea midline, no stridor , thyroid nl, carotid no bruit Chest - symmetrical excursion , unlabored           Heart/CV- RRR , no murmur , no gallop  , no rub, nl s1 s2                           - JVD- none , edema- none, stasis changes- none, varices- none           Lung- +bilateral mild rhonchi upper zones, unlabored, wheeze- none, cough-none,                                       dullness-none, rub- none           Chest wall-  Abd- Br/ Gen/ Rectal- Not done, not indicated Extrem- cyanosis- none, clubbing, none, atrophy- none, strength- nl Neuro- grossly intact to observation

## 2014-01-29 ENCOUNTER — Ambulatory Visit: Payer: Medicare Other

## 2014-01-30 ENCOUNTER — Ambulatory Visit: Payer: Medicare Other

## 2014-01-31 ENCOUNTER — Encounter: Payer: Self-pay | Admitting: Internal Medicine

## 2014-02-06 ENCOUNTER — Ambulatory Visit (INDEPENDENT_AMBULATORY_CARE_PROVIDER_SITE_OTHER): Payer: Medicare Other

## 2014-02-06 DIAGNOSIS — Z23 Encounter for immunization: Secondary | ICD-10-CM

## 2014-02-06 DIAGNOSIS — J309 Allergic rhinitis, unspecified: Secondary | ICD-10-CM

## 2014-02-13 ENCOUNTER — Ambulatory Visit (INDEPENDENT_AMBULATORY_CARE_PROVIDER_SITE_OTHER): Payer: Medicare Other

## 2014-02-13 DIAGNOSIS — J309 Allergic rhinitis, unspecified: Secondary | ICD-10-CM

## 2014-02-20 ENCOUNTER — Ambulatory Visit: Payer: Medicare Other

## 2014-02-21 ENCOUNTER — Ambulatory Visit (INDEPENDENT_AMBULATORY_CARE_PROVIDER_SITE_OTHER): Payer: Medicare Other

## 2014-02-21 DIAGNOSIS — J309 Allergic rhinitis, unspecified: Secondary | ICD-10-CM

## 2014-02-27 ENCOUNTER — Ambulatory Visit: Payer: Medicare Other

## 2014-02-28 ENCOUNTER — Encounter: Payer: Self-pay | Admitting: Internal Medicine

## 2014-02-28 ENCOUNTER — Ambulatory Visit (INDEPENDENT_AMBULATORY_CARE_PROVIDER_SITE_OTHER): Payer: Medicare Other

## 2014-02-28 DIAGNOSIS — J309 Allergic rhinitis, unspecified: Secondary | ICD-10-CM

## 2014-03-05 ENCOUNTER — Ambulatory Visit (INDEPENDENT_AMBULATORY_CARE_PROVIDER_SITE_OTHER): Payer: Medicare Other

## 2014-03-05 DIAGNOSIS — J309 Allergic rhinitis, unspecified: Secondary | ICD-10-CM

## 2014-03-08 ENCOUNTER — Telehealth: Payer: Self-pay | Admitting: Internal Medicine

## 2014-03-08 ENCOUNTER — Ambulatory Visit (INDEPENDENT_AMBULATORY_CARE_PROVIDER_SITE_OTHER): Payer: Medicare Other

## 2014-03-08 DIAGNOSIS — J309 Allergic rhinitis, unspecified: Secondary | ICD-10-CM

## 2014-03-08 NOTE — Telephone Encounter (Signed)
lmomtcb x1 

## 2014-03-09 MED ORDER — ALBUTEROL SULFATE HFA 108 (90 BASE) MCG/ACT IN AERS: INHALATION_SPRAY | RESPIRATORY_TRACT | Status: DC

## 2014-03-09 NOTE — Telephone Encounter (Signed)
Pt aware that [facility] for Albut[facility]ol HFA.  Nothing further needed.

## 2014-03-09 NOTE — Telephone Encounter (Signed)
lmomtcb x 2  

## 2014-03-13 ENCOUNTER — Encounter: Payer: Self-pay | Admitting: Internal Medicine

## 2014-03-15 ENCOUNTER — Ambulatory Visit (INDEPENDENT_AMBULATORY_CARE_PROVIDER_SITE_OTHER): Payer: Medicare Other

## 2014-03-15 DIAGNOSIS — J309 Allergic rhinitis, unspecified: Secondary | ICD-10-CM

## 2014-03-19 ENCOUNTER — Ambulatory Visit: Payer: Medicare Other

## 2014-03-23 ENCOUNTER — Ambulatory Visit (INDEPENDENT_AMBULATORY_CARE_PROVIDER_SITE_OTHER): Payer: Medicare Other

## 2014-03-23 DIAGNOSIS — J309 Allergic rhinitis, unspecified: Secondary | ICD-10-CM

## 2014-03-29 ENCOUNTER — Ambulatory Visit (INDEPENDENT_AMBULATORY_CARE_PROVIDER_SITE_OTHER): Payer: Medicare Other

## 2014-03-29 DIAGNOSIS — J309 Allergic rhinitis, unspecified: Secondary | ICD-10-CM

## 2014-04-05 ENCOUNTER — Ambulatory Visit: Payer: Medicare Other

## 2014-04-06 ENCOUNTER — Ambulatory Visit (INDEPENDENT_AMBULATORY_CARE_PROVIDER_SITE_OTHER): Payer: Medicare Other

## 2014-04-06 DIAGNOSIS — J309 Allergic rhinitis, unspecified: Secondary | ICD-10-CM

## 2014-04-13 ENCOUNTER — Ambulatory Visit (INDEPENDENT_AMBULATORY_CARE_PROVIDER_SITE_OTHER): Payer: Medicare Other

## 2014-04-13 DIAGNOSIS — J309 Allergic rhinitis, unspecified: Secondary | ICD-10-CM

## 2014-04-18 ENCOUNTER — Ambulatory Visit (INDEPENDENT_AMBULATORY_CARE_PROVIDER_SITE_OTHER): Payer: Medicare Other

## 2014-04-18 DIAGNOSIS — J309 Allergic rhinitis, unspecified: Secondary | ICD-10-CM

## 2014-04-25 ENCOUNTER — Ambulatory Visit: Payer: Medicare Other

## 2014-04-26 ENCOUNTER — Ambulatory Visit (INDEPENDENT_AMBULATORY_CARE_PROVIDER_SITE_OTHER): Payer: Medicare Other

## 2014-04-26 DIAGNOSIS — J309 Allergic rhinitis, unspecified: Secondary | ICD-10-CM

## 2014-05-02 ENCOUNTER — Encounter: Payer: Self-pay | Admitting: Internal Medicine

## 2014-05-03 ENCOUNTER — Ambulatory Visit (AMBULATORY_SURGERY_CENTER): Payer: Self-pay | Admitting: *Deleted

## 2014-05-03 ENCOUNTER — Ambulatory Visit: Payer: Medicare Other

## 2014-05-03 VITALS — Ht 67.0 in | Wt 106.4 lb

## 2014-05-03 DIAGNOSIS — Z1211 Encounter for screening for malignant neoplasm of colon: Secondary | ICD-10-CM

## 2014-05-03 MED ORDER — MOVIPREP 100 G PO SOLR: ORAL | Status: DC

## 2014-05-03 NOTE — Progress Notes (Signed)
No egg or soy allergy  No anesthesia or intubation problems per pt  No diet medications taken   

## 2014-05-04 ENCOUNTER — Ambulatory Visit (INDEPENDENT_AMBULATORY_CARE_PROVIDER_SITE_OTHER): Payer: Medicare Other

## 2014-05-04 DIAGNOSIS — J309 Allergic rhinitis, unspecified: Secondary | ICD-10-CM

## 2014-05-11 ENCOUNTER — Ambulatory Visit (INDEPENDENT_AMBULATORY_CARE_PROVIDER_SITE_OTHER): Payer: Medicare Other

## 2014-05-11 DIAGNOSIS — J309 Allergic rhinitis, unspecified: Secondary | ICD-10-CM

## 2014-05-14 ENCOUNTER — Telehealth: Payer: Self-pay | Admitting: Internal Medicine

## 2014-05-14 DIAGNOSIS — Z1211 Encounter for screening for malignant neoplasm of colon: Secondary | ICD-10-CM

## 2014-05-14 NOTE — Telephone Encounter (Signed)
[facility]

## 2014-05-16 ENCOUNTER — Ambulatory Visit (AMBULATORY_SURGERY_CENTER): Payer: Medicare Other | Admitting: Internal Medicine

## 2014-05-16 ENCOUNTER — Encounter: Payer: Self-pay | Admitting: Internal Medicine

## 2014-05-16 VITALS — BP 131/87 | HR 65 | Temp 96.8°F | Resp 21

## 2014-05-16 DIAGNOSIS — Z1211 Encounter for screening for malignant neoplasm of colon: Secondary | ICD-10-CM

## 2014-05-16 MED ORDER — SODIUM CHLORIDE 0.9 % IV SOLN: 500.0000 mL | INTRAVENOUS | Status: DC

## 2014-05-16 NOTE — Progress Notes (Signed)
A/ox3, pleased with MAC, report to RN 

## 2014-05-16 NOTE — Op Note (Signed)
 [facility] [address] [place], [id number]   COLONOSCOPY PROCEDURE REPORT  PATIENT: Sara Albrightnthony, Zali T  MR#: [id number] BIRTHDATE: 1946/02/03 , 68  yrs. old GENDER: female ENDOSCOPIST: Hart Carwinora M Dezyrae Kensinger, MD REFERRED QM:VHQIBY:Dean Clovis RileyMitchell, M.D. PROCEDURE DATE:  05/16/2014 PROCEDURE:   Colonoscopy, screening First Screening Colonoscopy - Avg.  risk and is 50 yrs.  old or older - No.  Prior Negative Screening - Now for repeat screening. 10 or more years since last screening  History of Adenoma - Now for follow-up colonoscopy & has been > or = to 3 yrs.  N/A  Polyps Removed Today? No. ASA CLASS:   Class II INDICATIONS:average risk for colon cancer. MEDICATIONS: Monitored anesthesia care and Propofol 300 mg IV  DESCRIPTION OF PROCEDURE:   After the risks benefits and alternatives of the procedure were thoroughly explained, informed consent was obtained.  The digital rectal exam revealed no abnormalities of the rectum.   The LB PFC-H190 [id number]  endoscope was introduced through the anus and advanced to the cecum, which was identified by both the appendix and ileocecal valve. No adverse events experienced.   The quality of the prep was good, using MoviPrep  The instrument was then slowly withdrawn as the colon was fully examined.      COLON FINDINGS: A normal appearing cecum, ileocecal valve, and appendiceal orifice were identified.  The ascending, transverse, descending, sigmoid colon, and rectum appeared unremarkable. Retroflexed views revealed no abnormalities. The time to cecum=11 minutes 59 seconds.  Withdrawal time=6 minutes 00 seconds.  The scope was withdrawn and the procedure completed. COMPLICATIONS: There were no immediate complications.  ENDOSCOPIC IMPRESSION: Normal colonoscopy  RECOMMENDATIONS: Rrecall colonoscopy in 10 years High fiber diet  eSigned:  Hart Carwinora M Sabrin Dunlevy, MD 05/16/2014 8:54 AM   cc:

## 2014-05-16 NOTE — Patient Instructions (Signed)
YOU HAD AN ENDOSCOPIC PROCEDURE TODAY AT THE [facility]: Refer to the procedure report that was given to you for any specific questions about what was found during the examination.  If the procedure report does not answer your questions, please call your gastroenterologist to clarify.  If you requested that your care partner not be given the details of your procedure findings, then the procedure report has been included in a sealed envelope for you to review at your convenience later.  YOU SHOULD EXPECT: Some feelings of bloating in the abdomen. Passage of more gas than usual.  Walking can help get rid of the air that was put into your GI tract during the procedure and reduce the bloating. If you had a lower endoscopy (such as a colonoscopy or flexible sigmoidoscopy) you may notice spotting of blood in your stool or on the toilet paper. If you underwent a bowel prep for your procedure, then you may not have a normal bowel movement for a few days.  DIET: Your first meal following the procedure should be a light meal and then it is ok to progress to your normal diet.  A half-sandwich or bowl of soup is an example of a good first meal.  Heavy or fried foods are harder to digest and may make you feel nauseous or bloated.  Likewise meals heavy in dairy and vegetables can cause extra gas to form and this can also increase the bloating.  Drink plenty of fluids but you should avoid alcoholic beverages for 24 hours.  ACTIVITY: Your care partner should take you home directly after the procedure.  You should plan to take it easy, moving slowly for the rest of the day.  You can resume normal activity the day after the procedure however you should NOT DRIVE or use heavy machinery for 24 hours (because of the sedation medicines used during the test).    SYMPTOMS TO REPORT IMMEDIATELY: A gastroenterologist can be reached at any hour.  During normal business hours, 8:30 AM to 5:00 PM Monday through Friday,  call [phone].  After hours and on weekends, please call the GI answering service at [phone] who will take a message and have the physician on call contact you.   Following lower endoscopy (colonoscopy or flexible sigmoidoscopy):  Excessive amounts of blood in the stool  Significant tenderness or worsening of abdominal pains  Swelling of the abdomen that is new, acute  Fever of 100F or higher  FOLLOW UP: If any biopsies were taken you will be contacted by phone or by letter within the next 1-3 weeks.  Call your gastroenterologist if you have not heard about the biopsies in 3 weeks.  Our staff will call the home number listed on your records the next business day following your procedure to check on you and address any questions or concerns that you may have at that time regarding the information given to you following your procedure. This is a courtesy call and so if there is no answer at the home number and we have not heard from you through the emergency physician on call, we will assume that you have returned to your regular daily activities without incident.  SIGNATURES/CONFIDENTIALITY: You and/or your care partner have signed paperwork which will be entered into your electronic medical record.  These signatures attest to the fact that that the information above on your After Visit Summary has been reviewed and is understood.  Full responsibility of the confidentiality of this   discharge information lies with you and/or your care-partner.  Resume medications. 

## 2014-05-16 NOTE — Progress Notes (Signed)
Waiting for Dr. Brodie 

## 2014-05-17 ENCOUNTER — Telehealth: Payer: Self-pay

## 2014-05-17 NOTE — Telephone Encounter (Signed)
Left message did not hear answering machine. Phone rang numerous times and there was a pick up and no one answered. Just left message.

## 2014-05-18 ENCOUNTER — Ambulatory Visit: Payer: Medicare Other

## 2014-05-25 ENCOUNTER — Ambulatory Visit (INDEPENDENT_AMBULATORY_CARE_PROVIDER_SITE_OTHER): Payer: Medicare Other

## 2014-05-25 DIAGNOSIS — J309 Allergic rhinitis, unspecified: Secondary | ICD-10-CM

## 2014-05-30 ENCOUNTER — Ambulatory Visit: Payer: Medicare Other

## 2014-06-01 ENCOUNTER — Ambulatory Visit (INDEPENDENT_AMBULATORY_CARE_PROVIDER_SITE_OTHER): Payer: Medicare Other

## 2014-06-01 DIAGNOSIS — J309 Allergic rhinitis, unspecified: Secondary | ICD-10-CM

## 2014-06-08 ENCOUNTER — Ambulatory Visit (INDEPENDENT_AMBULATORY_CARE_PROVIDER_SITE_OTHER): Payer: Medicare Other

## 2014-06-08 DIAGNOSIS — J309 Allergic rhinitis, unspecified: Secondary | ICD-10-CM

## 2014-06-15 ENCOUNTER — Ambulatory Visit (INDEPENDENT_AMBULATORY_CARE_PROVIDER_SITE_OTHER): Payer: Medicare Other

## 2014-06-15 DIAGNOSIS — J309 Allergic rhinitis, unspecified: Secondary | ICD-10-CM

## 2014-06-22 ENCOUNTER — Ambulatory Visit (INDEPENDENT_AMBULATORY_CARE_PROVIDER_SITE_OTHER): Payer: Medicare Other

## 2014-06-22 DIAGNOSIS — J309 Allergic rhinitis, unspecified: Secondary | ICD-10-CM

## 2014-06-28 ENCOUNTER — Ambulatory Visit (INDEPENDENT_AMBULATORY_CARE_PROVIDER_SITE_OTHER): Payer: Medicare Other

## 2014-06-28 DIAGNOSIS — J309 Allergic rhinitis, unspecified: Secondary | ICD-10-CM

## 2014-07-04 ENCOUNTER — Ambulatory Visit (INDEPENDENT_AMBULATORY_CARE_PROVIDER_SITE_OTHER): Payer: Medicare Other

## 2014-07-04 DIAGNOSIS — J309 Allergic rhinitis, unspecified: Secondary | ICD-10-CM | POA: Diagnosis not present

## 2014-07-06 ENCOUNTER — Ambulatory Visit (INDEPENDENT_AMBULATORY_CARE_PROVIDER_SITE_OTHER): Payer: Medicare Other

## 2014-07-06 DIAGNOSIS — J309 Allergic rhinitis, unspecified: Secondary | ICD-10-CM

## 2014-07-10 ENCOUNTER — Ambulatory Visit (INDEPENDENT_AMBULATORY_CARE_PROVIDER_SITE_OTHER): Payer: Medicare Other

## 2014-07-10 DIAGNOSIS — J309 Allergic rhinitis, unspecified: Secondary | ICD-10-CM | POA: Diagnosis not present

## 2014-07-17 ENCOUNTER — Ambulatory Visit: Payer: Medicare Other

## 2014-07-19 ENCOUNTER — Ambulatory Visit (INDEPENDENT_AMBULATORY_CARE_PROVIDER_SITE_OTHER): Payer: Medicare Other

## 2014-07-19 DIAGNOSIS — J309 Allergic rhinitis, unspecified: Secondary | ICD-10-CM | POA: Diagnosis not present

## 2014-07-27 ENCOUNTER — Ambulatory Visit (INDEPENDENT_AMBULATORY_CARE_PROVIDER_SITE_OTHER): Payer: Medicare Other

## 2014-07-27 DIAGNOSIS — J309 Allergic rhinitis, unspecified: Secondary | ICD-10-CM | POA: Diagnosis not present

## 2014-07-31 ENCOUNTER — Ambulatory Visit: Payer: Medicare Other | Admitting: Internal Medicine

## 2014-08-03 ENCOUNTER — Ambulatory Visit (INDEPENDENT_AMBULATORY_CARE_PROVIDER_SITE_OTHER): Payer: Medicare Other

## 2014-08-03 DIAGNOSIS — J309 Allergic rhinitis, unspecified: Secondary | ICD-10-CM

## 2014-08-10 ENCOUNTER — Ambulatory Visit (INDEPENDENT_AMBULATORY_CARE_PROVIDER_SITE_OTHER): Payer: Medicare Other

## 2014-08-10 DIAGNOSIS — J309 Allergic rhinitis, unspecified: Secondary | ICD-10-CM | POA: Diagnosis not present

## 2014-08-17 ENCOUNTER — Ambulatory Visit (INDEPENDENT_AMBULATORY_CARE_PROVIDER_SITE_OTHER): Payer: Medicare Other

## 2014-08-17 DIAGNOSIS — J309 Allergic rhinitis, unspecified: Secondary | ICD-10-CM | POA: Diagnosis not present

## 2014-08-20 ENCOUNTER — Encounter: Payer: Self-pay | Admitting: Internal Medicine

## 2014-08-24 ENCOUNTER — Ambulatory Visit (INDEPENDENT_AMBULATORY_CARE_PROVIDER_SITE_OTHER): Payer: Medicare Other

## 2014-08-24 DIAGNOSIS — J309 Allergic rhinitis, unspecified: Secondary | ICD-10-CM | POA: Diagnosis not present

## 2014-08-31 ENCOUNTER — Ambulatory Visit (INDEPENDENT_AMBULATORY_CARE_PROVIDER_SITE_OTHER): Payer: Medicare Other

## 2014-08-31 DIAGNOSIS — J309 Allergic rhinitis, unspecified: Secondary | ICD-10-CM | POA: Diagnosis not present

## 2014-09-07 ENCOUNTER — Ambulatory Visit (INDEPENDENT_AMBULATORY_CARE_PROVIDER_SITE_OTHER): Payer: Medicare Other

## 2014-09-07 DIAGNOSIS — J309 Allergic rhinitis, unspecified: Secondary | ICD-10-CM

## 2014-09-11 ENCOUNTER — Ambulatory Visit (INDEPENDENT_AMBULATORY_CARE_PROVIDER_SITE_OTHER): Payer: Medicare Other

## 2014-09-11 DIAGNOSIS — J309 Allergic rhinitis, unspecified: Secondary | ICD-10-CM | POA: Diagnosis not present

## 2014-09-28 ENCOUNTER — Ambulatory Visit (INDEPENDENT_AMBULATORY_CARE_PROVIDER_SITE_OTHER): Payer: Medicare Other

## 2014-09-28 DIAGNOSIS — J309 Allergic rhinitis, unspecified: Secondary | ICD-10-CM

## 2014-10-01 ENCOUNTER — Ambulatory Visit: Payer: Medicare Other | Admitting: Internal Medicine

## 2014-10-04 ENCOUNTER — Ambulatory Visit: Payer: Medicare Other

## 2014-10-05 ENCOUNTER — Ambulatory Visit (INDEPENDENT_AMBULATORY_CARE_PROVIDER_SITE_OTHER): Payer: Medicare Other

## 2014-10-05 DIAGNOSIS — J309 Allergic rhinitis, unspecified: Secondary | ICD-10-CM | POA: Diagnosis not present

## 2014-10-15 ENCOUNTER — Ambulatory Visit: Payer: Medicare Other

## 2014-10-17 ENCOUNTER — Ambulatory Visit (INDEPENDENT_AMBULATORY_CARE_PROVIDER_SITE_OTHER): Payer: Medicare Other

## 2014-10-17 DIAGNOSIS — J309 Allergic rhinitis, unspecified: Secondary | ICD-10-CM

## 2014-10-24 ENCOUNTER — Ambulatory Visit: Payer: Medicare Other

## 2014-10-25 ENCOUNTER — Ambulatory Visit: Payer: Medicare Other | Admitting: Internal Medicine

## 2014-10-26 ENCOUNTER — Ambulatory Visit (INDEPENDENT_AMBULATORY_CARE_PROVIDER_SITE_OTHER): Payer: Medicare Other

## 2014-10-26 DIAGNOSIS — J309 Allergic rhinitis, unspecified: Secondary | ICD-10-CM | POA: Diagnosis not present

## 2014-11-02 ENCOUNTER — Ambulatory Visit (INDEPENDENT_AMBULATORY_CARE_PROVIDER_SITE_OTHER): Payer: Medicare Other

## 2014-11-02 DIAGNOSIS — J309 Allergic rhinitis, unspecified: Secondary | ICD-10-CM

## 2014-11-05 ENCOUNTER — Ambulatory Visit (INDEPENDENT_AMBULATORY_CARE_PROVIDER_SITE_OTHER): Payer: Medicare Other

## 2014-11-05 ENCOUNTER — Telehealth: Payer: Self-pay | Admitting: Internal Medicine

## 2014-11-05 DIAGNOSIS — J309 Allergic rhinitis, unspecified: Secondary | ICD-10-CM | POA: Diagnosis not present

## 2014-11-05 NOTE — Telephone Encounter (Signed)
Date Mixed: 11/05/2014 Vial: A Strength: 1:10 Here/Mail/Pick Up: Here Mixed By: Lindsay Lemons, CMA 

## 2014-11-09 ENCOUNTER — Ambulatory Visit (INDEPENDENT_AMBULATORY_CARE_PROVIDER_SITE_OTHER): Payer: Medicare Other

## 2014-11-09 DIAGNOSIS — J309 Allergic rhinitis, unspecified: Secondary | ICD-10-CM | POA: Diagnosis not present

## 2014-11-16 ENCOUNTER — Ambulatory Visit (INDEPENDENT_AMBULATORY_CARE_PROVIDER_SITE_OTHER): Payer: Medicare Other

## 2014-11-16 DIAGNOSIS — J309 Allergic rhinitis, unspecified: Secondary | ICD-10-CM

## 2014-11-19 ENCOUNTER — Encounter: Payer: Self-pay | Admitting: Internal Medicine

## 2014-11-21 ENCOUNTER — Ambulatory Visit: Payer: Medicare Other

## 2014-11-28 ENCOUNTER — Ambulatory Visit (INDEPENDENT_AMBULATORY_CARE_PROVIDER_SITE_OTHER): Payer: Medicare Other

## 2014-11-28 DIAGNOSIS — J309 Allergic rhinitis, unspecified: Secondary | ICD-10-CM

## 2014-12-05 ENCOUNTER — Ambulatory Visit: Payer: Medicare Other

## 2014-12-07 ENCOUNTER — Ambulatory Visit (INDEPENDENT_AMBULATORY_CARE_PROVIDER_SITE_OTHER): Payer: Medicare Other

## 2014-12-07 DIAGNOSIS — J309 Allergic rhinitis, unspecified: Secondary | ICD-10-CM | POA: Diagnosis not present

## 2014-12-13 ENCOUNTER — Encounter: Payer: Self-pay | Admitting: Internal Medicine

## 2014-12-13 ENCOUNTER — Ambulatory Visit (INDEPENDENT_AMBULATORY_CARE_PROVIDER_SITE_OTHER): Payer: Medicare Other

## 2014-12-13 DIAGNOSIS — J309 Allergic rhinitis, unspecified: Secondary | ICD-10-CM | POA: Diagnosis not present

## 2014-12-21 ENCOUNTER — Ambulatory Visit (INDEPENDENT_AMBULATORY_CARE_PROVIDER_SITE_OTHER)
Admission: RE | Admit: 2014-12-21 | Discharge: 2014-12-21 | Disposition: A | Discharge status: Home / Self Care | Payer: Medicare Other | Source: Ambulatory Visit | Attending: Internal Medicine | Admitting: Internal Medicine

## 2014-12-21 ENCOUNTER — Ambulatory Visit (INDEPENDENT_AMBULATORY_CARE_PROVIDER_SITE_OTHER): Payer: Medicare Other | Admitting: Internal Medicine

## 2014-12-21 ENCOUNTER — Ambulatory Visit (INDEPENDENT_AMBULATORY_CARE_PROVIDER_SITE_OTHER): Payer: Medicare Other

## 2014-12-21 ENCOUNTER — Encounter: Payer: Self-pay | Admitting: Internal Medicine

## 2014-12-21 ENCOUNTER — Other Ambulatory Visit (INDEPENDENT_AMBULATORY_CARE_PROVIDER_SITE_OTHER): Payer: Medicare Other

## 2014-12-21 VITALS — BP 116/72 | HR 79 | Ht 67.0 in | Wt 101.4 lb

## 2014-12-21 DIAGNOSIS — J479 Bronchiectasis, uncomplicated: Secondary | ICD-10-CM

## 2014-12-21 DIAGNOSIS — J449 Chronic obstructive pulmonary disease, unspecified: Secondary | ICD-10-CM

## 2014-12-21 DIAGNOSIS — J310 Chronic rhinitis: Secondary | ICD-10-CM | POA: Diagnosis not present

## 2014-12-21 DIAGNOSIS — J329 Chronic sinusitis, unspecified: Secondary | ICD-10-CM | POA: Diagnosis not present

## 2014-12-21 DIAGNOSIS — J309 Allergic rhinitis, unspecified: Secondary | ICD-10-CM | POA: Diagnosis not present

## 2014-12-21 LAB — ANGIOTENSIN CONVERTING ENZYME: ANGIOTENSIN-CONVERTING ENZYME: 22 U/L (ref 8–52)

## 2014-12-21 LAB — CBC WITH DIFFERENTIAL/PLATELET
BASOS ABS: 0 10*3/uL (ref 0.0–0.1)
Basophils Relative: 0.4 % (ref 0.0–3.0)
Eosinophils Absolute: 0.4 10*3/uL (ref 0.0–0.7)
Eosinophils Relative: 5.2 % — ABNORMAL HIGH (ref 0.0–5.0)
HCT: 43.9 % (ref 36.0–46.0)
Hemoglobin: 14.6 g/dL (ref 12.0–15.0)
LYMPHS ABS: 1.7 10*3/uL (ref 0.7–4.0)
Lymphocytes Relative: 24.5 % (ref 12.0–46.0)
MCHC: 33.1 g/dL (ref 30.0–36.0)
MCV: 92.1 fl (ref 78.0–100.0)
MONOS PCT: 6.3 % (ref 3.0–12.0)
Monocytes Absolute: 0.4 10*3/uL (ref 0.1–1.0)
NEUTROS PCT: 63.6 % (ref 43.0–77.0)
Neutro Abs: 4.3 10*3/uL (ref 1.4–7.7)
Platelets: 281 10*3/uL (ref 150.0–400.0)
RBC: 4.77 Mil/uL (ref 3.87–5.11)
RDW: 15.4 % (ref 11.5–15.5)
WBC: 6.8 10*3/uL (ref 4.0–10.5)

## 2014-12-21 LAB — SEDIMENTATION RATE: SED RATE: 16 mm/h (ref 0–22)

## 2014-12-21 NOTE — Progress Notes (Signed)
Patient ID: Sara Beasley, female    DOB: 09/12/1945, 69 y.o.   MRN: [id number]  HPI 08/22/10- [age] yoF remote smoker, referred courtesy of Dr Haroldine Lawsrossley for allergy evaluation of chronic / recurrent rhinosinusitis. She had no such problems before 5 years ago. At that time she developed nasal congestion and an apparent sinus infection after helping move her mother out of a dusty apartment. Symptoms cleared partially in the summer. Dr Haroldine Lawsrossley did sinus surgery 2 years ago- septoplasty/ turbinate reduction. She drained well after that, but continued to get apparent infections. Serial antibiotics. Usually feels congested, worse in winter. Nasal sprays and saline rinses have been little help. Usually does not feel that she is catching a cold at onset and does not get chest congestion, cough or wheeze, ear pain or itching/ sneezing. Has never recognized seasonal allergic rhinitis.No hx of asthma or ear infections.   10/03/10 - chronic/ recurrent rhinosinusitis Allergy profile last visit- IgE 60 with significant grass and weed elevations. She sees winter predominance of symptoms, finding little effect of pollen and no benefit from singulair or allegra. She thinks infection is fading, with little discolored any more. Gets fairly regular postnasal drip that will trigger some cough.   12/19/10  64 yoF remote smoker, referred courtesy of Dr Haroldine Lawsrossley for allergy evaluation of chronic / recurrent rhinosinusitis. She called wanting treatment before hiking trip.  She reported trying a series of  remedies. She is now doing Singulair, Nasonex and Sudafed 1/2 x 240mg . Notes drainage and cough with yellow and throat clearing, rather than stuffiness. It is much less intense than it was.  Hx  of bronchopneumonia in 2004 by CT and CXR, with no f/u since.  Allergy profile had shown IgE 60 with elevations. She thinks she may be interested in skin testing in the future.   01/19/11-  64 yoF remote smoker, referred courtesy of  Dr Haroldine Lawsrossley for allergy evaluation of chronic / recurrent rhinosinusitis. Since last here [place] weather and felt more nasal congestion. Now in last few days she has been off meds for allergy testing, and weather is much cooler. She notices more runny, stuffy nose, throat clearing and sinus pressure. Chest not changed-ok.  Skin test-significant positive reactions for grass, tree, and weed pollens, dust mite, cockroach. She considers winter her worst season. We agreed to have her go back on her current maintenance medicines and see how she does this winter. We did in talk in detail about the possibility of allergy vaccine including realistic descriptions of logistics, risks and benefits.  05/22/11- 64 yoF remote smoker, referred courtesy of Dr Haroldine Lawsrossley for allergy evaluation of chronic / recurrent rhinosinusitis. She is hopeful allergy vaccine is contributing to the improvement she notices. She recognizes also the drier weather is helpful. Has not had a significant respiratory infection this winter. Still taking Allegra. Did have a sinusitis in October treated with Cleocin. Nasal discharge is still occasionally a little purulent with some blood but no pain or fever. She denies headache. Off of all nasal sprays now. Daily cough starts about 6 p.m. Occasional use of saline nasal rinse.  12/03/11- 64 yoF remote smoker, referred courtesy of Dr Haroldine Lawsrossley for allergy evaluation of chronic / recurrent rhinosinusitis Still on vaccine 1:50 GH and doing well; unable to tell any difference if using allegra or Zyrtec; no sinus infection in about 8 months!!!! Still having cough and drainage though-starts late morning and will last about every 2-3 hours-not much  trouble at night. She likes the way she is doing now and doesn't want to "rock the boat".  02/24/13- 67 yoF remote smoker, referred courtesy of Dr Haroldine Lawsrossley for allergy evaluation of chronic / recurrent rhinosinusitis FOLLOWS  FOR: still on Allergy vaccine 1:50 GH; can't tell if its helping much, spring and fall are the worst times of year for paitient. Increased drainage and nasal congestion. Rhinitis is perennial, not just in grass pollen season. Allergy profile had been significant for elevated grass pollen IgE. She saw Dr. Haroldine Lawsrossley for one sinus infection in the fall. He increase Singulair to 10 mg daily. She disliked Dymista. Nasal sprays are "harsh". We discussed Grastek.   05/2513- 67 yoF remote smoker, referred courtesy of Dr Haroldine Lawsrossley for allergy evaluation of chronic / recurrent rhinosinusitis Follows For: Treated for sinus infection in December with Zpak then Clindamycin - Still c/o prod cough (yellow- green) - Chest congestion - Sinus drainage (occas green) - Some SOB on exertion - Denies wheezing, fever, sweat, nodes. Carries kleenex to blow nose. Continue Allergy Vaccine 1:10 GO. Allergy Profile 02/24/13- Total IgE 53.3, positive esp grass and tree pollens  07/17/13- 67 yoF remote smoker, followed forf chronic / recurrent rhinosinusitis, pulmonary infiltrates, emphysema,bronchiectasis, L nodule,  H.flu.  FOLLOWS FOR: patient following up for results of CT and labs per CY. Allergy Vaccine 1:10 GH doing well Sputum cultured H.flu- we sent doxy 07/12/13 Cough and night sweats that began bothering her this winter have markedly improved on doxycycline. Now feels well. Still minor cough at night lying down- minimal Labs reviewed- CBC wnl x platelets 462K. LFT and BMET WNL, Quant TB Neg. CXR and CT reviewed- diffuse inflammatory stranding and scarring. CT chest 06/26/13   ADDENDUM REPORT: 07/03/2013 09:36  ADDENDUM:  The study was reviewed by Dr. Fredirick LatheBlietz, who suggests MAC as the most  likely diagnosis, ILD less likely.  Electronically Signed  By: Oley Balmaniel Hassell M.D.  On: 07/03/2013 09:36     CLINICAL DATA: Pulmonary fibrosis. Cough and congestion.  EXAM:  CT CHEST WITH CONTRAST  TECHNIQUE:  Multidetector CT  imaging of the chest was performed during  intravenous contrast administration.  CONTRAST: 80mL OMNIPAQUE IOHEXOL 300 MG/ML SOLN  COMPARISON: 03/09/2003  FINDINGS:  Progressive subpleural blebs in the lung apices, with opacification  of some of the right apical components demonstrating fluid levels.  Progressive pleural-based consolidation in the posterior segment  right upper lobe and extensively throughout the subpleural aspect of  the right middle lobe. There is bronchiectasis in the right lower  lobe with more scattered areas of mostly pleural-based airspace  consolidation in the posterior and lateral basal segments right  lower lobe.  On the left, progression of coarse linear opacities in basilar  segments with some patchy subpleural areas of consolidation. There  is a new nodular masslike area of consolidation measured 16 mm along  the major fissure. Small pleural-based regions of consolidation in  the upper lobe predominately at the apex.  Subcentimeter AP window and precarinal lymph nodes are more  conspicuous. No hilar adenopathy. Low-attenuation liver lesions are  stable or smaller than on previous exam, likely cysts but  incompletely characterized. Remainder visualized upper abdomen  unremarkable. Thoracic spine and sternum intact.  IMPRESSION:  1. Emphysema and bronchiectasis with interval progression of  peripheral nodular consolidation, probably primarily  fibrotic/postinflammatory although fluid levels in right apical  disease suggest infectious component.  2. Follow-up suggested after symptom resolution to confirm  appropriate clearance, exclude neoplasm.  Electronically Signed:  By: Oley Balm M.D.  On: 06/30/2013 16:20   08/08/13- 67 yoF remote smoker, followed forf chronic / recurrent rhinosinusitis, pulmonary infiltrates, emphysema,bronchiectasis, L nodule,  H.flu.  FOLLOWS FOR: still having yellow-green phelgm, cough is different. Everything seems loosen and  deeper. Feels better. Still on Allergy vaccine 1:10 GO Noticing minor nasal stuffiness now with the spring pollen season but feels controlled. Breathing feels better. More stamina during exercise. Cough is looser, still with occasional scant green. Energy is better. Denies fever. Occasional postmenopausal sweat.  Denies chest pain, palpitation, blood, adenopathy, rash. Finishing last round of antibiotics (doxycycline, then Biaxin). Symptoms flared some when she ended doxycycline so she wants to be sure to finish complete antibiotic course. We reviewed labs and imaging. MAIC had been suggested by radiology but AFB cultures negative .She says she has considered, and wants to defer bronchoscopy. She doesn't like the idea of any invasive test in her body. We agreed to repeat chest x-ray after completion of Biaxin.  09/14/13-67 yoF remote smoker, followed for chronic / recurrent rhinosinusitis, pulmonary infiltrates, emphysema,bronchiectasis, L nodule, 1 episode heme, cx'd  H.flu.  She had reported hemoptysis 08/14/13 and we sent Biaxin then augmentin, planning f/u CXR. Pt states breathing has improved since last visit. Pt states cough and SOB have improved. Denies CP.  She now says episode of hemoptysis happened after she choked on some chicken and coughed very hard. Denies any further bleeding. She wants to defer chest x-ray today. Reports 14,000 steps on her FitBit yesterday  01/23/14- 68 yoF remote smoker, followed for chronic / recurrent rhinosinusitis, pulmonary infiltrates, emphysema,bronchiectasis, L nodule, 1 episode heme, cx'd  H.flu.  CXR 01/22/14 IMPRESSION:  Slight improvement of nodular opacity in the right mid lung with  otherwise unchanged appearance of the lungs as above.  Electronically Signed  By: Sebastian Ache  On: 01/22/2014 14:19  12/21/14- 68 yoF remote smoker, followed for chronic / recurrent rhinosinusitis, pulmonary infiltrates, emphysema,bronchiectasis, L nodule, 1 episode heme,  cx'd  H.flu FOLLOWS WUJ:[id number] on Allergy vaccine and doing well; pt states her breathing is about the same-unsure if she has asthma-noticed slight wheezing-smoke sets off, temp changes. Congestion at night as well. Continues to have SOB and cough.   Husband okay after recent MI with 3 stents. 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 Denies night sweats, fever, purulent sputum, arthralgias, rash or significant sinus discomfort. Still active and goes to gym. Office Spirometry 12/18/2014-very severe obstructive airways disease- FVC 1.61/49%, FEV1 0.70/29%, FEV1/FVC 0.44, FEF 25-75 percent 0.34/16%.  Review of Systems- See HPI Constitutional:   No weight loss, night sweats,  Fevers, chills, fatigue, lassitude. HEENT: No headaches,  Difficulty swallowing,  Tooth/dental problems,  Sore throat,                No sneezing, itching, ear ache, CV:  No chest pain, orthopnea, PND, swelling in lower extremities, anasarca, dizziness, palpitations GI  No heartburn, indigestion, abdominal pain, nausea, vomiting,  Resp: No shortness of breath with exertion or at rest.  No excess mucus, +minimal productive cough, +Minimal non-productive cough,  No coughing up of blood.  No change in color of mucus. No- wheezing Skin: no rash or lesions. GU: MS:  No joint pain or swelling.  No decreased range of motion.  No back pain. Psych:  No change in mood  or affect. No depression or anxiety.  No memory loss.  Objective:   Physical Exam General- Alert, Oriented, Affect-appropriate, Distress- none acute,  + very thin, + looks well Skin- rash-none, lesions- none, excoriation- none Lymphadenopathy- none Head- atraumatic            Eyes- Gross vision intact, PERRLA, conjunctivae clear secretions            Ears-  Hearing, canals normal            Nose- clear., no-Septal dev,  polyps, erosion, perforation;   mucus bridging            Throat- Mallampati II , mucosa clear, dry;  drainage- none, tonsils- atrophic.  Neck- flexible , trachea midline, no stridor , thyroid nl, carotid no bruit Chest - symmetrical excursion , unlabored           Heart/CV- RRR , no murmur , no gallop  , no rub, nl s1 s2                           - JVD- none , edema- none, stasis changes- none, varices- none           Lung- +bilateral mild rhonchi upper zones, unlabored, wheeze- none, cough-none,                                       dullness-none, rub- none           Chest wall-  Abd- Br/ Gen/ Rectal- Not done, not indicated Extrem- cyanosis- none, clubbing, none, atrophy- none, strength- nl Neuro- grossly intact to observation

## 2014-12-21 NOTE — Patient Instructions (Addendum)
Order- CXR  Dx bronchiectasis, chronic sinusitis                        Lab- CBC w diff, Sed rate, ANCA, ACE level, sed rate, IgE          Order- Office spirometry

## 2014-12-23 NOTE — Assessment & Plan Note (Signed)
This was her original complaint when I first met her, but has usually not been a significant discomfort in recent years.

## 2014-12-23 NOTE — Assessment & Plan Note (Addendum)
Comparing CT from 2004 when there were areas of inflammation but less emphysema, have questioned possibility of MAIC/atypical AFB. She rarely coughs up anything,  notices only occasional mild night sweats thought to be leftover from menopause, and sputum culture and 2015 was negative for AFB, positive for H. influenzae. She had not previously wanted bronchoscopy but it may be appropriate to offer this again seeking a treatable cause. Plan-labs to look once more for destructive inflammatory disorders or evidence of an immune/allergy problem that we could address more specifically.

## 2014-12-24 LAB — IGE: IGE (IMMUNOGLOBULIN E), SERUM: 147 kU/L — AB (ref <115)

## 2014-12-25 LAB — ANCA SCREEN W REFLEX TITER: ANCA SCREEN: POSITIVE — AB

## 2014-12-25 LAB — C-ANCA TITER: C-ANCA: 1:80 {titer} — ABNORMAL HIGH

## 2014-12-26 ENCOUNTER — Telehealth: Payer: Self-pay | Admitting: Internal Medicine

## 2014-12-26 MED ORDER — UMECLIDINIUM-VILANTEROL 62.5-25 MCG/INH IN AEPB: 1.0000 | INHALATION_SPRAY | Freq: Every day | RESPIRATORY_TRACT | Status: DC

## 2014-12-26 NOTE — Telephone Encounter (Signed)
lmomtcb x1 

## 2014-12-26 NOTE — Telephone Encounter (Signed)
Pt called office back and stated that sample of Anoro was given to her at last OV and was calling office to let Dr Delena Bali know it was working Pt requested that rx be sent to [facility] Per Florentina Addison, ok to send script to pharmacy  Script sent to pt's pharmacy  Nothing further is needed

## 2014-12-28 ENCOUNTER — Ambulatory Visit (INDEPENDENT_AMBULATORY_CARE_PROVIDER_SITE_OTHER): Payer: Medicare Other

## 2014-12-28 DIAGNOSIS — J309 Allergic rhinitis, unspecified: Secondary | ICD-10-CM

## 2015-01-02 ENCOUNTER — Telehealth: Payer: Self-pay | Admitting: Internal Medicine

## 2015-01-02 NOTE — Telephone Encounter (Signed)
lmtcb for pt.  

## 2015-01-03 NOTE — Telephone Encounter (Signed)
lmtcb for pt.  

## 2015-01-03 NOTE — Telephone Encounter (Signed)
Notes Recorded by Waymon Budge, MD on 12/24/2014 at 1:01 PM CXR- severe emphysema with areas of scarring. Probably not changed since last images, but worse than oldest CT from 2004. Notes Recorded by Waymon Budge, MD on 01/01/2015 at 3:34 PM Labs- There are mild abnormalities on recent labs- eosinophil cell count is a little high and IgE elevated. These can indicate an allergic process. Mild elevation of a blood vessel inflammation marker called "ANCA". I don't think these explain the severe emphysema and chronic bronchitis pattern seen on Xrays. We will go over every thing at next visit in January. Would continue present treatment as long as feeling ok, but call as needed. ----  lmtcb x2

## 2015-01-03 NOTE — Telephone Encounter (Signed)
[phone] til 3 then she has pts

## 2015-01-03 NOTE — Telephone Encounter (Signed)
Pt returned call [phone]  Pt will try to call back. No need to try to call her.

## 2015-01-04 ENCOUNTER — Ambulatory Visit (INDEPENDENT_AMBULATORY_CARE_PROVIDER_SITE_OTHER): Payer: Medicare Other

## 2015-01-04 DIAGNOSIS — J309 Allergic rhinitis, unspecified: Secondary | ICD-10-CM

## 2015-01-04 NOTE — Telephone Encounter (Signed)
Pt returned call. Made pt work in appt on 9/23 at 11:15 with CY per KW note.  Nothing further is needed at this time.

## 2015-01-04 NOTE — Telephone Encounter (Signed)
Pt in lobby to get results

## 2015-01-04 NOTE — Telephone Encounter (Signed)
lmomtcb x1 for pt on VM 

## 2015-01-04 NOTE — Telephone Encounter (Signed)
Spoke with pt regarding results. She has lots of questions for Dr. Maple Hudson and is requesting to be worked in to see him. She reports she currently does not have a treatment plan. She stopped the anoro bc it did not work. Now only takes zyrtec and singulair. She reports waiting until January is not acceptable to discuss results. Please advise Dr. Delena Bali where pt can be worked in at? thanks

## 2015-01-04 NOTE — Telephone Encounter (Signed)
If patient is acute-having flare up then offer patient to come in on Wednesday 01-09-15 at 11:30am slot. If not currently having an acute flare up then have patient come in on Friday 01-18-15 at 11:15am. Thanks.

## 2015-01-11 ENCOUNTER — Ambulatory Visit (INDEPENDENT_AMBULATORY_CARE_PROVIDER_SITE_OTHER): Payer: Medicare Other

## 2015-01-11 ENCOUNTER — Telehealth: Payer: Self-pay | Admitting: Internal Medicine

## 2015-01-11 DIAGNOSIS — J309 Allergic rhinitis, unspecified: Secondary | ICD-10-CM | POA: Diagnosis not present

## 2015-01-11 MED ORDER — MONTELUKAST SODIUM 10 MG PO TABS: 5.0000 mg | ORAL_TABLET | Freq: Every day | ORAL | Status: DC

## 2015-01-11 NOTE — Telephone Encounter (Signed)
Rx has been sent in. Pt is aware. Nothing further was needed. 

## 2015-01-11 NOTE — Telephone Encounter (Signed)
Spoke with pt when she came in for her allergy vaccine. States that she needs a refill on Singulair. Dr. Haroldine Laws normally fills this for her but he is out of the office until October. Wants to know if CY will fill this for her.  CY - please advise. Thanks.

## 2015-01-11 NOTE — Telephone Encounter (Signed)
Ok to refill  singulair 10 mg, # 30, ref prn

## 2015-01-18 ENCOUNTER — Encounter: Payer: Self-pay | Admitting: Internal Medicine

## 2015-01-18 ENCOUNTER — Ambulatory Visit (INDEPENDENT_AMBULATORY_CARE_PROVIDER_SITE_OTHER): Payer: Medicare Other | Admitting: Internal Medicine

## 2015-01-18 ENCOUNTER — Ambulatory Visit (INDEPENDENT_AMBULATORY_CARE_PROVIDER_SITE_OTHER): Payer: Medicare Other

## 2015-01-18 VITALS — BP 122/80 | HR 87 | Ht 67.0 in | Wt 100.4 lb

## 2015-01-18 DIAGNOSIS — R768 Other specified abnormal immunological findings in serum: Secondary | ICD-10-CM | POA: Diagnosis not present

## 2015-01-18 DIAGNOSIS — J310 Chronic rhinitis: Secondary | ICD-10-CM

## 2015-01-18 DIAGNOSIS — J309 Allergic rhinitis, unspecified: Secondary | ICD-10-CM

## 2015-01-18 DIAGNOSIS — J449 Chronic obstructive pulmonary disease, unspecified: Secondary | ICD-10-CM

## 2015-01-18 DIAGNOSIS — Z23 Encounter for immunization: Secondary | ICD-10-CM

## 2015-01-18 MED ORDER — AZITHROMYCIN 250 MG PO TABS: ORAL_TABLET | ORAL | Status: DC

## 2015-01-18 NOTE — Assessment & Plan Note (Addendum)
CT 06/26/13- bronchiectasis, inflammation, nodule, emphysema Sputum Cx - H.flu CBC wnl x platelets 462K. LFT and BMET WNL, Quant TB Neg. a1AT wnl AFB sputum neg 2015 Office Spirometry 12/18/2014-very severe obstructive airways disease- FVC 1.61/49%, FEV1 0.70/29%, FEV1/FVC 0.44, FEF 25-75 percent 0.34/16% ANCA + 1:80, C-ANCA 12/21/2014 Sedimentation rate-16 Sputum cultures 2015-positive H influenza, negative AFB and fungal  Pattern over the last 5 years has been an evolving progression from patchy infiltrates with some nodularity to bullous emphysema with bronchiectasis, areas of consolidation and banding. Regarded as a chronic inflammatory disorder with the above lab results, I don't think this is typical asthma, emphysema, or overlap syndrome. She has previously not wanted bronchoscopy or invasive assessment. 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 She has not had significant peripheral eosinophilia.  We spent 30 minutes at today's visit reviewing radiology imaging and lab results. Expectorated sputum has not cultured an atypical organism although [facility] could do this. There is no evidence of kidney disease. I would like her to have formal PFTs and a tissue biopsy sufficient to detect angiitis. She really wants to try the prolonged azithromycin protocol suggested by the book she brought today and I agreed to try it. My recommendation was for [facility] referral and she is accepting of this now.

## 2015-01-18 NOTE — Patient Instructions (Addendum)
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 2006 on disk for patient to pick up and cary with her.

## 2015-01-18 NOTE — Progress Notes (Signed)
Patient ID: Sara Beasley, female    DOB: 09/12/1945, 69 y.o.   MRN: [id number]  HPI 08/22/10- [age] yoF remote smoker, referred courtesy of Dr Haroldine Lawsrossley for allergy evaluation of chronic / recurrent rhinosinusitis. She had no such problems before 5 years ago. At that time she developed nasal congestion and an apparent sinus infection after helping move her mother out of a dusty apartment. Symptoms cleared partially in the summer. Dr Haroldine Lawsrossley did sinus surgery 2 years ago- septoplasty/ turbinate reduction. She drained well after that, but continued to get apparent infections. Serial antibiotics. Usually feels congested, worse in winter. Nasal sprays and saline rinses have been little help. Usually does not feel that she is catching a cold at onset and does not get chest congestion, cough or wheeze, ear pain or itching/ sneezing. Has never recognized seasonal allergic rhinitis.No hx of asthma or ear infections.   10/03/10 - chronic/ recurrent rhinosinusitis Allergy profile last visit- IgE 60 with significant grass and weed elevations. She sees winter predominance of symptoms, finding little effect of pollen and no benefit from singulair or allegra. She thinks infection is fading, with little discolored any more. Gets fairly regular postnasal drip that will trigger some cough.   12/19/10  64 yoF remote smoker, referred courtesy of Dr Haroldine Lawsrossley for allergy evaluation of chronic / recurrent rhinosinusitis. She called wanting treatment before hiking trip.  She reported trying a series of  remedies. She is now doing Singulair, Nasonex and Sudafed 1/2 x 240mg . Notes drainage and cough with yellow and throat clearing, rather than stuffiness. It is much less intense than it was.  Hx  of bronchopneumonia in 2004 by CT and CXR, with no f/u since.  Allergy profile had shown IgE 60 with elevations. She thinks she may be interested in skin testing in the future.   01/19/11-  64 yoF remote smoker, referred courtesy of  Dr Haroldine Lawsrossley for allergy evaluation of chronic / recurrent rhinosinusitis. Since last here [place] wetter weather and felt more nasal congestion. Now in last few days she has been off meds for allergy testing, and weather is much cooler. She notices more runny, stuffy nose, throat clearing and sinus pressure. Chest not changed-ok.  Skin test-significant positive reactions for grass, tree, and weed pollens, dust mite, cockroach. She considers winter her worst season. We agreed to have her go back on her current maintenance medicines and see how she does this winter. We did in talk in detail about the possibility of allergy vaccine including realistic descriptions of logistics, risks and benefits.  05/22/11- 64 yoF remote smoker, referred courtesy of Dr Haroldine Lawsrossley for allergy evaluation of chronic / recurrent rhinosinusitis. She is hopeful allergy vaccine is contributing to the improvement she notices. She recognizes also the drier weather is helpful. Has not had a significant respiratory infection this winter. Still taking Allegra. Did have a sinusitis in October treated with Cleocin. Nasal discharge is still occasionally a little purulent with some blood but no pain or fever. She denies headache. Off of all nasal sprays now. Daily cough starts about 6 p.m. Occasional use of saline nasal rinse.  12/03/11- 64 yoF remote smoker, referred courtesy of Dr Haroldine Lawsrossley for allergy evaluation of chronic / recurrent rhinosinusitis Still on vaccine 1:50 GH and doing well; unable to tell any difference if using allegra or Zyrtec; no sinus infection in about 8 months!!!! Still having cough and drainage though-starts late morning and will last about every 2-3 hours-not much  trouble at night. She likes the way she is doing now and doesn't want to "rock the boat".  02/24/13- 67 yoF remote smoker, referred courtesy of Dr Haroldine Laws for allergy evaluation of chronic / recurrent rhinosinusitis FOLLOWS  FOR: still on Allergy vaccine 1:50 GH; can't tell if its helping much, spring and fall are the worst times of year for paitient. Increased drainage and nasal congestion. Rhinitis is perennial, not just in grass pollen season. Allergy profile had been significant for elevated grass pollen IgE. She saw Dr. Haroldine Laws for one sinus infection in the fall. He increased Singulair to 10 mg daily. She disliked Dymista. Nasal sprays are "harsh". We discussed Grastek.   06/21/13- 67 yoF remote smoker, referred courtesy of Dr Haroldine Laws for allergy evaluation of chronic / recurrent rhinosinusitis Follows For: Treated for sinus infection in December with Zpak then Clindamycin -  c/o prod cough (yellow- green) - Chest congestion - Sinus drainage (occas green) - Some SOB on exertion - Denies wheezing, fever, sweat, nodes. Carries kleenex to blow nose. Continue Allergy Vaccine 1:10 GO. Allergy Profile 02/24/13- Total IgE 53.3, positive esp grass and tree pollens  07/17/13- 67 yoF remote smoker, followed forf chronic / recurrent rhinosinusitis, pulmonary infiltrates, emphysema,bronchiectasis, L nodule,  H.flu.  FOLLOWS FOR: patient following up for results of CT and labs per CY. Allergy Vaccine 1:10 GH doing well Sputum cultured H.flu- we sent doxy 07/12/13 Cough and night sweats that began bothering her this winter have markedly improved on doxycycline. Now feels well. Still minor cough at night lying down- minimal Labs reviewed- CBC wnl x platelets 462K. LFT and BMET WNL, Quant TB Neg. CXR and CT reviewed- diffuse inflammatory stranding and scarring. CT chest 06/26/13   ADDENDUM REPORT: 07/03/2013 09:36  ADDENDUM:  The study was reviewed by Dr. Fredirick Lathe, who suggests MAC as the most  likely diagnosis, ILD less likely.  Electronically Signed  By: Oley Balm M.D.  On: 07/03/2013 09:36     CLINICAL DATA: Pulmonary fibrosis. Cough and congestion.  EXAM:  CT CHEST WITH CONTRAST  TECHNIQUE:  Multidetector CT  imaging of the chest was performed during  intravenous contrast administration.  CONTRAST: 80mL OMNIPAQUE IOHEXOL 300 MG/ML SOLN  COMPARISON: 03/09/2003  FINDINGS:  Progressive subpleural blebs in the lung apices, with opacification  of some of the right apical components demonstrating fluid levels.  Progressive pleural-based consolidation in the posterior segment  right upper lobe and extensively throughout the subpleural aspect of  the right middle lobe. There is bronchiectasis in the right lower  lobe with more scattered areas of mostly pleural-based airspace  consolidation in the posterior and lateral basal segments right  lower lobe.  On the left, progression of coarse linear opacities in basilar  segments with some patchy subpleural areas of consolidation. There  is a new nodular masslike area of consolidation measured 16 mm along  the major fissure. Small pleural-based regions of consolidation in  the upper lobe predominately at the apex.  Subcentimeter AP window and precarinal lymph nodes are more  conspicuous. No hilar adenopathy. Low-attenuation liver lesions are  stable or smaller than on previous exam, likely cysts but  incompletely characterized. Remainder visualized upper abdomen  unremarkable. Thoracic spine and sternum intact.  IMPRESSION:  1. Emphysema and bronchiectasis with interval progression of  peripheral nodular consolidation, probably primarily  fibrotic/postinflammatory although fluid levels in right apical  disease suggest infectious component.  2. Follow-up suggested after symptom resolution to confirm  appropriate clearance, exclude neoplasm.  Electronically Signed:  By: Oley Balm M.D.  On: 06/30/2013 16:20   08/08/13- 67 yoF remote smoker, followed forf chronic / recurrent rhinosinusitis, pulmonary infiltrates, emphysema,bronchiectasis, L nodule,  H.flu.  FOLLOWS FOR: still having yellow-green phelgm, cough is different. Everything seems loosen and  deeper. Feels better. Still on Allergy vaccine 1:10 GO Noticing minor nasal stuffiness now with the spring pollen season but feels controlled. Breathing feels better. More stamina during exercise. Cough is looser, still with occasional scant green. Energy is better. Denies fever. Occasional postmenopausal sweat.  Denies chest pain, palpitation, blood, adenopathy, rash. Finishing last round of antibiotics (doxycycline, then Biaxin). Symptoms flared some when she ended doxycycline so she wants to be sure to finish complete antibiotic course. We reviewed labs and imaging. [facility] had been suggested by radiology but AFB cultures negative .She says she has considered, and wants to defer bronchoscopy. She doesn't like the idea of any invasive test in her body. We agreed to repeat chest x-ray after completion of Biaxin.  09/14/13-67 yoF remote smoker, followed for chronic / recurrent rhinosinusitis, pulmonary infiltrates, emphysema,bronchiectasis, L nodule, 1 episode heme, cx'd  H.flu.  She had reported hemoptysis 08/14/13 and we sent Biaxin then augmentin, planning f/u CXR. Pt states breathing has improved since last visit. Pt states cough and SOB have improved. Denies CP.  She now says episode of hemoptysis happened after she choked on some chicken and coughed very hard. Denies any further bleeding. She wants to defer chest x-ray today. Reports 14,000 steps on her FitBit yesterday  01/23/14- 68 yoF remote smoker, followed for chronic / recurrent rhinosinusitis, pulmonary infiltrates, emphysema,bronchiectasis, L nodule, 1 episode heme, cx'd  H.flu. CT sinus 09/30/2004-bilateral maxillary sinusitis CXR 01/22/14 IMPRESSION:  Slight improvement of nodular opacity in the right mid lung with  otherwise unchanged appearance of the lungs as above.  Electronically Signed  By: Sebastian Ache  On: 01/22/2014 14:19  12/21/14- 68 yoF remote smoker, followed for chronic / recurrent rhinosinusitis, pulmonary infiltrates,  emphysema,bronchiectasis, L nodule, 1 episode heme, cx'd  H.flu FOLLOWS GNF:AOZHY on Allergy vaccine and doing well; pt states her breathing is about the same-unsure if she has asthma-noticed slight wheezing-smoke sets off, temp changes. Congestion at night as well. Continues to have SOB and cough.   Husband okay after recent MI with 3 stents. 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 Denies night sweats, fever, purulent sputum, arthralgias, rash or significant sinus discomfort. Still active and goes to gym. Office Spirometry 12/18/2014-very severe obstructive airways disease- FVC 1.61/49%, FEV1 0.70/29%, FEV1/FVC 0.44, FEF 25-75 percent 0.34/16%.  01/18/15-  68 yoF remote smoker, followed for chronic / recurrent rhinosinusitis, pulmonary infiltrates, emphysema,bronchiectasis, L nodule, 1 episode heme, cx'd  H.flu FOLLOWS FOR:  pt is here today to discuss her lab results from her last visit.  pt did not understand these results and has questions for CY.  We reviewed the history of our work together, beginning as a referral for allergy evaluation because of upper airway complaints, evolving in the last few years to more concern for lower respiratory problems. She dates her airways disease to the last 5 or 6 years without specific exposure or event. She brings a book today "A Cure for Asthma" by Luciano Cutter, arguing for treatment for possible low-grade chronic infection as a cause of asthma, specifically chlamydia. She is actively interested in trying  his regimen of 12 weeks treatment with azithromycin. CBC w diff-WBC 6800, 5.2% eosinophils Sed rate-16 ANCA-+1:80, c-ANCA POS ACE-22 CXR 12/21/14- Images reviewed with her IMPRESSION: Severe bullous COPD . Coarse bilateral pulmonary  interstitial prominence and pleural parenchymal thickening noted consistent with chronic interstitial lung disease and pleural parenchymal scarring. No interim change from prior exam. Electronically Signed  By: Maisie Fus Register  On: 12/21/2014 11:56   Review of Systems- See HPI Constitutional:   No weight loss, night sweats,  Fevers, chills, fatigue, lassitude. HEENT: No headaches,  Difficulty swallowing,  Tooth/dental problems,  Sore throat,                No sneezing, itching, ear ache, CV:  No chest pain, orthopnea, PND, swelling in lower extremities, anasarca, dizziness, palpitations GI  No heartburn, indigestion, abdominal pain, nausea, vomiting,  Resp: No shortness of breath with exertion or at rest.  No excess mucus, +minimal productive cough, +Minimal non-productive cough,  No coughing up of blood.  No change in color of mucus. + Minimal wheezing Skin: no rash or lesions. GU: MS:  No joint pain or swelling.  No decreased range of motion.  No back pain. Psych:  No change in mood or affect. No depression or anxiety.  No memory loss.  Objective:   Physical Exam General- Alert, Oriented, Affect-appropriate, Distress- none acute,  + very thin, + looks well Skin- rash-none, lesions- none, excoriation- none Lymphadenopathy- none Head- atraumatic            Eyes- Gross vision intact, PERRLA, conjunctivae clear secretions            Ears- Hearing, canals normal            Nose- clear., no-Septal dev,  polyps, erosion, perforation;   mucus bridging            Throat- Mallampati II , mucosa clear, dry;  drainage- none, tonsils- atrophic.  Neck- flexible , trachea midline, no stridor , thyroid nl, carotid no bruit Chest - symmetrical excursion , unlabored           Heart/CV- RRR , no murmur , no gallop  , no rub, nl s1 s2                           - JVD- none , edema- none, stasis changes- none, varices- none           Lung- +bilateral mild rhonchi upper zones, unlabored, wheeze-  none, cough-none,                                       dullness-none, rub- none           Chest wall-  Abd- Br/ Gen/ Rectal- Not done, not indicated Extrem- cyanosis- none, clubbing, none, atrophy- none, strength- nl Neuro- grossly intact to observation

## 2015-01-18 NOTE — Addendum Note (Signed)
Addended by: Reynaldo Minium C on: 01/18/2015 03:01 PM   Modules accepted: Orders

## 2015-01-18 NOTE — Assessment & Plan Note (Signed)
Allergy profile 08/22/10- IgE 60, specific elevations Allergy skin test 01/19/2011 significant positive reactions particularly for grass weed and tree pollens, dust mite and cockroach. Allergy Profile 02/24/13- Total IgE 53.3, positive esp grass and tree pollens CT sinus 2006 had shown bilateral maxillary sinusitis She has had little specific complaints suggesting sinusitis in recent years but has continued allergy vaccine and has felt that it helped cough and postnasal drip.

## 2015-01-24 ENCOUNTER — Ambulatory Visit: Payer: Medicare Other

## 2015-01-25 ENCOUNTER — Ambulatory Visit (INDEPENDENT_AMBULATORY_CARE_PROVIDER_SITE_OTHER): Payer: Medicare Other

## 2015-01-25 DIAGNOSIS — J309 Allergic rhinitis, unspecified: Secondary | ICD-10-CM

## 2015-01-31 ENCOUNTER — Ambulatory Visit (INDEPENDENT_AMBULATORY_CARE_PROVIDER_SITE_OTHER): Payer: Medicare Other

## 2015-01-31 DIAGNOSIS — J309 Allergic rhinitis, unspecified: Secondary | ICD-10-CM

## 2015-02-07 ENCOUNTER — Ambulatory Visit: Payer: Medicare Other

## 2015-02-08 ENCOUNTER — Ambulatory Visit (INDEPENDENT_AMBULATORY_CARE_PROVIDER_SITE_OTHER): Payer: Medicare Other

## 2015-02-08 DIAGNOSIS — J309 Allergic rhinitis, unspecified: Secondary | ICD-10-CM

## 2015-02-13 ENCOUNTER — Ambulatory Visit (INDEPENDENT_AMBULATORY_CARE_PROVIDER_SITE_OTHER): Payer: Medicare Other

## 2015-02-13 DIAGNOSIS — J309 Allergic rhinitis, unspecified: Secondary | ICD-10-CM

## 2015-02-15 ENCOUNTER — Ambulatory Visit: Payer: Medicare Other

## 2015-02-21 ENCOUNTER — Ambulatory Visit (INDEPENDENT_AMBULATORY_CARE_PROVIDER_SITE_OTHER): Payer: Medicare Other

## 2015-02-21 DIAGNOSIS — J309 Allergic rhinitis, unspecified: Secondary | ICD-10-CM | POA: Diagnosis not present

## 2015-03-01 ENCOUNTER — Ambulatory Visit (INDEPENDENT_AMBULATORY_CARE_PROVIDER_SITE_OTHER): Payer: Medicare Other

## 2015-03-01 DIAGNOSIS — J309 Allergic rhinitis, unspecified: Secondary | ICD-10-CM | POA: Diagnosis not present

## 2015-03-04 ENCOUNTER — Telehealth: Payer: Self-pay | Admitting: Internal Medicine

## 2015-03-04 ENCOUNTER — Encounter: Payer: Self-pay | Admitting: Internal Medicine

## 2015-03-04 ENCOUNTER — Ambulatory Visit (INDEPENDENT_AMBULATORY_CARE_PROVIDER_SITE_OTHER): Payer: Medicare Other

## 2015-03-04 DIAGNOSIS — J309 Allergic rhinitis, unspecified: Secondary | ICD-10-CM | POA: Diagnosis not present

## 2015-03-04 NOTE — Telephone Encounter (Signed)
Allergy Serum Extract Date Mixed: 03/04/15 Vial: 1 Strength: 1:10 Here/Mail/Pick Up: here Mixed By: tbs

## 2015-03-07 ENCOUNTER — Ambulatory Visit: Payer: Medicare Other

## 2015-03-14 ENCOUNTER — Ambulatory Visit: Payer: Medicare Other

## 2015-03-15 ENCOUNTER — Ambulatory Visit (INDEPENDENT_AMBULATORY_CARE_PROVIDER_SITE_OTHER): Payer: Medicare Other

## 2015-03-15 DIAGNOSIS — J309 Allergic rhinitis, unspecified: Secondary | ICD-10-CM | POA: Diagnosis not present

## 2015-03-22 ENCOUNTER — Ambulatory Visit (INDEPENDENT_AMBULATORY_CARE_PROVIDER_SITE_OTHER): Payer: Medicare Other

## 2015-03-22 DIAGNOSIS — J309 Allergic rhinitis, unspecified: Secondary | ICD-10-CM

## 2015-03-28 ENCOUNTER — Ambulatory Visit (INDEPENDENT_AMBULATORY_CARE_PROVIDER_SITE_OTHER): Payer: Medicare Other

## 2015-03-28 DIAGNOSIS — J309 Allergic rhinitis, unspecified: Secondary | ICD-10-CM | POA: Diagnosis not present

## 2015-04-05 ENCOUNTER — Ambulatory Visit (INDEPENDENT_AMBULATORY_CARE_PROVIDER_SITE_OTHER): Payer: Medicare Other

## 2015-04-05 DIAGNOSIS — J309 Allergic rhinitis, unspecified: Secondary | ICD-10-CM

## 2015-04-11 ENCOUNTER — Ambulatory Visit: Payer: Medicare Other

## 2015-04-12 ENCOUNTER — Ambulatory Visit: Payer: Medicare Other

## 2015-04-15 ENCOUNTER — Ambulatory Visit: Payer: Medicare Other

## 2015-04-15 ENCOUNTER — Encounter: Payer: Self-pay | Admitting: Internal Medicine

## 2015-04-16 ENCOUNTER — Ambulatory Visit (INDEPENDENT_AMBULATORY_CARE_PROVIDER_SITE_OTHER): Payer: Medicare Other

## 2015-04-16 DIAGNOSIS — J309 Allergic rhinitis, unspecified: Secondary | ICD-10-CM | POA: Diagnosis not present

## 2015-04-25 ENCOUNTER — Ambulatory Visit (INDEPENDENT_AMBULATORY_CARE_PROVIDER_SITE_OTHER): Payer: Medicare Other

## 2015-04-25 DIAGNOSIS — J309 Allergic rhinitis, unspecified: Secondary | ICD-10-CM

## 2015-05-02 ENCOUNTER — Ambulatory Visit: Payer: Medicare Other | Admitting: Internal Medicine

## 2015-05-02 ENCOUNTER — Ambulatory Visit (INDEPENDENT_AMBULATORY_CARE_PROVIDER_SITE_OTHER): Payer: Medicare Other

## 2015-05-02 DIAGNOSIS — J309 Allergic rhinitis, unspecified: Secondary | ICD-10-CM | POA: Diagnosis not present

## 2015-05-09 ENCOUNTER — Ambulatory Visit: Payer: Medicare Other

## 2015-05-13 ENCOUNTER — Ambulatory Visit (INDEPENDENT_AMBULATORY_CARE_PROVIDER_SITE_OTHER): Payer: Medicare Other

## 2015-05-13 DIAGNOSIS — J309 Allergic rhinitis, unspecified: Secondary | ICD-10-CM | POA: Diagnosis not present

## 2015-05-16 ENCOUNTER — Ambulatory Visit: Payer: Medicare Other

## 2015-05-21 ENCOUNTER — Ambulatory Visit: Payer: Medicare Other

## 2015-05-23 ENCOUNTER — Ambulatory Visit: Payer: Medicare Other

## 2015-05-24 ENCOUNTER — Ambulatory Visit (INDEPENDENT_AMBULATORY_CARE_PROVIDER_SITE_OTHER): Payer: Medicare Other

## 2015-05-24 DIAGNOSIS — J309 Allergic rhinitis, unspecified: Secondary | ICD-10-CM | POA: Diagnosis not present

## 2015-05-27 ENCOUNTER — Ambulatory Visit: Payer: Medicare Other

## 2015-05-31 ENCOUNTER — Ambulatory Visit (INDEPENDENT_AMBULATORY_CARE_PROVIDER_SITE_OTHER): Payer: Medicare Other

## 2015-05-31 DIAGNOSIS — J309 Allergic rhinitis, unspecified: Secondary | ICD-10-CM

## 2015-06-07 ENCOUNTER — Ambulatory Visit (INDEPENDENT_AMBULATORY_CARE_PROVIDER_SITE_OTHER): Payer: Medicare Other

## 2015-06-07 DIAGNOSIS — J309 Allergic rhinitis, unspecified: Secondary | ICD-10-CM | POA: Diagnosis not present

## 2015-06-14 ENCOUNTER — Telehealth: Payer: Self-pay | Admitting: Internal Medicine

## 2015-06-14 ENCOUNTER — Ambulatory Visit (INDEPENDENT_AMBULATORY_CARE_PROVIDER_SITE_OTHER): Payer: Medicare Other

## 2015-06-14 DIAGNOSIS — J309 Allergic rhinitis, unspecified: Secondary | ICD-10-CM | POA: Diagnosis not present

## 2015-06-14 NOTE — Telephone Encounter (Signed)
Spoke with pt. States that Dr. Dudley Major wants her to have the prevnar 13 injection. Wants to have this done with her next allergy injection.  CY - please advise. Thanks.

## 2015-06-14 NOTE — Telephone Encounter (Signed)
LMOMTCB x 1 

## 2015-06-14 NOTE — Telephone Encounter (Signed)
Ok to get Prevnar 13 with next allergy shot

## 2015-06-14 NOTE — Telephone Encounter (Signed)
Spoke with pt, aware of CY's recs.  Note added to pt's next allergy injection note.  Nothing further needed.

## 2015-06-14 NOTE — Telephone Encounter (Signed)
[phone], pt cb

## 2015-06-21 ENCOUNTER — Ambulatory Visit (INDEPENDENT_AMBULATORY_CARE_PROVIDER_SITE_OTHER): Payer: Medicare Other

## 2015-06-21 DIAGNOSIS — Z23 Encounter for immunization: Secondary | ICD-10-CM

## 2015-06-21 DIAGNOSIS — J309 Allergic rhinitis, unspecified: Secondary | ICD-10-CM

## 2015-06-28 ENCOUNTER — Ambulatory Visit (INDEPENDENT_AMBULATORY_CARE_PROVIDER_SITE_OTHER): Payer: Medicare Other | Admitting: *Deleted

## 2015-06-28 DIAGNOSIS — J309 Allergic rhinitis, unspecified: Secondary | ICD-10-CM

## 2015-07-05 ENCOUNTER — Ambulatory Visit (INDEPENDENT_AMBULATORY_CARE_PROVIDER_SITE_OTHER): Payer: Medicare Other | Admitting: *Deleted

## 2015-07-05 DIAGNOSIS — J309 Allergic rhinitis, unspecified: Secondary | ICD-10-CM | POA: Diagnosis not present

## 2015-07-12 ENCOUNTER — Ambulatory Visit (INDEPENDENT_AMBULATORY_CARE_PROVIDER_SITE_OTHER): Payer: Medicare Other | Admitting: *Deleted

## 2015-07-12 DIAGNOSIS — J309 Allergic rhinitis, unspecified: Secondary | ICD-10-CM

## 2015-07-19 ENCOUNTER — Ambulatory Visit (INDEPENDENT_AMBULATORY_CARE_PROVIDER_SITE_OTHER): Payer: Medicare Other | Admitting: *Deleted

## 2015-07-19 DIAGNOSIS — J309 Allergic rhinitis, unspecified: Secondary | ICD-10-CM

## 2015-07-25 ENCOUNTER — Telehealth: Payer: Self-pay | Admitting: Internal Medicine

## 2015-07-25 DIAGNOSIS — J309 Allergic rhinitis, unspecified: Secondary | ICD-10-CM | POA: Diagnosis not present

## 2015-07-25 NOTE — Telephone Encounter (Signed)
Allergy Serum Extract Date Mixed: 07/25/15 Vial: 1 Strength: 1:10 Here/Mail/Pick Up: here  Given By: tbs Last OV: 01/18/15 Pending OV: 10/03/15

## 2015-07-26 ENCOUNTER — Ambulatory Visit (INDEPENDENT_AMBULATORY_CARE_PROVIDER_SITE_OTHER): Payer: Medicare Other | Admitting: *Deleted

## 2015-07-26 DIAGNOSIS — J309 Allergic rhinitis, unspecified: Secondary | ICD-10-CM | POA: Diagnosis not present

## 2015-08-01 ENCOUNTER — Ambulatory Visit: Payer: Medicare Other | Admitting: Internal Medicine

## 2015-08-02 ENCOUNTER — Ambulatory Visit (INDEPENDENT_AMBULATORY_CARE_PROVIDER_SITE_OTHER): Payer: Medicare Other | Admitting: *Deleted

## 2015-08-02 DIAGNOSIS — J309 Allergic rhinitis, unspecified: Secondary | ICD-10-CM | POA: Diagnosis not present

## 2015-08-08 ENCOUNTER — Ambulatory Visit (INDEPENDENT_AMBULATORY_CARE_PROVIDER_SITE_OTHER): Payer: Medicare Other | Admitting: *Deleted

## 2015-08-08 DIAGNOSIS — J309 Allergic rhinitis, unspecified: Secondary | ICD-10-CM

## 2015-08-16 ENCOUNTER — Ambulatory Visit (INDEPENDENT_AMBULATORY_CARE_PROVIDER_SITE_OTHER): Payer: Medicare Other | Admitting: *Deleted

## 2015-08-16 DIAGNOSIS — J309 Allergic rhinitis, unspecified: Secondary | ICD-10-CM | POA: Diagnosis not present

## 2015-08-23 ENCOUNTER — Ambulatory Visit (INDEPENDENT_AMBULATORY_CARE_PROVIDER_SITE_OTHER): Payer: Medicare Other

## 2015-08-23 DIAGNOSIS — J309 Allergic rhinitis, unspecified: Secondary | ICD-10-CM | POA: Diagnosis not present

## 2015-08-30 ENCOUNTER — Ambulatory Visit (INDEPENDENT_AMBULATORY_CARE_PROVIDER_SITE_OTHER): Payer: Medicare Other | Admitting: *Deleted

## 2015-08-30 ENCOUNTER — Ambulatory Visit: Payer: Medicare Other

## 2015-08-30 DIAGNOSIS — J309 Allergic rhinitis, unspecified: Secondary | ICD-10-CM | POA: Diagnosis not present

## 2015-09-06 ENCOUNTER — Ambulatory Visit (INDEPENDENT_AMBULATORY_CARE_PROVIDER_SITE_OTHER): Payer: Medicare Other | Admitting: *Deleted

## 2015-09-06 DIAGNOSIS — J309 Allergic rhinitis, unspecified: Secondary | ICD-10-CM

## 2015-09-12 ENCOUNTER — Encounter: Payer: Self-pay | Admitting: Internal Medicine

## 2015-09-13 ENCOUNTER — Ambulatory Visit (INDEPENDENT_AMBULATORY_CARE_PROVIDER_SITE_OTHER): Payer: Medicare Other | Admitting: *Deleted

## 2015-09-13 DIAGNOSIS — J309 Allergic rhinitis, unspecified: Secondary | ICD-10-CM

## 2015-09-20 ENCOUNTER — Ambulatory Visit (INDEPENDENT_AMBULATORY_CARE_PROVIDER_SITE_OTHER): Payer: Medicare Other

## 2015-09-20 DIAGNOSIS — J309 Allergic rhinitis, unspecified: Secondary | ICD-10-CM

## 2015-09-27 ENCOUNTER — Ambulatory Visit (INDEPENDENT_AMBULATORY_CARE_PROVIDER_SITE_OTHER): Payer: Medicare Other | Admitting: *Deleted

## 2015-09-27 DIAGNOSIS — J309 Allergic rhinitis, unspecified: Secondary | ICD-10-CM | POA: Diagnosis not present

## 2015-10-03 ENCOUNTER — Ambulatory Visit: Payer: Medicare Other | Admitting: Internal Medicine

## 2015-10-04 ENCOUNTER — Ambulatory Visit (INDEPENDENT_AMBULATORY_CARE_PROVIDER_SITE_OTHER): Payer: Medicare Other | Admitting: *Deleted

## 2015-10-04 DIAGNOSIS — J309 Allergic rhinitis, unspecified: Secondary | ICD-10-CM | POA: Diagnosis not present

## 2015-10-11 ENCOUNTER — Ambulatory Visit (INDEPENDENT_AMBULATORY_CARE_PROVIDER_SITE_OTHER): Payer: Medicare Other | Admitting: *Deleted

## 2015-10-11 DIAGNOSIS — J309 Allergic rhinitis, unspecified: Secondary | ICD-10-CM

## 2015-10-21 ENCOUNTER — Ambulatory Visit (INDEPENDENT_AMBULATORY_CARE_PROVIDER_SITE_OTHER): Payer: Medicare Other | Admitting: *Deleted

## 2015-10-21 DIAGNOSIS — J309 Allergic rhinitis, unspecified: Secondary | ICD-10-CM | POA: Diagnosis not present

## 2015-10-30 ENCOUNTER — Ambulatory Visit: Payer: Medicare Other

## 2015-11-05 ENCOUNTER — Ambulatory Visit: Payer: Medicare Other

## 2016-01-28 ENCOUNTER — Ambulatory Visit: Payer: Medicare Other | Admitting: Internal Medicine

## 2016-02-11 ENCOUNTER — Other Ambulatory Visit: Payer: Self-pay | Admitting: *Deleted

## 2016-02-11 MED ORDER — MONTELUKAST SODIUM 10 MG PO TABS: 5.0000 mg | ORAL_TABLET | Freq: Every day | ORAL | 12 refills | Status: DC

## 2016-05-18 ENCOUNTER — Ambulatory Visit: Payer: Medicare Other | Admitting: Internal Medicine

## 2016-07-22 ENCOUNTER — Ambulatory Visit: Payer: Medicare Other | Admitting: Internal Medicine

## 2016-09-14 ENCOUNTER — Ambulatory Visit: Payer: Medicare Other | Admitting: Internal Medicine

## 2016-11-04 ENCOUNTER — Ambulatory Visit: Payer: Medicare Other | Admitting: Internal Medicine

## 2016-12-14 IMAGING — CR DG CHEST 2V
2 series · 2 of 2 positions shown · non-contrast
Comparison: 01/22/2014 .  06/21/2013.

CLINICAL DATA: Bronchiectasis.

EXAM:
CHEST  2 VIEW

[view not recorded (1 of 2)]
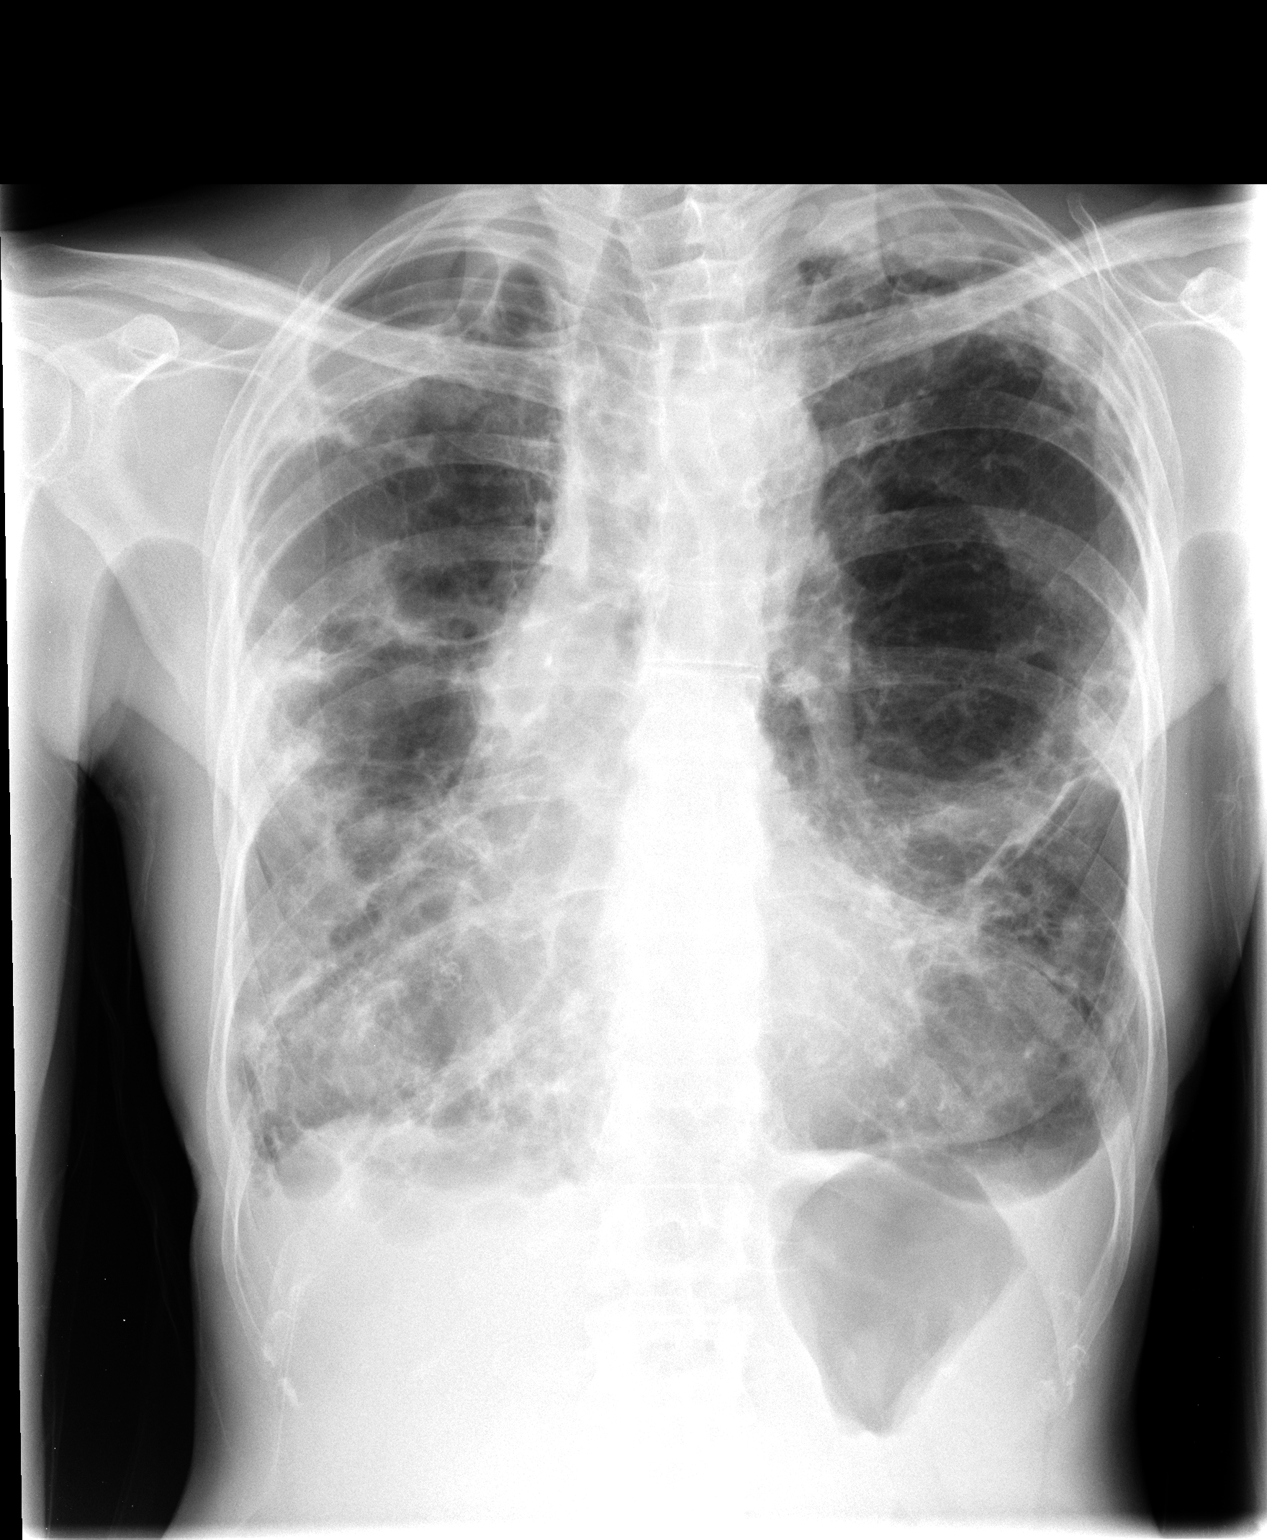

[view not recorded (2 of 2)]
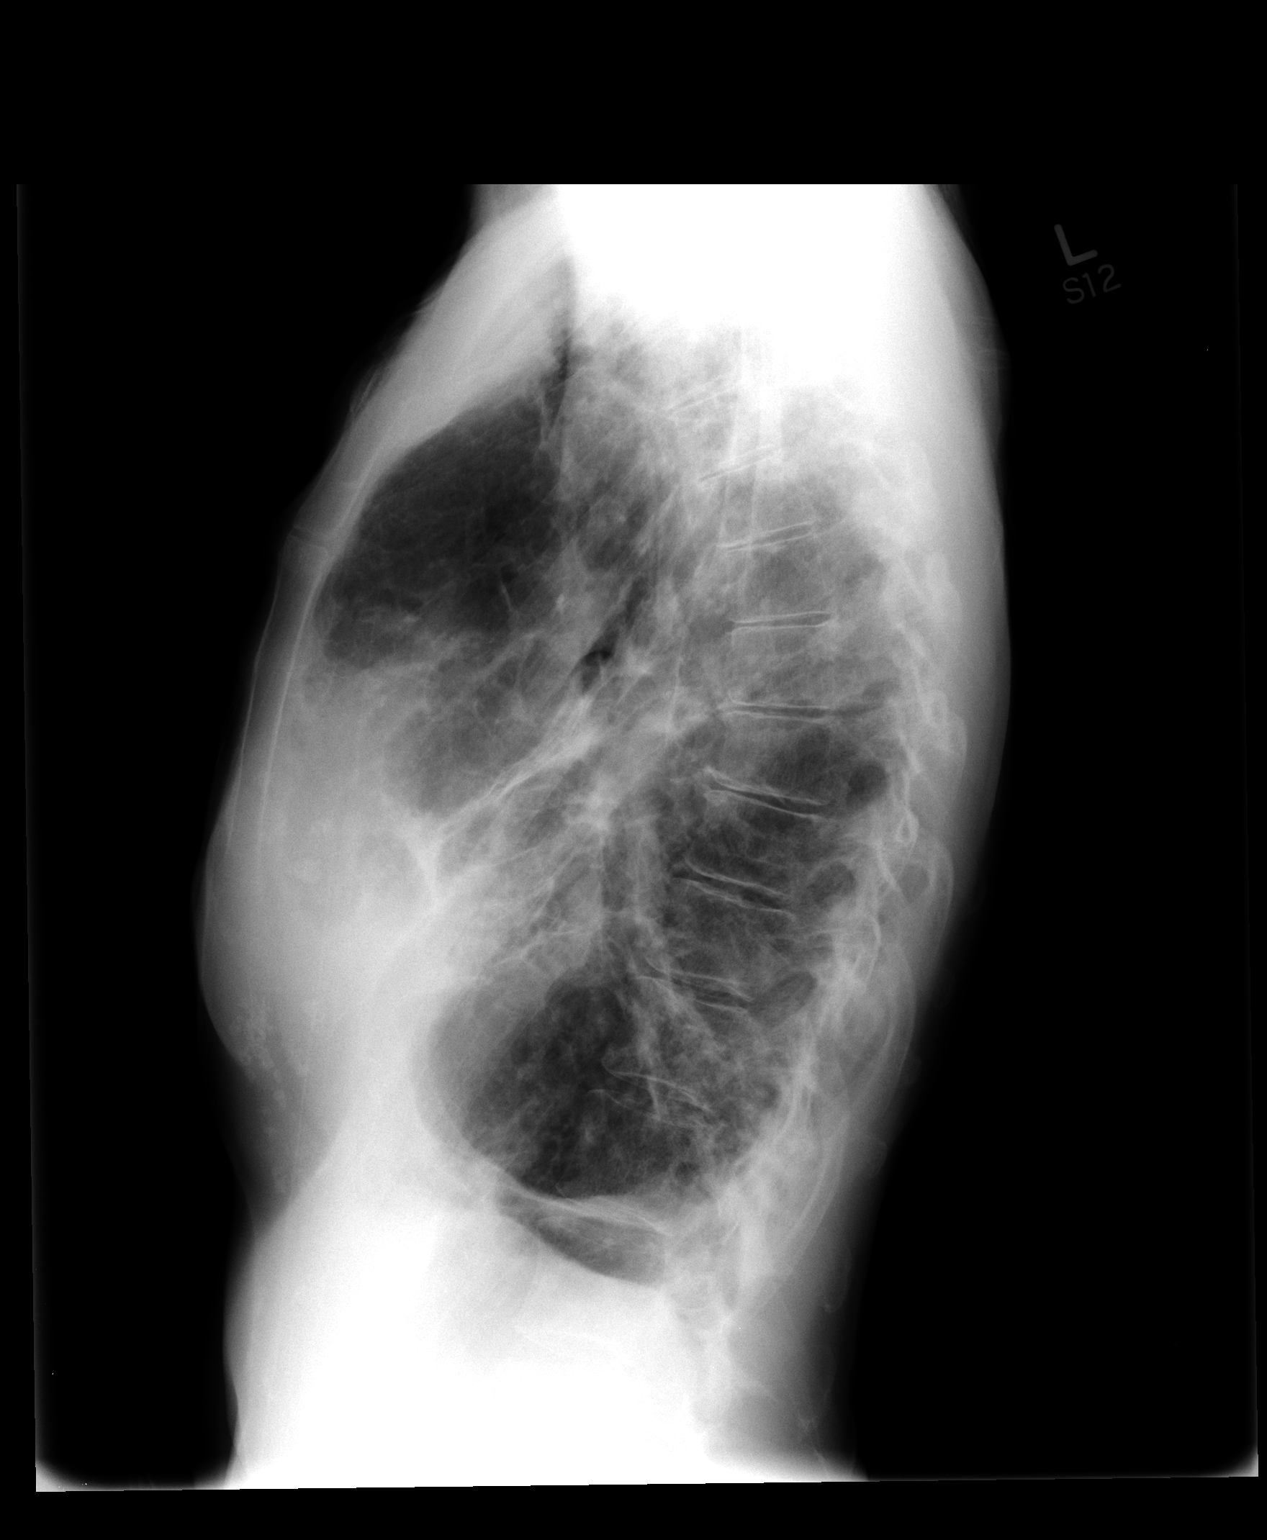

[2 of 2 positions shown; findings below may reference images not displayed]

FINDINGS: Mediastinum hilar structures are normal. Severe bullous COPD.
Diffuse bilateral coarse pulmonary interstitial prominence with
pleural-parenchymal thickening again noted consistent with chronic
interstitial lung disease and pleural parenchymal scarring.
Cardiomegaly. Normal pulmonary vascularity. No acute bony
abnormality.
IMPRESSION: Severe bullous COPD . Coarse bilateral pulmonary interstitial
prominence and pleural parenchymal thickening noted consistent with
chronic interstitial lung disease and pleural parenchymal scarring.
No interim change from prior exam.

## 2016-12-17 ENCOUNTER — Encounter (HOSPITAL_COMMUNITY): Payer: Self-pay

## 2016-12-17 ENCOUNTER — Emergency Department (HOSPITAL_COMMUNITY): Payer: Medicare Other

## 2016-12-17 ENCOUNTER — Emergency Department (HOSPITAL_COMMUNITY)
Admission: EM | Admit: 2016-12-17 | Discharge: 2016-12-18 | Disposition: A | Discharge status: Home / Self Care | Payer: Medicare Other | Attending: Emergency Medicine | Admitting: Emergency Medicine

## 2016-12-17 ENCOUNTER — Other Ambulatory Visit: Payer: Self-pay

## 2016-12-17 DIAGNOSIS — J8410 Pulmonary fibrosis, unspecified: Secondary | ICD-10-CM | POA: Diagnosis not present

## 2016-12-17 DIAGNOSIS — Z79899 Other long term (current) drug therapy: Secondary | ICD-10-CM | POA: Insufficient documentation

## 2016-12-17 DIAGNOSIS — R0602 Shortness of breath: Secondary | ICD-10-CM | POA: Diagnosis present

## 2016-12-17 DIAGNOSIS — J42 Unspecified chronic bronchitis: Secondary | ICD-10-CM | POA: Insufficient documentation

## 2016-12-17 DIAGNOSIS — Z87891 Personal history of nicotine dependence: Secondary | ICD-10-CM | POA: Diagnosis not present

## 2016-12-17 LAB — CBC WITH DIFFERENTIAL/PLATELET
BASOS ABS: 0 10*3/uL (ref 0.0–0.1)
Basophils Relative: 0 %
Eosinophils Absolute: 0 10*3/uL (ref 0.0–0.7)
Eosinophils Relative: 1 %
HEMATOCRIT: 41.9 % (ref 36.0–46.0)
HEMOGLOBIN: 14.2 g/dL (ref 12.0–15.0)
LYMPHS PCT: 13 %
Lymphs Abs: 0.9 10*3/uL (ref 0.7–4.0)
MCH: 31.4 pg (ref 26.0–34.0)
MCHC: 33.9 g/dL (ref 30.0–36.0)
MCV: 92.7 fL (ref 78.0–100.0)
MONO ABS: 0.3 10*3/uL (ref 0.1–1.0)
Monocytes Relative: 4 %
NEUTROS ABS: 6.1 10*3/uL (ref 1.7–7.7)
NEUTROS PCT: 82 %
Platelets: 259 10*3/uL (ref 150–400)
RBC: 4.52 MIL/uL (ref 3.87–5.11)
RDW: 13.5 % (ref 11.5–15.5)
WBC: 7.4 10*3/uL (ref 4.0–10.5)

## 2016-12-17 LAB — BASIC METABOLIC PANEL
ANION GAP: 7 (ref 5–15)
BUN: 15 mg/dL (ref 6–20)
CHLORIDE: 102 mmol/L (ref 101–111)
CO2: 27 mmol/L (ref 22–32)
Calcium: 9.3 mg/dL (ref 8.9–10.3)
Creatinine, Ser: 0.84 mg/dL (ref 0.44–1.00)
GFR calc Af Amer: >60 mL/min (ref >60)
GFR calc non Af Amer: >60 mL/min (ref >60)
GLUCOSE: 156 mg/dL — AB (ref 65–99)
POTASSIUM: 4.4 mmol/L (ref 3.5–5.1)
Sodium: 136 mmol/L (ref 135–145)

## 2016-12-17 LAB — BRAIN NATRIURETIC PEPTIDE: B Natriuretic Peptide: 52 pg/mL (ref 0.0–100.0)

## 2016-12-17 LAB — I-STAT TROPONIN, ED: TROPONIN I, POC: 0 ng/mL (ref 0.00–0.08)

## 2016-12-17 MED ORDER — IPRATROPIUM-ALBUTEROL 0.5-2.5 (3) MG/3ML IN SOLN
3.0000 mL | Freq: Once | RESPIRATORY_TRACT | Status: AC
Start: 2016-12-17 — End: 2016-12-17
  Administered 2016-12-17: 3 mL via RESPIRATORY_TRACT
  Filled 2016-12-17: qty 3

## 2016-12-17 MED ORDER — IOPAMIDOL (ISOVUE-370) INJECTION 76%
INTRAVENOUS | Status: AC
  Administered 2016-12-17: 100 mL via INTRAVENOUS
  Filled 2016-12-17: qty 100

## 2016-12-17 NOTE — ED Triage Notes (Signed)
Onset today shortness of breath states history of autoimmune disorder inflammation lining of lungs and scaring. States SOB worsening overtime especially with exertion.  Denies pain.

## 2016-12-17 NOTE — ED Notes (Signed)
Patient transported to CT 

## 2016-12-17 NOTE — ED Provider Notes (Signed)
[facility]PT Provider Note   CSN: [id number] Arrival date & time: 12/17/16  1809     History   Chief Complaint Chief Complaint  Patient presents with  . Shortness of Breath    HPI Sara Beasley is a 71 y.o. female.  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 She has known large, apical bullae as well as bronchiectasis Patient is chronically treated with Imuran and prednisone. Patient has pulmonologist at [facility], Dr. Jon Billings. Typically the patient is able to take walks for exercise. Typical walk is about 18-25 minutes. He reports just over the past couple of days, her exercise tolerance is decreased to 10 minutes or less. Patient denies any associate chest pain. She denies any lower extremity swelling or calf pain. No fever. Patient for she has mild chronic cough that has not changed. No increase or change in terms of production of mucus or blood. Patient does note she is felt slightly unwell over the past couple of days. The quality of this is mild dizziness and fatigue. Past Medical History:  Diagnosis Date  . ALLERGIC RHINITIS   . Allergy   . Chronic sinusitis   . Lung infection    sees Dr. Maple Hudson- had infection 2015  . Osteopenia     Patient Active Problem List   Diagnosis Date Noted  . 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 Rhinitis, chronic 08/22/2010    Past Surgical History:  Procedure Laterality Date  . BREAST LUMPECTOMY  age 50   non cancerous; Left breast  . COLONOSCOPY    . NASAL SEPTUM SURGERY    . NASAL SINUS SURGERY     with septum surgery  . WISDOM TOOTH EXTRACTION      OB History    No data available       Home Medications    Prior to Admission medications   Medication Sig Start Date End Date Taking? Authorizing Provider  azathioprine (IMURAN) 100 MG tablet Take 100 mg by mouth daily.   Yes [provider]  azithromycin (ZITHROMAX) 250 MG tablet 2 daily x 3 days, then 3 together, 1 day  per week x 12 weeks 01/18/15  Yes Young, Clinton D, MD  calcium carbonate (OS-CAL) 600 MG TABS Take 600 mg by mouth every other day.    Yes [provider]  cholecalciferol (VITAMIN D) 1000 UNITS tablet Take 2,000 Units by mouth daily.    Yes [provider]  mirtazapine (REMERON SOL-TAB) 15 MG disintegrating tablet Take 7.5 mg by mouth at bedtime.  12/04/16  Yes [provider]  montelukast (SINGULAIR) 10 MG tablet Take 0.5 tablets (5 mg total) by mouth at bedtime. 02/11/16  Yes Young, Joni Fears D, MD  Multiple Vitamin (MULTIVITAMIN) tablet Take 1 tablet by mouth every other day.    Yes [provider]  predniSONE (DELTASONE) 10 MG tablet Take 10 mg by mouth daily.  11/30/16  Yes [provider]  predniSONE (DELTASONE) 20 MG tablet 2 tabs po daily x 2 days, then 1 tabs x 3 days, then your usual 10 mg daily. 12/18/16   Arby Barrette, MD  Umeclidinium-Vilanterol (ANORO ELLIPTA) 62.5-25 MCG/INH AEPB Inhale 1 puff into the lungs daily. Patient not taking: Reported on 01/18/2015 12/26/14   Waymon Budge, MD    Family History Family History  Problem Relation Age of Onset  . Heart disease Father   . Lung cancer Maternal Grandmother   . Lung cancer Maternal Grandfather   .  Allergies Grandchild   . Colon cancer Neg Hx   . Esophageal cancer Neg Hx   . Stomach cancer Neg Hx   . Rectal cancer Neg Hx     Social History Social History  Substance Use Topics  . Smoking status: Former Smoker    Packs/day: 1.00    Years: 15.00    Types: Cigarettes    Quit date: 04/27/1974  . Smokeless tobacco: Never Used  . Alcohol use Yes     Comment: socially-wine     Allergies   Clindamycin; Sulfa antibiotics; and Other   Review of Systems Review of Systems 10 Systems reviewed and are negative for acute change except as noted in the HPI.   Physical Exam Updated Vital Signs BP (!) 114/55 Comment: Simultaneous filing. User may not have seen previous data.   Pulse 74   Temp 98.4 F (36.9 C) (Oral)   Resp 19   Ht 5\' 7"  (1.702 m)   Wt 45.8 kg (101 lb)   SpO2 95%   BMI 15.82 kg/m   Physical Exam  Constitutional: She is oriented to person, place, and time.  Patient is thin but well-developed. Nontoxic and alert. No respiratory distress at rest. Mental status clear.  Eyes: EOM are normal.  Cardiovascular: Normal rate, regular rhythm, normal heart sounds and intact distal pulses.   Pulmonary/Chest: Effort normal.  Patient has breath sounds throughout. It does sound as though some breath sounds are transmitted in the upper lung fields. No gross wheezes or rail.  Abdominal: Soft. She exhibits no distension. There is no tenderness. There is no guarding.  Musculoskeletal: Normal range of motion. She exhibits no edema or tenderness.  Neurological: She is alert and oriented to person, place, and time. No cranial nerve deficit. She exhibits normal muscle tone. Coordination normal.  Skin: Skin is warm and dry.  Psychiatric: She has a normal mood and affect.     ED Treatments / Results  Labs (all labs ordered are listed, but only abnormal results are displayed) Labs Reviewed  BASIC METABOLIC PANEL - Abnormal; Notable for the following:       Result Value   Glucose, Bld 156 (*)    All other components within normal limits  CBC WITH DIFFERENTIAL/PLATELET  BRAIN NATRIURETIC PEPTIDE  I-STAT TROPONIN, ED    EKG  EKG Interpretation  Date/Time:  Thursday December 17 2016 18:09:13 EDT Ventricular Rate:  94 PR Interval:  124 QRS Duration: 70 QT Interval:  346 QTC Calculation: 432 R Axis:   -77 Text Interpretation:  Normal sinus rhythm Possible Left atrial enlargement RSR' or QR pattern in V1 suggests right ventricular conduction delay Left anterior fascicular block Abnormal ECG Confirmed by Arby Barrette [id number]) on 12/17/2016 9:31:25 PM       Radiology Dg Chest 2 View  Result Date: 12/17/2016 CLINICAL DATA:  Onset of dyspnea.  Autoimmune  disorder. EXAM: CHEST  2 VIEW COMPARISON:  12/21/2014 FINDINGS: Lungs are hyperinflated with chronic streaky parenchymal change and scarring throughout both lungs, left apical pleural -parenchymal thickening with scarring and enlarged bullous emphysematous changes at the right lung apex. There appears to been some interval increase in size of large right upper lobe bulla. Heart is borderline enlarged. The aorta is ectatic. Chronic blunting of the left costophrenic angle may be due to hyperinflation and pleural thickening versus chronic small left pleural effusion. No acute pneumonic consolidation nor overt pulmonary edema. IMPRESSION: 1. 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 2. Chronic pleural thickening at the left lung apex. Electronically Signed   By: Tollie Eth M.D.   On: 12/17/2016 19:25   Ct Angio Chest Pe W/cm &/or Wo Cm  Result Date: 12/17/2016 CLINICAL DATA:  Shortness of breath. EXAM: CT ANGIOGRAPHY CHEST WITH CONTRAST TECHNIQUE: Multidetector CT imaging of the chest was performed using the standard protocol during bolus administration of intravenous contrast. Multiplanar CT image reconstructions and MIPs were obtained to evaluate the vascular anatomy. CONTRAST:  100 cc Isovue 370 IV COMPARISON:  Radiograph earlier this day.  Chest CT 06/30/2013 FINDINGS: Cardiovascular: There are no filling defects within the pulmonary arteries to the segmental level to suggest pulmonary embolus. Many of the subsegmental branches are diminutive and not well opacified. There is dilatation of the main pulmonary artery of 3.0 cm. Mild right heart dilatation. Coronary artery calcifications. Mild aortic atherosclerosis without aneurysm. Mediastinum/Nodes: Prominent right hilar node measures 10 mm short axis. No mediastinal adenopathy. The esophagus is patulous. Lungs/Pleura: Progressive  chronic lung disease from prior exam with development of large right upper lobe bulla. Multifocal bronchiectasis which has progressed. Biapical pleuroparenchymal scarring. Ill-defined peripheral lung opacities throughout both lungs, likely relating to scarring, however progressed from prior exam. No pleural effusion. No pulmonary edema. Upper Abdomen: No acute abnormality. Musculoskeletal: There are no acute or suspicious osseous abnormalities. Review of the MIP images confirms the above findings. IMPRESSION: 1. No pulmonary embolus. Mild dilatation of the main pulmonary artery suggesting pulmonary arterial hypertension. 2. Advanced chronic lung disease, progressed from 2015 with large bulla in the right upper lobe and progressive bronchiectasis. 3. Multifocal peripheral opacities favored to represent scarring, however progressed from prior exam. An element of acute pneumonia is difficult to exclude. 4. Coronary artery calcifications.  Mild aortic atherosclerosis. Aortic Atherosclerosis (ICD10-I70.0). Electronically Signed   By: Rubye Oaks M.D.   On: 12/17/2016 23:26    Procedures Procedures (including critical care time)  Medications Ordered in ED Medications  predniSONE (DELTASONE) tablet 60 mg (not administered)  albuterol (PROVENTIL HFA;VENTOLIN HFA) 108 (90 Base) MCG/ACT inhaler 2 puff (not administered)  ipratropium-albuterol (DUONEB) 0.5-2.5 (3) MG/3ML nebulizer solution 3 mL (3 mLs Nebulization Given 12/17/16 2219)  iopamidol (ISOVUE-370) 76 % injection (100 mLs Intravenous Contrast Given 12/17/16 2242)     Initial Impression / Assessment and Plan / ED Course  I have reviewed the triage vital signs and the nursing notes.  Pertinent labs & imaging results that were available during my care of the patient were reviewed by me and considered in my medical decision making (see chart for details).      Final Clinical Impressions(s) / ED Diagnoses   Final diagnoses:  Pulmonary fibrosis  (HCC)  Shortness of breath  Patient has known pulmonary fibrosis of unclear etiology.  Symptoms of shortness of breath have worsened over the past couple of days. CT study does not show evidence of pulmonary embolus. Cardiac enzymes and BNP normal. Patient does not have leukocytosis or fever. Patient does have severe and advanced pulmonary disease with large bullae and scarring versus inflammation. Pulmonology has not had definitive diagnosis despite extensive diagnostic evaluation. At this time, patient is stable appearance. Oxygen saturation and blood pressure are stable. Plan will be to try a burst of prednisone with the patient tapering back to her daily 10 mg dose and albuterol inhaler as needed for symptomatic improvement. Patient will contact her pulmonologist for close follow-up.  New Prescriptions New Prescriptions  PREDNISONE (DELTASONE) 20 MG TABLET    2 tabs po daily x 2 days, then 1 tabs x 3 days, then your usual 10 mg daily.     Arby Barrette, MD 12/18/16 [phone]

## 2016-12-18 MED ORDER — PREDNISONE 20 MG PO TABS: 60.0000 mg | ORAL_TABLET | Freq: Once | ORAL | Status: DC

## 2016-12-18 MED ORDER — ALBUTEROL SULFATE HFA 108 (90 BASE) MCG/ACT IN AERS: 2.0000 | INHALATION_SPRAY | RESPIRATORY_TRACT | Status: DC | PRN

## 2016-12-18 MED ORDER — PREDNISONE 20 MG PO TABS: ORAL_TABLET | ORAL | 0 refills | Status: DC

## 2017-01-05 ENCOUNTER — Emergency Department (HOSPITAL_COMMUNITY): Payer: Medicare Other

## 2017-01-05 ENCOUNTER — Observation Stay (HOSPITAL_COMMUNITY)
Admission: EM | Admit: 2017-01-05 | Discharge: 2017-01-06 | Disposition: A | Discharge status: Home / Self Care | Payer: Medicare Other | Attending: Family Medicine | Admitting: Family Medicine

## 2017-01-05 ENCOUNTER — Encounter (HOSPITAL_COMMUNITY): Payer: Self-pay

## 2017-01-05 DIAGNOSIS — I2510 Atherosclerotic heart disease of native coronary artery without angina pectoris: Secondary | ICD-10-CM | POA: Insufficient documentation

## 2017-01-05 DIAGNOSIS — Z681 Body mass index (BMI) 19 or less, adult: Secondary | ICD-10-CM | POA: Diagnosis not present

## 2017-01-05 DIAGNOSIS — Z8249 Family history of ischemic heart disease and other diseases of the circulatory system: Secondary | ICD-10-CM | POA: Insufficient documentation

## 2017-01-05 DIAGNOSIS — I700 Atherosclerosis of aorta: Secondary | ICD-10-CM | POA: Diagnosis not present

## 2017-01-05 DIAGNOSIS — R0603 Acute respiratory distress: Principal | ICD-10-CM | POA: Insufficient documentation

## 2017-01-05 DIAGNOSIS — Z881 Allergy status to other antibiotic agents status: Secondary | ICD-10-CM | POA: Insufficient documentation

## 2017-01-05 DIAGNOSIS — J849 Interstitial pulmonary disease, unspecified: Secondary | ICD-10-CM | POA: Diagnosis present

## 2017-01-05 DIAGNOSIS — Z79899 Other long term (current) drug therapy: Secondary | ICD-10-CM | POA: Insufficient documentation

## 2017-01-05 DIAGNOSIS — Z882 Allergy status to sulfonamides status: Secondary | ICD-10-CM | POA: Insufficient documentation

## 2017-01-05 DIAGNOSIS — D649 Anemia, unspecified: Secondary | ICD-10-CM | POA: Diagnosis present

## 2017-01-05 DIAGNOSIS — Z87891 Personal history of nicotine dependence: Secondary | ICD-10-CM | POA: Diagnosis not present

## 2017-01-05 DIAGNOSIS — N951 Menopausal and female climacteric states: Secondary | ICD-10-CM | POA: Diagnosis not present

## 2017-01-05 DIAGNOSIS — J982 Interstitial emphysema: Secondary | ICD-10-CM | POA: Diagnosis not present

## 2017-01-05 DIAGNOSIS — M8580 Other specified disorders of bone density and structure, unspecified site: Secondary | ICD-10-CM | POA: Insufficient documentation

## 2017-01-05 DIAGNOSIS — J398 Other specified diseases of upper respiratory tract: Secondary | ICD-10-CM | POA: Insufficient documentation

## 2017-01-05 DIAGNOSIS — J479 Bronchiectasis, uncomplicated: Secondary | ICD-10-CM | POA: Insufficient documentation

## 2017-01-05 DIAGNOSIS — E46 Unspecified protein-calorie malnutrition: Secondary | ICD-10-CM | POA: Diagnosis not present

## 2017-01-05 DIAGNOSIS — J9610 Chronic respiratory failure, unspecified whether with hypoxia or hypercapnia: Secondary | ICD-10-CM | POA: Diagnosis not present

## 2017-01-05 DIAGNOSIS — Z7960 Long term (current) use of unspecified immunomodulators and immunosuppressants: Secondary | ICD-10-CM

## 2017-01-05 DIAGNOSIS — Z8709 Personal history of other diseases of the respiratory system: Secondary | ICD-10-CM

## 2017-01-05 NOTE — ED Notes (Signed)
Dr. Delo at bedside. 

## 2017-01-05 NOTE — ED Triage Notes (Signed)
Pt arrived via [facility] from home c/o respiratory distress.  EMS states lung sounds were minimal on arrival to house.  Pt's lung sounds diminished with a left sided expiratory wheeze on arrival.  EMS gave  Albuterol, 0.5mg  Atrovent,  Solumedrol.

## 2017-01-05 NOTE — ED Provider Notes (Signed)
[facility] Provider Note   CSN: [id number] Arrival date & time: 01/05/17  2323     History   Chief Complaint Chief Complaint  Patient presents with  . Respiratory Distress    HPI Sara Beasley is a 71 y.o. female.  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 She is followed by a pulmonologist at [facility]. She presents today for shortness of breath that has worsened today. She denies any chest pain, fevers, chills, or productive cough. She denies any leg swelling or calf pain. She received Solu-Medrol and a breathing treatment in route.   The history is provided by the patient.  Shortness of Breath  This is a recurrent problem. The average episode lasts 1 day. The problem occurs continuously.The problem has been rapidly worsening. Associated symptoms include wheezing. Pertinent negatives include no fever, no cough, no sputum production, no chest pain, no leg pain and no leg swelling.    Past Medical History:  Diagnosis Date  . ALLERGIC RHINITIS   . Allergy   . Chronic sinusitis   . Lung infection    sees Dr. Maple Hudson- had infection 2015  . Osteopenia     Patient Active Problem List   Diagnosis Date Noted  . 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 Rhinitis, chronic 08/22/2010    Past Surgical History:  Procedure Laterality Date  . BREAST LUMPECTOMY  age 66   non cancerous; Left breast  . COLONOSCOPY    . NASAL SEPTUM SURGERY    . NASAL SINUS SURGERY     with septum surgery  . WISDOM TOOTH EXTRACTION      OB History    No data available       Home Medications    Prior to Admission medications   Medication Sig Start Date End Date Taking? Authorizing Provider  azathioprine (IMURAN) 100 MG tablet Take 100 mg by mouth daily.    [provider]  azithromycin (ZITHROMAX) 250 MG tablet 2 daily x 3 days, then 3 together, 1 day per week x 12 weeks 01/18/15   Jetty Duhamel  D, MD  calcium carbonate (OS-CAL) 600 MG TABS Take 600 mg by mouth every other day.     [provider]  cholecalciferol (VITAMIN D) 1000 UNITS tablet Take 2,000 Units by mouth daily.     [provider]  mirtazapine (REMERON SOL-TAB) 15 MG disintegrating tablet Take 7.5 mg by mouth at bedtime.  12/04/16   [provider]  montelukast (SINGULAIR) 10 MG tablet Take 0.5 tablets (5 mg total) by mouth at bedtime. 02/11/16   Jetty Duhamel D, MD  Multiple Vitamin (MULTIVITAMIN) tablet Take 1 tablet by mouth every other day.     [provider]  predniSONE (DELTASONE) 10 MG tablet Take 10 mg by mouth daily.  11/30/16   [provider]  predniSONE (DELTASONE) 20 MG tablet 2 tabs po daily x 2 days, then 1 tabs x 3 days, then your usual 10 mg daily. 12/18/16   Arby Barrette, MD  Umeclidinium-Vilanterol (ANORO ELLIPTA) 62.5-25 MCG/INH AEPB Inhale 1 puff into the lungs daily. Patient not taking: Reported on 01/18/2015 12/26/14   Waymon Budge, MD    Family History Family History  Problem Relation Age of Onset  . Heart disease Father   . Lung cancer Maternal Grandmother   . Lung cancer Maternal Grandfather   . Allergies Grandchild   . Colon cancer Neg  Hx   . Esophageal cancer Neg Hx   . Stomach cancer Neg Hx   . Rectal cancer Neg Hx     Social History Social History  Substance Use Topics  . Smoking status: Former Smoker    Packs/day: 1.00    Years: 15.00    Types: Cigarettes    Quit date: 04/27/1974  . Smokeless tobacco: Never Used  . Alcohol use Yes     Comment: socially-wine     Allergies   Clindamycin; Sulfa antibiotics; and Other   Review of Systems Review of Systems  Constitutional: Negative for fever.  Respiratory: Positive for shortness of breath and wheezing. Negative for cough and sputum production.   Cardiovascular: Negative for chest pain and leg swelling.  All other systems reviewed and are negative.    Physical  Exam Updated Vital Signs BP (!) 145/105 (BP Location: Right Arm)   Pulse 86   Temp 98 F (36.7 C) (Oral)   Resp 18   Ht  (1.702 m)   Wt 45.8 kg (101 lb)   SpO2 100%   BMI 15.82 kg/m   Physical Exam  Constitutional: She is oriented to person, place, and time. She appears well-developed and well-nourished. No distress.  HENT:  Head: Normocephalic and atraumatic.  Neck: Normal range of motion. Neck supple.  Cardiovascular: Normal rate and regular rhythm.  Exam reveals no gallop and no friction rub.   No murmur heard. Pulmonary/Chest: No respiratory distress. She has wheezes.  Patient appears to be in moderate respiratory distress. She has difficulty finishing sentences or speaking more than a few words. She has poor air movement with expiratory wheezing bilaterally. There are no audible rales.  Abdominal: Soft. Bowel sounds are normal. She exhibits no distension. There is no tenderness.  Musculoskeletal: Normal range of motion. She exhibits no edema.  There is no calf pain, swelling, or tenderness. There is no edema. Homans sign is absent bilaterally.  Neurological: She is alert and oriented to person, place, and time.  Skin: Skin is warm and dry. She is not diaphoretic.  Nursing note and vitals reviewed.    ED Treatments / Results  Labs (all labs ordered are listed, but only abnormal results are displayed) Labs Reviewed  COMPREHENSIVE METABOLIC PANEL  CBC WITH DIFFERENTIAL/PLATELET  BRAIN NATRIURETIC PEPTIDE  I-STAT TROPONIN, ED    EKG  EKG Interpretation  Date/Time:  Tuesday January 05 2017 23:23:19 EDT Ventricular Rate:  79 PR Interval:    QRS Duration: 79 QT Interval:  385 QTC Calculation: 442 R Axis:   -8 Text Interpretation:  Sinus rhythm Left atrial enlargement Abnormal R-wave progression, early transition Nonspecific T abnrm, anterolateral leads Confirmed by Geoffery Lyons ([id number]) on 01/05/2017 11:27:48 PM       Radiology No results  found.  Procedures Procedures (including critical care time)  Medications Ordered in ED Medications - No data to display   Initial Impression / Assessment and Plan / ED Course  I have reviewed the triage vital signs and the nursing notes.  Pertinent labs & imaging results that were available during my care of the patient were reviewed by me and considered in my medical decision making (see chart for details).  Patient with history of chronic lung disease as described in the history of present illness. She presents today with shortness of breath. This became worse this evening at home. After receiving steroids and breathing treatments, her symptoms have significantly improved, however she remains acutely dyspneic and becomes hypoxic when attempting  to ambulate. For this reason, I feel as though admission for repeat steroids and breathing treatments is indicated. I've discussed the care with Dr. Katrinka BlazingSmith from the hospitalist service who agrees to admit.  Final Clinical Impressions(s) / ED Diagnoses   Final diagnoses:  None    New Prescriptions New Prescriptions   No medications on file     Geoffery Lyonselo, Eriverto Byrnes, MD 01/06/17 0330

## 2017-01-06 ENCOUNTER — Telehealth: Payer: Self-pay | Admitting: *Deleted

## 2017-01-06 ENCOUNTER — Observation Stay (HOSPITAL_COMMUNITY): Payer: Medicare Other

## 2017-01-06 DIAGNOSIS — J849 Interstitial pulmonary disease, unspecified: Secondary | ICD-10-CM | POA: Diagnosis not present

## 2017-01-06 DIAGNOSIS — J84112 Idiopathic pulmonary fibrosis: Secondary | ICD-10-CM | POA: Insufficient documentation

## 2017-01-06 DIAGNOSIS — Z8709 Personal history of other diseases of the respiratory system: Secondary | ICD-10-CM

## 2017-01-06 DIAGNOSIS — Z79899 Other long term (current) drug therapy: Secondary | ICD-10-CM

## 2017-01-06 DIAGNOSIS — R0603 Acute respiratory distress: Secondary | ICD-10-CM | POA: Diagnosis not present

## 2017-01-06 DIAGNOSIS — D649 Anemia, unspecified: Secondary | ICD-10-CM | POA: Diagnosis not present

## 2017-01-06 DIAGNOSIS — Z7960 Long term (current) use of unspecified immunomodulators and immunosuppressants: Secondary | ICD-10-CM

## 2017-01-06 LAB — CBC
HCT: 41.8 % (ref 36.0–46.0)
Hemoglobin: 13.9 g/dL (ref 12.0–15.0)
MCH: 31.2 pg (ref 26.0–34.0)
MCHC: 33.3 g/dL (ref 30.0–36.0)
MCV: 93.7 fL (ref 78.0–100.0)
PLATELETS: 230 10*3/uL (ref 150–400)
RBC: 4.46 MIL/uL (ref 3.87–5.11)
RDW: 13.9 % (ref 11.5–15.5)
WBC: 8.1 10*3/uL (ref 4.0–10.5)

## 2017-01-06 LAB — PROCALCITONIN

## 2017-01-06 LAB — CBC WITH DIFFERENTIAL/PLATELET
BASOS ABS: 0 10*3/uL (ref 0.0–0.1)
Basophils Relative: 0 %
EOS PCT: 2 %
Eosinophils Absolute: 0.1 10*3/uL (ref 0.0–0.7)
HEMATOCRIT: 36.1 % (ref 36.0–46.0)
Hemoglobin: 11.9 g/dL — ABNORMAL LOW (ref 12.0–15.0)
LYMPHS ABS: 2.2 10*3/uL (ref 0.7–4.0)
LYMPHS PCT: 36 %
MCH: 30.8 pg (ref 26.0–34.0)
MCHC: 33 g/dL (ref 30.0–36.0)
MCV: 93.5 fL (ref 78.0–100.0)
MONO ABS: 0.4 10*3/uL (ref 0.1–1.0)
MONOS PCT: 6 %
NEUTROS ABS: 3.5 10*3/uL (ref 1.7–7.7)
NEUTROS PCT: 56 %
Platelets: 198 10*3/uL (ref 150–400)
RBC: 3.86 MIL/uL — ABNORMAL LOW (ref 3.87–5.11)
RDW: 13.9 % (ref 11.5–15.5)
WBC: 6.2 10*3/uL (ref 4.0–10.5)

## 2017-01-06 LAB — COMPREHENSIVE METABOLIC PANEL
ALBUMIN: 3.4 g/dL — AB (ref 3.5–5.0)
ALT: 12 U/L — AB (ref 14–54)
AST: 28 U/L (ref 15–41)
Alkaline Phosphatase: 26 U/L — ABNORMAL LOW (ref 38–126)
Anion gap: 7 (ref 5–15)
BUN: 13 mg/dL (ref 6–20)
CHLORIDE: 106 mmol/L (ref 101–111)
CO2: 25 mmol/L (ref 22–32)
CREATININE: 0.71 mg/dL (ref 0.44–1.00)
Calcium: 8.1 mg/dL — ABNORMAL LOW (ref 8.9–10.3)
GFR calc non Af Amer: >60 mL/min (ref >60)
GLUCOSE: 109 mg/dL — AB (ref 65–99)
Potassium: 3.9 mmol/L (ref 3.5–5.1)
SODIUM: 138 mmol/L (ref 135–145)
Total Bilirubin: 0.7 mg/dL (ref 0.3–1.2)
Total Protein: 5.9 g/dL — ABNORMAL LOW (ref 6.5–8.1)

## 2017-01-06 LAB — BASIC METABOLIC PANEL
ANION GAP: 9 (ref 5–15)
BUN: 11 mg/dL (ref 6–20)
CALCIUM: 9.1 mg/dL (ref 8.9–10.3)
CO2: 26 mmol/L (ref 22–32)
CREATININE: 0.85 mg/dL (ref 0.44–1.00)
Chloride: 102 mmol/L (ref 101–111)
Glucose, Bld: 176 mg/dL — ABNORMAL HIGH (ref 65–99)
Potassium: 3.9 mmol/L (ref 3.5–5.1)
Sodium: 137 mmol/L (ref 135–145)

## 2017-01-06 LAB — I-STAT TROPONIN, ED: Troponin i, poc: 0.01 ng/mL (ref 0.00–0.08)

## 2017-01-06 LAB — SEDIMENTATION RATE: SED RATE: 15 mm/h (ref 0–22)

## 2017-01-06 LAB — C-REACTIVE PROTEIN: CRP: <0.8 mg/dL (ref <1.0)

## 2017-01-06 LAB — BRAIN NATRIURETIC PEPTIDE: B Natriuretic Peptide: 30.9 pg/mL (ref 0.0–100.0)

## 2017-01-06 MED ORDER — LORATADINE 10 MG PO TABS
10.0000 mg | ORAL_TABLET | Freq: Every day | ORAL | Status: DC
  Filled 2017-01-06: qty 1

## 2017-01-06 MED ORDER — CALCIUM CARBONATE 1250 (500 CA) MG PO TABS
1.0000 | ORAL_TABLET | ORAL | Status: DC
  Filled 2017-01-06: qty 1

## 2017-01-06 MED ORDER — ONDANSETRON HCL 4 MG/2ML IJ SOLN: 4.0000 mg | Freq: Four times a day (QID) | INTRAMUSCULAR | Status: DC | PRN

## 2017-01-06 MED ORDER — METHYLPREDNISOLONE SODIUM SUCC 125 MG IJ SOLR
60.0000 mg | Freq: Three times a day (TID) | INTRAMUSCULAR | Status: DC
  Administered 2017-01-06: 60 mg via INTRAVENOUS
  Filled 2017-01-06: qty 2

## 2017-01-06 MED ORDER — AZITHROMYCIN 250 MG PO TABS
250.0000 mg | ORAL_TABLET | Freq: Every day | ORAL | Status: DC
  Administered 2017-01-06: 250 mg via ORAL
  Filled 2017-01-06: qty 1

## 2017-01-06 MED ORDER — DEXTROSE 5 % IV SOLN
1.0000 g | INTRAVENOUS | Status: DC
  Administered 2017-01-06: 1 g via INTRAVENOUS
  Filled 2017-01-06: qty 10

## 2017-01-06 MED ORDER — ONDANSETRON HCL 4 MG PO TABS: 4.0000 mg | ORAL_TABLET | Freq: Four times a day (QID) | ORAL | Status: DC | PRN

## 2017-01-06 MED ORDER — MONTELUKAST SODIUM 10 MG PO TABS: 5.0000 mg | ORAL_TABLET | Freq: Every day | ORAL | Status: DC

## 2017-01-06 MED ORDER — AMOXICILLIN-POT CLAVULANATE 250-62.5 MG/5ML PO SUSR: 500.0000 mg | Freq: Two times a day (BID) | ORAL | 0 refills | Status: AC

## 2017-01-06 MED ORDER — IOPAMIDOL (ISOVUE-370) INJECTION 76%
INTRAVENOUS | Status: AC
  Administered 2017-01-06: 100 mL
  Filled 2017-01-06: qty 100

## 2017-01-06 MED ORDER — ADULT MULTIVITAMIN W/MINERALS CH
1.0000 | ORAL_TABLET | ORAL | Status: DC
  Administered 2017-01-06: 1 via ORAL
  Filled 2017-01-06: qty 1

## 2017-01-06 MED ORDER — MIRTAZAPINE 15 MG PO TBDP
7.5000 mg | ORAL_TABLET | Freq: Every day | ORAL | Status: DC
  Filled 2017-01-06: qty 0.5

## 2017-01-06 MED ORDER — IPRATROPIUM-ALBUTEROL 0.5-2.5 (3) MG/3ML IN SOLN
3.0000 mL | Freq: Four times a day (QID) | RESPIRATORY_TRACT | Status: DC
  Administered 2017-01-06: 3 mL via RESPIRATORY_TRACT
  Filled 2017-01-06: qty 3

## 2017-01-06 MED ORDER — CALCIUM CARBONATE 600 MG PO TABS: 600.0000 mg | ORAL_TABLET | ORAL | Status: DC

## 2017-01-06 MED ORDER — ENOXAPARIN SODIUM 30 MG/0.3ML ~~LOC~~ SOLN
30.0000 mg | SUBCUTANEOUS | Status: DC
  Filled 2017-01-06: qty 0.3

## 2017-01-06 MED ORDER — IPRATROPIUM-ALBUTEROL 0.5-2.5 (3) MG/3ML IN SOLN: 3.0000 mL | RESPIRATORY_TRACT | 0 refills | Status: DC | PRN

## 2017-01-06 MED ORDER — ACETAMINOPHEN 650 MG RE SUPP: 650.0000 mg | Freq: Four times a day (QID) | RECTAL | Status: DC | PRN

## 2017-01-06 MED ORDER — AZATHIOPRINE 50 MG PO TABS
100.0000 mg | ORAL_TABLET | Freq: Every day | ORAL | Status: DC
  Administered 2017-01-06: 100 mg via ORAL
  Filled 2017-01-06: qty 2

## 2017-01-06 MED ORDER — SODIUM CHLORIDE 0.9% FLUSH
3.0000 mL | Freq: Two times a day (BID) | INTRAVENOUS | Status: DC
  Administered 2017-01-06 (×2): 3 mL via INTRAVENOUS

## 2017-01-06 MED ORDER — PREDNISONE 10 MG PO TABS: ORAL_TABLET | ORAL | Status: DC

## 2017-01-06 MED ORDER — IPRATROPIUM-ALBUTEROL 0.5-2.5 (3) MG/3ML IN SOLN: 3.0000 mL | RESPIRATORY_TRACT | Status: DC | PRN

## 2017-01-06 MED ORDER — VITAMIN D 1000 UNITS PO TABS
2000.0000 [IU] | ORAL_TABLET | Freq: Every day | ORAL | Status: DC
  Filled 2017-01-06: qty 2

## 2017-01-06 MED ORDER — ACETAMINOPHEN 325 MG PO TABS: 650.0000 mg | ORAL_TABLET | Freq: Four times a day (QID) | ORAL | Status: DC | PRN

## 2017-01-06 NOTE — Telephone Encounter (Signed)
error 

## 2017-01-06 NOTE — ED Notes (Signed)
Breakfast at bedside. Set up for patient to eat. Daughter at bedside with patient.

## 2017-01-06 NOTE — ED Notes (Signed)
Walked pt a short distance in hallway on pulse ox, O2 sats went down to 90%, when back on stretcher pt panting shallow rapid breaths.  Dr. Judd Lienelo notified

## 2017-01-06 NOTE — ED Notes (Signed)
Patient transported to CT via wheelchair. Droplet precautions.

## 2017-01-06 NOTE — ED Notes (Signed)
Dr. Waynette ButteryLobo  [place] [phone]

## 2017-01-06 NOTE — ED Notes (Signed)
MD at bedside at this time.

## 2017-01-06 NOTE — Discharge Summary (Signed)
Physician Discharge Summary  Sara Beasley CXK:[id number] DOB: [birth year] DOA: 01/05/2017  PCP: [place] Beasley, [place] Saucer, MD Pulmonologist: Dr. Baldo Beasley at [facility]  Admit date: 01/05/2017 Discharge date: 01/06/2017  Admitted From: Home  Disposition: Home   Recommendations for Outpatient Follow-up:  1. Follow up with PCP in 1 weeks 2. Follow up with Dr. Dudley Beasley this week.   Discharge Condition: STABLE   CODE STATUS: FULL    Brief Hospitalization Summary: Please see all hospital notes, images, labs for full details of the hospitalization.  HPI: [place] Beasley is a 71 y.o. female with medical history significant of interstitial lung disease on chronic immunosuppressant therapy and followed by [place] Beasley at [facility]; who presents with progressively worsening shortness of breath over the last 3 days. Patient notes that she was only walked 12 minutes yesterday normally walks around 20 minutes. She also notes that she is been clearing her throat more /coughing more than normal over this time period as well. She does not like to see the sputum so she never spits it out. She reports feeling tight and congested and last night felt as though she is not able to get in enough air. She denies trying anything to relieve symptoms than her regular home medications. Patient is not on oxygen and currently does not have any inhalers/nebulizers at home to use. She reports taking her medications crushed in applesauce. Patient had had been seen in the emergency department in [place], but was not admitted and had followed up with her primary pulmonologist. It appears her pulmonologist has done an extensive workup in the past as seen on care and wear and patient has also been referred to [facility]. She reports never being previously admitted to the hospital with an acute exacerbation.  ED Course:  En route with EMS patient was given 25 mg of Solu-Medrol in breathing treatments with improvement of symptoms. Patient states  some improvement of symptoms with initial therapies given in the ambulance. Upon admission into the emergency department patient was seen to be afebrile, heart rate 86-97, respirations 18-38, and O2 saturations maintained while at rest on room air. Initial CBC, CMP, BNP, and troponin were relatively unremarkable. Chest x-ray showed bullous emphysema with mild improvement of multifocal lung opacities from prior exam with the right upper lobe bulla unchanged. While in the ED, patient was ambulated, but became significantly short of breath and dyspneic with O2 saturations in the low 90s.  Acute  respiratory distress with suspected flare of emphysema with bronchiectasis/ interstitial lung disease: Acute. Patient present with worsening shortness of breath for last 3 days with increased cough/congestion. Not  on oxygen at baseline but taking on chronic immunosuppressant therapy. Reports intermittent hot flashes questioned possibility of underlying infection. Chest x-ray otherwise looks mildly improved from previous in [place]. - I spoke with her pulmonologist and a CTA chest was obtained, No PE was found, study essentially unchanged from 1 month ago, I spoke with her pulmonologist Dr. Dudley Beasley and he recommended, home nebulizer, meds, oral augmentin suspension for 14 days, close follow up at his office.   Pt ok to discharge home today.  Pt to discuss home oxygen with Dr. Dudley Beasley on outpatient follow up.  Per my conversation, will reduce the dose of steroids.  Pt will take 20 mg daily x 1 week, then resume 10 mg daily.    On chronic immunosuppressant therapy: As seen above  Malnutrition/Weight loss: The patient reports approximately 15 pounds of weight loss over the last 3 years. -  Continue mirtazapine  Normocytic normochromic anemia: Acute. Previously noted to have a hemoglobin of 14.2 in 11/2016 and now 11.9 on admission. - rechecking CBC  DVT prophylaxis: Lovenox Code Status: Full  Family Communication: Plan  of care with the patient, present at bedside Disposition Plan: Home  Consults called: None Admission status: Observation  Discharge Diagnoses:  Principal Problem:   ILD (interstitial lung disease) (HCC) Active Problems:   Long-term use of immunosuppressant medication   Normocytic anemia  Discharge Instructions: Discharge Instructions    Call MD for:  difficulty breathing, headache or visual disturbances    Complete by:  As directed    Call MD for:  extreme fatigue    Complete by:  As directed    Call MD for:  persistant dizziness or light-headedness    Complete by:  As directed    Call MD for:  persistant nausea and vomiting    Complete by:  As directed    Call MD for:  severe uncontrolled pain    Complete by:  As directed    Call MD for:  temperature >100.4    Complete by:  As directed    Diet - low sodium heart healthy    Complete by:  As directed    Increase activity slowly    Complete by:  As directed      Allergies as of 01/06/2017      Reactions   Clindamycin Hives   Sulfa Antibiotics Hives   Other    NO MYCIN DRUGS---per patient on 07-12-13 she can take Zpak no troubles as well as Biaxin      Medication List    STOP taking these medications   azithromycin 250 MG tablet Commonly known as:  ZITHROMAX   umeclidinium-vilanterol 62.5-25 MCG/INH Aepb Commonly known as:  ANORO ELLIPTA     TAKE these medications   amoxicillin-clavulanate 250-62.5 MG/5ML suspension Commonly known as:  AUGMENTIN Take 10 mLs (500 mg total) by mouth 2 (two) times daily.   azathioprine 100 MG tablet Commonly known as:  IMURAN Take 100 mg by mouth daily.   calcium carbonate 600 MG Tabs tablet Commonly known as:  OS-CAL Take 600 mg by mouth every other day.   cetirizine 10 MG tablet Commonly known as:  ZYRTEC Take 5 mg by mouth daily.   cholecalciferol 1000 units tablet Commonly known as:  VITAMIN D Take 2,000 Units by mouth daily.   ipratropium-albuterol 0.5-2.5 (3)  MG/3ML Soln Commonly known as:  DUONEB Take 3 mLs by nebulization every 4 (four) hours as needed (SOB, cough, wheezing).   mirtazapine 15 MG disintegrating tablet Commonly known as:  REMERON SOL-TAB Take 7.5 mg by mouth at bedtime.   montelukast 10 MG tablet Commonly known as:  SINGULAIR Take 0.5 tablets (5 mg total) by mouth at bedtime.   multivitamin tablet Take 1 tablet by mouth every other day.   predniSONE 10 MG tablet Commonly known as:  DELTASONE Take 2 tabs daily for 1 week, then resume taking 1 tab daily What changed:  how much to take  how to take this  when to take this  additional instructions            Durable Medical Equipment        Start     Ordered   01/06/17 1253  For home use only DME Nebulizer/meds  Once    Question:  Patient needs a nebulizer to treat with the following condition  Answer:  Chronic respiratory failure (HCC)  01/06/17 1252       Discharge Care Instructions        Start     Ordered   01/06/17 0000  ipratropium-albuterol (DUONEB) 0.5-2.5 (3) MG/3ML SOLN  Every 4 hours PRN     01/06/17 1258   01/06/17 0000  predniSONE (DELTASONE) 10 MG tablet     01/06/17 1258   01/06/17 0000  amoxicillin-clavulanate (AUGMENTIN) 250-62.5 MG/5ML suspension  2 times daily     01/06/17 1258   01/06/17 0000  Increase activity slowly     01/06/17 1258   01/06/17 0000  Diet - low sodium heart healthy     01/06/17 1258   01/06/17 0000  Call MD for:  temperature >100.4     01/06/17 1258   01/06/17 0000  Call MD for:  persistant nausea and vomiting     01/06/17 1258   01/06/17 0000  Call MD for:  severe uncontrolled pain     01/06/17 1258   01/06/17 0000  Call MD for:  difficulty breathing, headache or visual disturbances     01/06/17 1258   01/06/17 0000  Call MD for:  persistant dizziness or light-headedness     01/06/17 1258   01/06/17 0000  Call MD for:  extreme fatigue     01/06/17 1258     Follow-up Information    Sara RileyMitchell,  Sara Saucerean, MD. Schedule an appointment as soon as possible for a visit in 1 week(s).   Specialty:  Family Medicine Contact information: [address] [place] [address] [phone]        Anson FretLobo, Leonard J, MD. Schedule an appointment as soon as possible for a visit in 1 week(s).   Specialty:  Internal Medicine Why:  Hospital Follow Up  Contact information: [address] [place] [address] [phone]          Allergies  Allergen Reactions  . Clindamycin Hives  . Sulfa Antibiotics Hives  . Other     NO MYCIN DRUGS---per patient on 07-12-13 she can take Zpak no troubles as well as Biaxin   Current Discharge Medication List    START taking these medications   Details  amoxicillin-clavulanate (AUGMENTIN) 250-62.5 MG/5ML suspension Take 10 mLs (500 mg total) by mouth 2 (two) times daily. Qty: 150 mL, Refills: 0    ipratropium-albuterol (DUONEB) 0.5-2.5 (3) MG/3ML SOLN Take 3 mLs by nebulization every 4 (four) hours as needed (SOB, cough, wheezing). Qty: 360 mL, Refills: 0      CONTINUE these medications which have CHANGED   Details  predniSONE (DELTASONE) 10 MG tablet Take 2 tabs daily for 1 week, then resume taking 1 tab daily      CONTINUE these medications which have NOT CHANGED   Details  azathioprine (IMURAN) 100 MG tablet Take 100 mg by mouth daily.    calcium carbonate (OS-CAL) 600 MG TABS Take 600 mg by mouth every other day.     cetirizine (ZYRTEC) 10 MG tablet Take 5 mg by mouth daily.    cholecalciferol (VITAMIN D) 1000 UNITS tablet Take 2,000 Units by mouth daily.     mirtazapine (REMERON SOL-TAB) 15 MG disintegrating tablet Take 7.5 mg by mouth at bedtime.     montelukast (SINGULAIR) 10 MG tablet Take 0.5 tablets (5 mg total) by mouth at bedtime. Qty: 30 tablet, Refills: 12    Multiple Vitamin (MULTIVITAMIN) tablet Take 1 tablet by mouth every other day.       STOP  taking these medications      azithromycin (ZITHROMAX) 250 MG tablet      Umeclidinium-Vilanterol (ANORO ELLIPTA) 62.5-25 MCG/INH AEPB        Procedures/Studies: Dg Chest 2 View  Result Date: 12/17/2016 CLINICAL DATA:  Onset of dyspnea.  Autoimmune disorder. EXAM: CHEST  2 VIEW COMPARISON:  12/21/2014 FINDINGS: Lungs are hyperinflated with chronic streaky parenchymal change and scarring throughout both lungs, left apical pleural -parenchymal thickening with scarring and enlarged bullous emphysematous changes at the right lung apex. There appears to been some interval increase in size of large right upper lobe bulla. Heart is borderline enlarged. The aorta is ectatic. Chronic blunting of the left costophrenic angle may be due to hyperinflation and pleural thickening versus chronic small left pleural effusion. No acute pneumonic consolidation nor overt pulmonary edema. IMPRESSION: 1. 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 2. Chronic pleural thickening at the left lung apex. Electronically Signed   By: Tollie Eth M.D.   On: 12/17/2016 19:25   Ct Angio Chest Pe W Or Wo Contrast  Result Date: 01/06/2017 CLINICAL DATA:  Shortness of breath. EXAM: CT ANGIOGRAPHY CHEST WITH CONTRAST TECHNIQUE: Multidetector CT imaging of the chest was performed using the standard protocol during bolus administration of intravenous contrast. Multiplanar CT image reconstructions and MIPs were obtained to evaluate the vascular anatomy. CONTRAST:  100 mL Isovue 370 nonionic COMPARISON:  Chest CT [place] 23, 2018 and chest radiograph January 05, 2017 ; prior chest CT June 30, 2013 FINDINGS: Cardiovascular: There is no demonstrable pulmonary embolus. The main pulmonary outflow tract measures 3.0 cm, a finding that may be indicative of a degree of underlying pulmonary arterial hypertension, stable from recent prior study. No thoracic  aortic aneurysm or dissection. The visualized great vessels appear unremarkable. There is no appreciable pericardial thickening. There is left ventricular hypertrophy. There is slight coronary artery calcification. Mediastinum/Nodes: No thyroid lesions are evident. There is a right hilar lymph node measuring 1.3 x 1.0 cm, stable. No other lymph node prominence in the thoracic region. No esophageal lesions are evident. 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 Extensive upper lobe bullous disease on the right is stable. Smaller bullae air noted in the left apex. There is apical pleural thickening bilaterally, more notable on the left than on the right. There is extensive fibrosis in the left upper lobe as well as throughout both lower lobes, similar to most recent study. There is traction type bronchiectasis in both lower lobes. There is extensive pleural and parenchymal thickening bilaterally, primarily in the mid to lower lung zones, unchanged. There is no appreciable new opacity to suggest acute pneumonia. There is no appreciable pleural effusion. Upper Abdomen: Visualized upper abdominal structures are unremarkable except for areas of atherosclerotic calcification in the aorta. Musculoskeletal: There are no blastic or lytic bone lesions. Review of the MIP images confirms the above findings. IMPRESSION: 1. No demonstrable pulmonary embolus. Suspect a degree of pulmonary arterial hypertension as noted on recent prior study. 2. Extensive emphysematous type change. Large bullae noted in right upper lobe, stable. Fibrosis with traction bronchiectasis noted, most severe in the lower lobes. 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 3. Multiple areas of chronic patchy consolidation noted. Suspect fibrosis and scarring in these areas. A degree of superimposed pneumonia in these areas cannot be excluded, although stability in these areas  suggests an  etiology other than pneumonia. 4. Single mildly prominent right hilar lymph node, likely of reactive etiology. No new adenopathy. 5. Aortic vascular calcification. Mild coronary artery calcification. 6.  Evidence a degree of tracheomalacia. Aortic Atherosclerosis (ICD10-I70.0) and Emphysema (ICD10-J43.9). Electronically Signed   By: Bretta Bang III M.D.   On: 01/06/2017 12:04   Ct Angio Chest Pe W/cm &/or Wo Cm  Result Date: 12/17/2016 CLINICAL DATA:  Shortness of breath. EXAM: CT ANGIOGRAPHY CHEST WITH CONTRAST TECHNIQUE: Multidetector CT imaging of the chest was performed using the standard protocol during bolus administration of intravenous contrast. Multiplanar CT image reconstructions and MIPs were obtained to evaluate the vascular anatomy. CONTRAST:  100 cc Isovue 370 IV COMPARISON:  Radiograph earlier this day.  Chest CT 06/30/2013 FINDINGS: Cardiovascular: There are no filling defects within the pulmonary arteries to the segmental level to suggest pulmonary embolus. Many of the subsegmental branches are diminutive and not well opacified. There is dilatation of the main pulmonary artery of 3.0 cm. Mild right heart dilatation. Coronary artery calcifications. Mild aortic atherosclerosis without aneurysm. Mediastinum/Nodes: Prominent right hilar node measures 10 mm short axis. No mediastinal adenopathy. The esophagus is patulous. Lungs/Pleura: Progressive chronic lung disease from prior exam with development of large right upper lobe bulla. Multifocal bronchiectasis which has progressed. Biapical pleuroparenchymal scarring. Ill-defined peripheral lung opacities throughout both lungs, likely relating to scarring, however progressed from prior exam. No pleural effusion. No pulmonary edema. Upper Abdomen: No acute abnormality. Musculoskeletal: There are no acute or suspicious osseous abnormalities. Review of the MIP images confirms the above findings. IMPRESSION: 1. No pulmonary embolus. Mild  dilatation of the main pulmonary artery suggesting pulmonary arterial hypertension. 2. Advanced chronic lung disease, progressed from 2015 with large bulla in the right upper lobe and progressive bronchiectasis. 3. Multifocal peripheral opacities favored to represent scarring, however progressed from prior exam. An element of acute pneumonia is difficult to exclude. 4. Coronary artery calcifications.  Mild aortic atherosclerosis. Aortic Atherosclerosis (ICD10-I70.0). Electronically Signed   By: Rubye Oaks M.D.   On: 12/17/2016 23:26   Dg Chest Portable 1 View  Result Date: 01/05/2017 CLINICAL DATA:  Respiratory distress tonight.  Shortness of breath. EXAM: PORTABLE CHEST 1 VIEW COMPARISON:  Radiograph and CT 12/17/2016 FINDINGS: Chronic hyperinflation with advanced bullous emphysema. Largest bulla in the right lung apex. Left apical pleuroparenchymal scarring. Multifocal lung scarring with mild improvement in lower lobe opacities from prior exam. No large pleural effusion. No pneumothorax. Unchanged heart size and mediastinal contours. Bones are under mineralized without acute abnormality. IMPRESSION: Bullous emphysema with mild improvement in multifocal lung opacities from prior exam. Right upper lobe bulla is unchanged. Left apical pleuroparenchymal scarring is unchanged. No new abnormality. Electronically Signed   By: Rubye Oaks M.D.   On: 01/05/2017 23:50      Subjective: Pt still has SOB with exertion but overall improved from admission.   Discharge Exam: Vitals:   01/06/17 0738 01/06/17 1027  BP: 119/76 127/81  Pulse: 78 83  Resp: 16   Temp:    SpO2: 98% 96%   Vitals:   01/06/17 0415 01/06/17 0530 01/06/17 0738 01/06/17 1027  BP: 118/82 127/78 119/76 127/81  Pulse: 89 82 78 83  Resp: 19 (!) 21 16   Temp:      TempSrc:      SpO2: 96% 96% 98% 96%  Weight:      Height:       General: Pt is alert, awake, not in acute distress Cardiovascular: RRR, S1/S2 +,  no rubs, no  gallops Respiratory: CTA bilaterally, no rhonchi, exp wheezing heard Abdominal: Soft, NT, ND, bowel sounds + Extremities: no edema, no cyanosis   The results of significant diagnostics from this hospitalization (including imaging, microbiology, ancillary and laboratory) are listed below for reference.    Microbiology: No results found for this or any previous visit (from the past 240 hour(s)).   Labs: BNP (last 3 results)  Recent Labs  12/17/16 2227 01/05/17 2357  BNP 52.0 30.9   Basic Metabolic Panel:  Recent Labs Lab 01/05/17 2357 01/06/17 0318  NA 138 137  K 3.9 3.9  CL 106 102  CO2 25 26  GLUCOSE 109* 176*  BUN 13 11  CREATININE 0.71 0.85  CALCIUM 8.1* 9.1   Liver Function Tests:  Recent Labs Lab 01/05/17 2357  AST 28  ALT 12*  ALKPHOS 26*  BILITOT 0.7  PROT 5.9*  ALBUMIN 3.4*   No results for input(s): LIPASE, AMYLASE in the last 168 hours. No results for input(s): AMMONIA in the last 168 hours. CBC:  Recent Labs Lab 01/05/17 2357 01/06/17 0318  WBC 6.2 8.1  NEUTROABS 3.5  --   HGB 11.9* 13.9  HCT 36.1 41.8  MCV 93.5 93.7  PLT 198 230   Cardiac Enzymes: No results for input(s): CKTOTAL, CKMB, CKMBINDEX, TROPONINI in the last 168 hours. BNP: Invalid input(s): POCBNP CBG: No results for input(s): GLUCAP in the last 168 hours. D-Dimer No results for input(s): DDIMER in the last 72 hours. Hgb A1c No results for input(s): HGBA1C in the last 72 hours. Lipid Profile No results for input(s): CHOL, HDL, LDLCALC, TRIG, CHOLHDL, LDLDIRECT in the last 72 hours. Thyroid function studies No results for input(s): TSH, T4TOTAL, T3FREE, THYROIDAB in the last 72 hours.  Invalid input(s): FREET3 Anemia work up No results for input(s): VITAMINB12, FOLATE, FERRITIN, TIBC, IRON, RETICCTPCT in the last 72 hours. Urinalysis No results found for: COLORURINE, APPEARANCEUR, LABSPEC, PHURINE, GLUCOSEU, HGBUR, BILIRUBINUR, KETONESUR, PROTEINUR, UROBILINOGEN,  NITRITE, LEUKOCYTESUR Sepsis Labs Invalid input(s): PROCALCITONIN,  WBC,  LACTICIDVEN Microbiology No results found for this or any previous visit (from the past 240 hour(s)).  Time coordinating discharge:   SIGNED:  Standley Dakins, MD  [facility] 01/06/2017, 12:58 PM Pager [phone]  If 7PM-7AM, please contact night-coverage [email or web address] Password [email or web address]

## 2017-01-06 NOTE — H&P (Addendum)
History and Physical    Sara Beasley YQM:[id number] DOB: [birth year] DOA: 01/05/2017  Referring MD/NP/PA: Dr. Stark Jock PCP: Alroy Dust, L.Marlou Sa, MD  Patient coming from: Home via EMS  Chief Complaint: Shortness of breath  HPI: Sara Beasley is a 71 y.o. female with medical history significant of interstitial lung disease on chronic immunosuppressant therapy and followed by Deon Pilling at [facility]; who presents with progressively worsening shortness of breath over the last 3 days. Patient notes that she was only walked 12 minutes yesterday normally walks around 20 minutes. She also notes that she is been clearing her throat more /coughing more than normal over this time period as well. She does not like to see the sputum so she never spits it out. She reports feeling tight and congested and last night felt as though she is not able to get in enough air. She denies trying anything to relieve symptoms than her regular home medications. Patient is not on oxygen and currently does not have any inhalers/nebulizers at home to use. She reports taking her medications crushed in applesauce. Patient had had been seen in the emergency department in August, but was not admitted and had followed up with her primary pulmonologist. It appears her pulmonologist has done an extensive workup in the past as seen on care and wear and patient has also been referred to [facility]. She reports never being previously admitted to the hospital with an acute exacerbation.   ED Course:  En route with EMS patient was given 25 mg of Solu-Medrol in breathing treatments with improvement of symptoms. Patient states some improvement of symptoms with initial therapies given in the ambulance. Upon admission into the emergency department patient was seen to be afebrile, heart rate 86-97, respirations 18-38, and O2 saturations maintained while at rest on room air. Initial CBC, CMP, BNP, and troponin were relatively unremarkable. Chest x-ray  showed bullous emphysema with mild improvement of multifocal lung opacities from prior exam with the right upper lobe bulla unchanged. While in the ED, patient was ambulated, but became significantly short of breath and dyspneic with O2 saturations in the low 90s.  Review of Systems  Constitutional: Positive for weight loss. Negative for chills.       Intermittent hot flashes  HENT: Positive for congestion. Negative for ear pain.   Eyes: Negative for double vision and photophobia.  Respiratory: Positive for cough, sputum production, shortness of breath and wheezing. Negative for hemoptysis.   Cardiovascular: Negative for chest pain and leg swelling.  Gastrointestinal: Negative for constipation, diarrhea, nausea and vomiting.  Genitourinary: Negative for frequency and urgency.  Musculoskeletal: Negative for falls.  Skin: Negative for itching and rash.  Neurological: Negative for sensory change and speech change.  Psychiatric/Behavioral: Negative for hallucinations and substance abuse.    Past Medical History:  Diagnosis Date  . ALLERGIC RHINITIS   . Allergy   . Chronic sinusitis   . Lung infection    sees Dr. Annamaria Boots- had infection 2015  . Osteopenia     Past Surgical History:  Procedure Laterality Date  . BREAST LUMPECTOMY  age 53   non cancerous; Left breast  . COLONOSCOPY    . NASAL SEPTUM SURGERY    . NASAL SINUS SURGERY     with septum surgery  . WISDOM TOOTH EXTRACTION       reports that she quit smoking about 42 years ago. Her smoking use included Cigarettes. She has a 15.00 pack-year smoking history. She has never used smokeless tobacco.  She reports that she drinks alcohol. She reports that she does not use drugs.  Allergies  Allergen Reactions  . Clindamycin Hives  . Sulfa Antibiotics Hives  . Other     NO MYCIN DRUGS---per patient on 07-12-13 she can take Zpak no troubles as well as Biaxin    Family History  Problem Relation Age of Onset  . Heart disease Father    . Lung cancer Maternal Grandmother   . Lung cancer Maternal Grandfather   . Allergies Grandchild   . Colon cancer Neg Hx   . Esophageal cancer Neg Hx   . Stomach cancer Neg Hx   . Rectal cancer Neg Hx     Prior to Admission medications   Medication Sig Start Date End Date Taking? Authorizing Provider  azathioprine (IMURAN) 100 MG tablet Take 100 mg by mouth daily.   Yes [provider]  azithromycin (ZITHROMAX) 250 MG tablet Take 250 mg by mouth daily. CONTINUOUS COURSE 01/02/17  Yes [provider]  calcium carbonate (OS-CAL) 600 MG TABS Take 600 mg by mouth every other day.    Yes [provider]  cetirizine (ZYRTEC) 10 MG tablet Take 5 mg by mouth daily.   Yes [provider]  cholecalciferol (VITAMIN D) 1000 UNITS tablet Take 2,000 Units by mouth daily.    Yes [provider]  mirtazapine (REMERON SOL-TAB) 15 MG disintegrating tablet Take 7.5 mg by mouth at bedtime.  12/04/16  Yes [provider]  montelukast (SINGULAIR) 10 MG tablet Take 0.5 tablets (5 mg total) by mouth at bedtime. 02/11/16  Yes Young, Tarri Fuller D, MD  Multiple Vitamin (MULTIVITAMIN) tablet Take 1 tablet by mouth every other day.    Yes [provider]  predniSONE (DELTASONE) 10 MG tablet Take 10 mg by mouth daily.  11/30/16  Yes [provider]  Umeclidinium-Vilanterol (ANORO ELLIPTA) 62.5-25 MCG/INH AEPB Inhale 1 puff into the lungs daily. Patient not taking: Reported on 01/18/2015 12/26/14   Deneise Lever, MD    Physical Exam:  Constitutional: NAD, calm, comfortable Vitals:   01/06/17 0145 01/06/17 0200 01/06/17 0215 01/06/17 0230  BP: (!) 154/86 139/84 127/79 127/77  Pulse: 91 88 91 87  Resp: (!) 26 (!) 21 (!) 22 (!) 21  Temp:      TempSrc:      SpO2: 94% 94% 96% 96%  Weight:      Height:       Eyes: PERRL, lids and conjunctivae normal ENMT: Mucous membranes are moist. Posterior pharynx clear of any exudate or lesions.Normal  dentition.  Neck: normal, supple, no masses, no thyromegaly Respiratory: clear to auscultation bilaterally, no wheezing, no crackles. Normal respiratory effort. No accessory muscle use.  Cardiovascular: Regular rate and rhythm, no murmurs / rubs / gallops. No extremity edema. 2+ pedal pulses. No carotid bruits.  Abdomen: no tenderness, no masses palpated. No hepatosplenomegaly. Bowel sounds positive.  Musculoskeletal: no clubbing / cyanosis. No joint deformity upper and lower extremities. Good ROM, no contractures. Normal muscle tone.  Skin: no rashes, lesions, ulcers. No induration Neurologic: CN 2-12 grossly intact. Sensation intact, DTR normal. Strength 5/5 in all 4.  Psychiatric: Normal judgment and insight. Alert and oriented x 3. Normal mood.     Labs on Admission: I have personally reviewed following labs and imaging studies  CBC:  Recent Labs Lab 01/05/17 2357  WBC 6.2  NEUTROABS 3.5  HGB 11.9*  HCT 36.1  MCV 93.5  PLT 003   Basic Metabolic Panel:  Recent Labs Lab 01/05/17 2357  NA 138  K 3.9  CL 106  CO2 25  GLUCOSE 109*  BUN 13  CREATININE 0.71  CALCIUM 8.1*   GFR: Estimated Creatinine Clearance: 46 mL/min (by C-G formula based on SCr of 0.71 mg/dL). Liver Function Tests:  Recent Labs Lab 01/05/17 2357  AST 28  ALT 12*  ALKPHOS 26*  BILITOT 0.7  PROT 5.9*  ALBUMIN 3.4*   No results for input(s): LIPASE, AMYLASE in the last 168 hours. No results for input(s): AMMONIA in the last 168 hours. Coagulation Profile: No results for input(s): INR, PROTIME in the last 168 hours. Cardiac Enzymes: No results for input(s): CKTOTAL, CKMB, CKMBINDEX, TROPONINI in the last 168 hours. BNP (last 3 results) No results for input(s): PROBNP in the last 8760 hours. HbA1C: No results for input(s): HGBA1C in the last 72 hours. CBG: No results for input(s): GLUCAP in the last 168 hours. Lipid Profile: No results for input(s): CHOL, HDL, LDLCALC, TRIG, CHOLHDL,  LDLDIRECT in the last 72 hours. Thyroid Function Tests: No results for input(s): TSH, T4TOTAL, FREET4, T3FREE, THYROIDAB in the last 72 hours. Anemia Panel: No results for input(s): VITAMINB12, FOLATE, FERRITIN, TIBC, IRON, RETICCTPCT in the last 72 hours. Urine analysis: No results found for: COLORURINE, APPEARANCEUR, LABSPEC, PHURINE, GLUCOSEU, HGBUR, BILIRUBINUR, KETONESUR, PROTEINUR, UROBILINOGEN, NITRITE, LEUKOCYTESUR Sepsis Labs: No results found for this or any previous visit (from the past 240 hour(s)).   Radiological Exams on Admission: Dg Chest Portable 1 View  Result Date: 01/05/2017 CLINICAL DATA:  Respiratory distress tonight.  Shortness of breath. EXAM: PORTABLE CHEST 1 VIEW COMPARISON:  Radiograph and CT 12/17/2016 FINDINGS: Chronic hyperinflation with advanced bullous emphysema. Largest bulla in the right lung apex. Left apical pleuroparenchymal scarring. Multifocal lung scarring with mild improvement in lower lobe opacities from prior exam. No large pleural effusion. No pneumothorax. Unchanged heart size and mediastinal contours. Bones are under mineralized without acute abnormality. IMPRESSION: Bullous emphysema with mild improvement in multifocal lung opacities from prior exam. Right upper lobe bulla is unchanged. Left apical pleuroparenchymal scarring is unchanged. No new abnormality. Electronically Signed   By: Jeb Levering M.D.   On: 01/05/2017 23:50    EKG: Independently reviewed. Sinus rhythm with LAA  Assessment/Plan Acute  respiratory distress with suspected flare of emphysema with bronchiectasis/ interstitial lung disease: Acute. Patient present with worsening shortness of breath for last 3 days with increased cough/congestion. Not  on oxygen at baseline but taking on chronic immunosuppressant therapy. Reports intermittent hot flashes questioned possibility of underlying infection. Chest x-ray otherwise looks mildly improved from previous in August. - Admit to a  MedSurg bed - Add on CRP, ESR, and Procalcitonin - Check sputum culture, respiratory viral panel - Solu-Medrol 60 mg IV every 8hr - DuoNebs QID and Prn SOB/Wheerzing - Added Rocephin antibiotics given patient was on low-dose azithromycin already - Continue Imuran - Should likely check 6 minute walk prior to discharge to ensure does not need home oxygen - Would recommend contacting Dr. Farley Ly at [facility] in a.m for additional recommendations and consideration.  On chronic immunosuppressant therapy: As seen above  Malnutrition/Weight loss: The patient reports approximately 15 pounds of weight loss over the last 3 years. - Continue mirtazapine  Normocytic normochromic anemia: Acute. Previously noted to have a hemoglobin of 14.2 in 11/2016 and now 11.9 on admission. - rechecking CBC   DVT prophylaxis: Lovenox Code Status: Full  Family Communication: Plan of care with the patient, present at bedside Disposition Plan: Likely  discharge home once medically stable Consults called: None Admission status: Observation  Norval Morton MD [facility] Pager [phone]   If 7PM-7AM, please contact night-coverage [email or web address] Password [email or web address]  01/06/2017, 2:51 AM

## 2017-01-06 NOTE — ED Notes (Signed)
Patient ambulated to restroom. Daughter supervised. Gait slow, steady. No distress noted.

## 2017-01-06 NOTE — ED Notes (Signed)
Patient ambulated to restroom unassisted. Gait slow, steady.  

## 2017-01-06 NOTE — Care Management (Signed)
Patient left prior to delivery of the neb machine. CM contacted patient by phone, she states, she will have someone come back to pick it up from the ED.  CM made patient aware that. Neb machine will be left in the ED CM office.

## 2017-01-06 NOTE — Discharge Instructions (Signed)
Follow with Primary MD  Clovis Riley, L.August Saucer, MD  and other consultant's as instructed your Hospitalist MD  Please get a complete blood count and chemistry panel checked by your Primary MD at your next visit, and again as instructed by your Primary MD.  Get Medicines reviewed and adjusted: Please take all your medications with you for your next visit with your Primary MD  Laboratory/radiological data: Please request your Primary MD to go over all hospital tests and procedure/radiological results at the follow up, please ask your Primary MD to get all Hospital records sent to his/her office.  In some cases, they will be blood work, cultures and biopsy results pending at the time of your discharge. Please request that your primary care M.D. follows up on these results.  Also Note the following: If you experience worsening of your admission symptoms, develop shortness of breath, life threatening emergency, suicidal or homicidal thoughts you must seek medical attention immediately by calling 911 or calling your MD immediately  if symptoms less severe.  You must read complete instructions/literature along with all the possible adverse reactions/side effects for all the Medicines you take and that have been prescribed to you. Take any new Medicines after you have completely understood and accpet all the possible adverse reactions/side effects.   Do not drive when taking Pain medications or sleeping medications (Benzodaizepines)  Do not take more than prescribed Pain, Sleep and Anxiety Medications. It is not advisable to combine anxiety,sleep and pain medications without talking with your primary care practitioner  Special Instructions: If you have smoked or chewed Tobacco  in the last 2 yrs please stop smoking, stop any regular Alcohol  and or any Recreational drug use.  Wear Seat belts while driving.  Please note: You were cared for by a hospitalist during your hospital stay. Once you are  discharged, your primary care physician will handle any further medical issues. Please note that NO REFILLS for any discharge medications will be authorized once you are discharged, as it is imperative that you return to your primary care physician (or establish a relationship with a primary care physician if you do not have one) for your post hospital discharge needs so that they can reassess your need for medications and monitor your lab values.     Chronic Respiratory Failure Respiratory failure is a condition in which the lungs do not work well and the breathing (respiratory) system fails. When respiratory failure occurs, it becomes difficult for the lungs to get enough oxygen or to eliminate carbon dioxide or to do both duties. If the lungs do not work properly, the heart, brain, and other body systems do not get enough oxygen. Respiratory failure is life-threatening if it is not treated. Respiratory failure can be acute or chronic. Acute respiratory failure is sudden and severe and requires emergency medical treatment. Chronic respiratory failure happens over time, usually due to a medical condition that gets worse. What are the causes? This condition may be caused by any problem that affects the heart or lungs. Causes include:  Chronic bronchitis and emphysema (COPD).  Pulmonary fibrosis.  Water in the lungs due to heart failure, lung injury, or infection (pulmonary edema).  Asthma.  Nerve or muscle diseases that make chest movements difficult, such as Hilda Blades disease or Guillain-Barre syndrome.  A collapsed lung (pneumothorax).  Pulmonary hypertension.  Chronic sleep apnea.  Pneumonia.  Obesity.  A blood clot in a lung (pulmonary embolism).  Trauma to the chest that makes breathing difficult.  What increases the risk? You are more likely to develop this condition if:  You are a smoker, or have a history of smoking.  You have a weak immune system.  You have a  family history of breathing problems or lung disease.  You have a long term lung disease such as COPD.  What are the signs or symptoms? Symptoms of this condition include:  Shortness of breath with or without activity.  Difficulty breathing.  Wheezing.  A fast or irregular heartbeat (arrhythmia).  Chest pain or tightness.  A bluish color to the fingernail or toenail beds (cyanosis).  Confusion.  Drowsiness.  Extreme fatigue, especially with minimal activity.  How is this diagnosed? This condition may be diagnosed based on:  Your medical history.  A physical exam.  Other tests, such as: ? A chest X-ray. ? A CT scan of your lungs. ? Blood tests, such as an arterial blood gas test. This test is done to check if you have enough oxygen in your blood. ? An electrocardiogram. This test records the electrical activity of your heart. ? An echocardiogram. This test uses sound waves to produce an image of your heart.  A check of your blood pressure, heart rate, breathing rate, and blood oxygen level.  How is this treated? Treatment for this condition depends on the cause. Treatment can include the following:  Getting oxygen through a nasal cannula. This is a tube that goes in your nose.  Getting oxygen through a face mask.  Receiving noninvasive positive pressure ventilation. This is a method of breathing support in which a machine blows air into your lungs through a mask. The machine allows you to breathe on your own. It helps the body take in oxygen and eliminate carbon dioxide.  Using a ventilator. This is a breathing machine that delivers oxygen to the lungs through a breathing tube that is put into the trachea. This machine is used when you can no longer breathe well enough on your own.  Medicines to help with breathing, such as: ? Medicines that open up and relax air passages, such as bronchodilators. These may be given through a device that turns liquid medicines into  a mist you can breathe in (nebulizer). These medicines help with breathing. ? Diuretics. These medicines get rid of extra fluid out of your lungs, which can help you breathe better. ? Steroid medicines. These decrease inflammation in the lungs. ? Antibiotic medicines. These may be given to treat a bacterial infection, such as pneumonia.  Pulmonary rehabilitation. This is an exercise program that strengthens the muscles in your chest and helps you learn breathing techniques in order to manage your condition.  Follow these instructions at home: Medicines  Take over-the-counter and prescription medicines only as told by your health care provider.  If you were prescribed an antibiotic medicine, take it as told by your health care provider. Do not stop taking the antibiotic even if you start to feel better. General instructions  Use oxygen therapy and pulmonary rehabilitation if directed to by your health care provider. If you require home oxygen therapy, ask your health care provider whether you should purchase a pulse oximeter to measure your oxygen level at home.  Work with your health care provider to create a plan to help you deal with your condition. Follow this plan.  Do not use any products that contain nicotine or tobacco, such as cigarettes and e-cigarettes. If you need help quitting, ask your health care provider.  Avoid exposure  to irritants that make your breathing problems worse. These include smoke, chemicals, and fumes.  Stay active, but balance activity with periods of rest. Exercise and physical activity will help you maintain your ability to do things you want to do.  Stay up to date on all vaccines, especially yearly influenza and pneumonia vaccines.  Avoid people who are sick as well as crowded places during the flu season.  Keep all follow-up visits as told by your health care provider. This is important. Contact a health care provider if:  Your shortness of breath  gets worse and you cannot do the things you used to do.  You have increased mucus (sputum), wheezing, coughing, or loss of energy.  You are on oxygen therapy and you are starting to need more.  You need to use your medicines more often.  You have a fever. Get help right away if:  Your shortness of breath becomes worse.  You are unable to say more than a few words without having to catch your breath.  You develop chest pain or tightness. Summary  Respiratory failure is a condition in which the lungs do not work well and the breathing system fails.  This condition can be very serious and is often life-threatening.  This condition is diagnosed with tests and can be treated with medicines or oxygen.  Contact a health care provider if your shortness of breath gets worse or if you need to use your oxygen or medicines more often than before. This information is not intended to replace advice given to you by your health care provider. Make sure you discuss any questions you have with your health care provider. Document Released: 04/13/2005 Document Revised: 04/24/2016 Document Reviewed: 04/24/2016 Elsevier Interactive Patient Education  2017 [employer]

## 2017-01-06 NOTE — Care Management (Signed)
    Durable Medical Equipment        Start     Ordered   01/06/17 1635  For home use only DME Nebulizer machine  Once    Question:  Patient needs a nebulizer to treat with the following condition  Answer:  COPD exacerbation (HCC)   01/06/17 1636   01/06/17 1253  For home use only DME Nebulizer/meds  Once    Question:  Patient needs a nebulizer to treat with the following condition  Answer:  Chronic respiratory failure (HCC)   01/06/17 1252     ED CM received call from Dr. Laural BenesJohnson concerning patient being discharged home with need for home neb machine, [facility] being [facility]. Pending insurance approval.

## 2017-01-06 NOTE — Progress Notes (Signed)
01/05/2017 11:23 PM  01/06/2017 10:16 AM  Tatisha Claude Manges Sehgal was seen and examined.  The H&P by the admitting provider, orders, imaging was reviewed.  Family spoke with pulmonologist Dr. Baldo AshJason Lobo who is recommending CT chest to rule out PE which I agree with and am ordering STAT.  Please see new orders.  Will continue to follow.   Maryln Manuel. Johnson, MD [facility]

## 2017-01-06 NOTE — Progress Notes (Signed)
01/06/2017 1:40 PM  I spoke with patient's pulmonologist Dr. Dudley MajorLobo and reviewed CT results with him, he is recommending home nebulizer, oral augmentin x 14 days and he will see in office in the week and he felt patient stable for discharge today.  He has been caring for her for many years.  I updated patient and husband at the bedside.  Sara Beasley. [name] MD

## 2017-01-11 LAB — CULTURE, BLOOD (ROUTINE X 2)
Culture: NO GROWTH
Special Requests: ADEQUATE

## 2017-01-21 ENCOUNTER — Ambulatory Visit: Payer: Medicare Other | Admitting: Internal Medicine

## 2017-03-24 ENCOUNTER — Telehealth: Payer: Self-pay | Admitting: Internal Medicine

## 2017-03-24 NOTE — Telephone Encounter (Signed)
Called [facility] and spoke with Chales AbrahamsMary Ann who took a verbal order of pt's refill of Singulair, 10mg  tab take 0.5tab at bedtime, #30 with 12-RF.  Nothing further needed.

## 2017-04-01 ENCOUNTER — Ambulatory Visit: Payer: Medicare Other | Admitting: Internal Medicine

## 2017-06-15 ENCOUNTER — Telehealth (HOSPITAL_COMMUNITY): Payer: Self-pay

## 2017-06-15 NOTE — Telephone Encounter (Signed)
Patients insurance is active and benefits verified through Medicare A/B - 20% co-insurance.  Patients insurance is active and benefits verified through [facility] supplement - Covers what medicare does not. [facility] - reference # [id number]  Patient is covered at 100%

## 2017-06-16 ENCOUNTER — Telehealth (HOSPITAL_COMMUNITY): Payer: Self-pay

## 2017-06-16 NOTE — Telephone Encounter (Signed)
Attempted to call patient in regards to Pulmonary Rehab - lm on vm °

## 2017-06-18 ENCOUNTER — Telehealth (HOSPITAL_COMMUNITY): Payer: Self-pay

## 2017-06-18 NOTE — Telephone Encounter (Signed)
Patient returned phone call and is interested in the 10:30am exc class. I explained to patient that our class is full and once a spot becomes available we will call to schedule. Patient understands.

## 2017-06-29 ENCOUNTER — Telehealth (HOSPITAL_COMMUNITY): Payer: Self-pay

## 2017-06-29 NOTE — Telephone Encounter (Signed)
Called patient in regards to Pulmonary rehab - scheduled orientation on 07/26/2017 at 1:30pm. Patient will attend the 1:30pm exc class. Went over insurance with patient and patient stated she understands. Mailed packet.

## 2017-07-21 ENCOUNTER — Ambulatory Visit: Payer: Medicare Other | Admitting: Internal Medicine

## 2017-07-26 ENCOUNTER — Encounter (HOSPITAL_COMMUNITY)
Admission: RE | Admit: 2017-07-26 | Discharge: 2017-07-26 | Disposition: A | Discharge status: Home / Self Care | Payer: Medicare Other | Source: Ambulatory Visit | Attending: Pulmonary Disease | Admitting: Pulmonary Disease

## 2017-07-26 ENCOUNTER — Encounter (HOSPITAL_COMMUNITY): Payer: Self-pay

## 2017-07-26 VITALS — BP 172/92 | HR 69 | Resp 22 | Ht 67.0 in | Wt 99.4 lb

## 2017-07-26 DIAGNOSIS — J849 Interstitial pulmonary disease, unspecified: Secondary | ICD-10-CM | POA: Insufficient documentation

## 2017-07-26 DIAGNOSIS — Z87891 Personal history of nicotine dependence: Secondary | ICD-10-CM | POA: Insufficient documentation

## 2017-07-26 DIAGNOSIS — Z79899 Other long term (current) drug therapy: Secondary | ICD-10-CM | POA: Diagnosis not present

## 2017-07-26 NOTE — Progress Notes (Signed)
Sara AlbrightBurwell T Dobias 72 y.o. female Pulmonary Rehab Orientation Note Patient arrived today in Cardiac and Pulmonary Rehab for orientation to Pulmonary Rehab. [facility] [employer] accompanied by her husband who is also a patient of the cardiac maintenance program. She has not been prescribed oxygen for home or portable use. Color good, skin warm and dry. Patient is oriented to time and place. Patient's medical history, psychosocial health, and medications reviewed. Psychosocial assessment reveals pt lives with their spouse. Pt is currently employed as a Tree surgeonpart-time psychologist. Pt hobbies include hiking, biking, and being extremely active outdoors however she no longer is able to do those things. She now states she reads and watches TV. She admits to grieving the loss of the "person I was". Pt reports her stress level is moderate. Areas of stress/anxiety include Health.  Pt exhibits signs of depression. Signs of depression include anxiety, crying spells and sadness and fatigue. PHQ2/9 score 1/4 although I suspect she underestimates her scores. She is currently under the care of a psychiatrist. She was offered emotional support and reassurance during this visit. Will continue to monitor and evaluate progress toward psychosocial goal(s) of acceptance of her current "situation" and progression towards finding her "new self". Physical assessment reveals heart rate is normal. Scattered expiratory wheezes heard throughout with rhonchi. Grip strength equal, strong. Distal pulses palpable. Patient reports she does take medications as prescribed. Patient states she follows a high caloric diet. The patient has been trying to gain weight by increasing her calories and eating small frequent meals.. Patient's weight will be monitored closely. Demonstration and practice of PLB using pulse oximeter. Patient able to return demonstration satisfactorily. Safety and hand hygiene in the exercise area reviewed with patient.  Patient voices understanding of the information reviewed. Department expectations discussed with patient and achievable goals were set. The patient shows enthusiasm about attending the program and we look forward to working with this nice lady. The patient is scheduled for a 6 min walk test on 07/29/17 and to begin exercise on 08/05/17 at 1330.   45 minutes was spent on a variety of activities such as assessment of the patient, obtaining baseline data including height, weight, BMI, and grip strength, verifying medical history, allergies, and current medications, and teaching patient strategies for performing tasks with less respiratory effort with emphasis on pursed lip breathing.

## 2017-07-28 ENCOUNTER — Encounter (HOSPITAL_COMMUNITY): Payer: Self-pay | Admitting: *Deleted

## 2017-07-29 ENCOUNTER — Ambulatory Visit (HOSPITAL_COMMUNITY): Payer: Medicare Other

## 2017-07-29 ENCOUNTER — Encounter (HOSPITAL_COMMUNITY)
Admission: RE | Admit: 2017-07-29 | Discharge: 2017-07-29 | Disposition: A | Discharge status: Home / Self Care | Payer: Medicare Other | Source: Ambulatory Visit | Attending: Pulmonary Disease | Admitting: Pulmonary Disease

## 2017-07-29 DIAGNOSIS — J849 Interstitial pulmonary disease, unspecified: Secondary | ICD-10-CM

## 2017-07-30 NOTE — Progress Notes (Signed)
Sara AlbrightBurwell T Beasley 72 y.o. female  DOB: 09/08/1945 MRN: [id number]           Nutrition Brief Note 1. Interstitial pulmonary disease, unspecified (HCC)    Past Medical History:  Diagnosis Date  . ALLERGIC RHINITIS   . Allergy   . Chronic sinusitis   . Lung infection    sees Dr. Maple HudsonYoung- had infection 2015  . Osteopenia    Meds reviewed. Remeron, Delatsone, Os-cal, vitamin D, MVI noted  Ht: Ht Readings from Last 1 Encounters:  07/26/17 5\' 7"  (1.702 m)    Wt:  Wt Readings from Last 3 Encounters:  07/26/17 99 lb 6.4 oz (45.1 kg)  01/05/17 101 lb (45.8 kg)  12/17/16 101 lb (45.8 kg)    BMI: 15.6    Current tobacco use? No  Labs:  Lipid Panel  No results found for: CHOL, TRIG, HDL, CHOLHDL, VLDL, LDLCALC, LDLDIRECT  No results found for: HGBA1C  Nutrition Diagnosis ? Food-and nutrition-related knowledge deficit related to lack of exposure to information as related to diagnosis of pulmonary disease ? Increased energy expenditure related to increased energy requirements as evidenced by BMI <20.  Goal(s) 1. The pt will recognize symptoms that can interfere with adequate oral intake, such as shortness of breath, N/V, early satiety, fatigue, ability to secure and prepare food, taste and smell changes, chewing/swallowing difficulties, and/ or pain when eating. 2. The pt will consume high-energy, high-nutrient dense beverages when necessary to compensate for decreased oral intake of solid foods. 3. Identify food quantities necessary to achieve wt gain of  -2# per week to a goal wt gain of 2.7-10.9 kg (6-24 lb) at graduation from pulmonary rehab.  Plan:  Pt to attend Pulmonary Nutrition class Will provide client-centered nutrition education as part of interdisciplinary care.   Monitor and evaluate progress toward nutrition goal with team.  Monitor and Evaluate progress toward nutrition goal with team.   Mickle PlumbEdna Janaki Exley, M.Ed, RD, LDN, CDE 07/30/2017 2:07 PM

## 2017-07-30 NOTE — Progress Notes (Signed)
Pulmonary Individual Treatment Plan  Patient Details  Name: Sara Beasley MRN: [id number] Date of Birth: 09-01-1945 Referring Provider:     Pulmonary Rehab Walk Test from 07/29/2017 in [facility]  Referring Provider  Dr. Molli Knock      Initial Encounter Date:    Pulmonary Rehab Walk Test from 07/29/2017 in [facility]ARD[facility]  Date  07/29/17  Referring Provider  Dr. Molli Knock      Visit Diagnosis: Interstitial pulmonary disease, unspecified (HCC)  Patient's Home Medications on Admission:   Current Outpatient Medications:  .  ALPRAZolam (XANAX) 0.5 MG tablet, Take by mouth 3 (three) times daily as needed. Take 1/2 tab up to TID PRN, Disp: , Rfl:  .  azathioprine (IMURAN) 100 MG tablet, Take 100 mg by mouth daily., Disp: , Rfl:  .  calcium carbonate (OS-CAL) 600 MG TABS, Take 600 mg by mouth every other day. , Disp: , Rfl:  .  cetirizine (ZYRTEC) 10 MG tablet, Take 5 mg by mouth daily., Disp: , Rfl:  .  cholecalciferol (VITAMIN D) 1000 UNITS tablet, Take 2,000 Units by mouth daily. , Disp: , Rfl:  .  escitalopram (LEXAPRO) 10 MG tablet, 10 mg daily. , Disp: , Rfl:  .  ipratropium-albuterol (DUONEB) 0.5-2.5 (3) MG/3ML SOLN, Take 3 mLs by nebulization every 4 (four) hours as needed (SOB, cough, wheezing). (Patient not taking: Reported on 07/26/2017), Disp: 360 mL, Rfl: 0 .  mirtazapine (REMERON SOL-TAB) 15 MG disintegrating tablet, Take 7.5 mg by mouth at bedtime. , Disp: , Rfl:  .  montelukast (SINGULAIR) 10 MG tablet, Take 0.5 tablets (5 mg total) by mouth at bedtime., Disp: 30 tablet, Rfl: 12 .  Multiple Vitamin (MULTIVITAMIN) tablet, Take 1 tablet by mouth every other day. , Disp: , Rfl:  .  predniSONE (DELTASONE) 10 MG tablet, Take 2 tabs daily for 1 week, then resume taking 1 tab daily, Disp: , Rfl:   Past Medical History: Past Medical History:  Diagnosis Date  . ALLERGIC RHINITIS   . Allergy   . Chronic sinusitis   . Lung  infection    sees Dr. Maple Hudson- had infection 2015  . Osteopenia     Tobacco Use: Social History   Tobacco Use  Smoking Status Former Smoker  . Packs/day: 1.00  . Years: 15.00  . Pack years: 15.00  . Types: Cigarettes  . Last attempt to quit: 04/27/1974  . Years since quitting: 43.2  Smokeless Tobacco Never Used    Labs: Recent Review Flowsheet Data    There is no flowsheet data to display.      Capillary Blood Glucose: No results found for: GLUCAP   Pulmonary Assessment Scores: Pulmonary Assessment Scores    Row Name 07/28/17 1038 07/30/17 0704       ADL UCSD   ADL Phase  Entry  Entry    SOB Score total  56  -      CAT Score   CAT Score  20 Entry  -      mMRC Score   mMRC Score  -  1       Pulmonary Function Assessment:   Exercise Target Goals: Date: 07/29/17  Exercise Program Goal: Individual exercise prescription set using results from initial 6 min walk test and THRR while considering  patient's activity barriers and safety.    Exercise Prescription Goal: Initial exercise prescription builds to 30-45 minutes a day of aerobic activity, 2-3 days per week.  Home  exercise guidelines will be given to patient during program as part of exercise prescription that the participant will acknowledge.  Activity Barriers & Risk Stratification: Activity Barriers & Cardiac Risk Stratification - 07/26/17 1441      Activity Barriers & Cardiac Risk Stratification   Activity Barriers  Deconditioning;Shortness of Breath;Muscular Weakness       6 Minute Walk: 6 Minute Walk    Row Name 07/30/17 0704         6 Minute Walk   Phase  Initial     Distance  1123 feet     Walk Time  6 minutes     # of Rest Breaks  0     MPH  2.12     METS  2.61     RPE  13     Perceived Dyspnea   3     Symptoms  No     Resting HR  80 bpm     Resting BP  160/90     Resting Oxygen Saturation   94 %     Exercise Oxygen Saturation  during 6 min walk  87 %     Max Ex. HR  107 bpm      Max Ex. BP  178/90     2 Minute Post BP  150/100       Interval HR   1 Minute HR  92     2 Minute HR  93     3 Minute HR  97     4 Minute HR  97     5 Minute HR  100     6 Minute HR  107     2 Minute Post HR  85     Interval Heart Rate?  Yes       Interval Oxygen   Interval Oxygen?  Yes     Baseline Oxygen Saturation %  94 %     1 Minute Oxygen Saturation %  94 %     1 Minute Liters of Oxygen  0 L     2 Minute Oxygen Saturation %  91 %     2 Minute Liters of Oxygen  0 L     3 Minute Oxygen Saturation %  88 %     3 Minute Liters of Oxygen  0 L     4 Minute Oxygen Saturation %  88 %     4 Minute Liters of Oxygen  0 L     5 Minute Oxygen Saturation %  87 %     5 Minute Liters of Oxygen  0 L     6 Minute Oxygen Saturation %  94 %     6 Minute Liters of Oxygen  0 L     2 Minute Post Oxygen Saturation %  92 %     2 Minute Post Liters of Oxygen  0 L        Oxygen Initial Assessment: Oxygen Initial Assessment - 07/30/17 0703      Initial 6 min Walk   Oxygen Used  None      Program Oxygen Prescription   Program Oxygen Prescription  None patient desaturated to 87% during 6MWT will re-evaluate        Oxygen Re-Evaluation:   Oxygen Discharge (Final Oxygen Re-Evaluation):   Initial Exercise Prescription: Initial Exercise Prescription - 07/30/17 0700      Date of Initial Exercise RX and Referring Provider   Date  07/29/17  Referring Provider  Dr. Molli Knock      Bike   Level  1 scitfit    Watts  10    Minutes  17      NuStep   Level  1    SPM  80    Minutes  17    METs  1.5      Track   Laps  8    Minutes  17      Prescription Details   Frequency (times per week)  2    Duration  Progress to 45 minutes of aerobic exercise without signs/symptoms of physical distress      Intensity   THRR 40-80% of Max Heartrate  59-118    Ratings of Perceived Exertion  11-13    Perceived Dyspnea  0-4      Progression   Progression  Continue progressive overload as  per policy without signs/symptoms or physical distress.      Resistance Training   Training Prescription  Yes    Weight  blue bands    Reps  10-15       Perform Capillary Blood Glucose checks as needed.  Exercise Prescription Changes:   Exercise Comments:   Exercise Goals and Review: Exercise Goals    Row Name 07/26/17 1441             Exercise Goals   Increase Physical Activity  Yes       Expected Outcomes  Short Term: Attend rehab on a regular basis to increase amount of physical activity.;Long Term: Add in home exercise to make exercise part of routine and to increase amount of physical activity.;Long Term: Exercising regularly at least 3-5 days a week.       Increase Strength and Stamina  Yes       Intervention  Provide advice, education, support and counseling about physical activity/exercise needs.;Develop an individualized exercise prescription for aerobic and resistive training based on initial evaluation findings, risk stratification, comorbidities and participant's personal goals.       Expected Outcomes  Short Term: Increase workloads from initial exercise prescription for resistance, speed, and METs.;Short Term: Perform resistance training exercises routinely during rehab and add in resistance training at home;Long Term: Improve cardiorespiratory fitness, muscular endurance and strength as measured by increased METs and functional capacity ( )       Able to understand and use rate of perceived exertion (RPE) scale  Yes       Intervention  Provide education and explanation on how to use RPE scale       Expected Outcomes  Short Term: Able to use RPE daily in rehab to express subjective intensity level;Long Term:  Able to use RPE to guide intensity level when exercising independently       Able to understand and use Dyspnea scale  Yes       Intervention  Provide education and explanation on how to use Dyspnea scale       Expected Outcomes  Short Term: Able to use  Dyspnea scale daily in rehab to express subjective sense of shortness of breath during exertion;Long Term: Able to use Dyspnea scale to guide intensity level when exercising independently       Knowledge and understanding of Target Heart Rate Range (THRR)  Yes       Intervention  Provide education and explanation of THRR including how the numbers were predicted and where they are located for reference       Expected Outcomes  Short Term: Able to state/look up THRR;Short Term: Able to use daily as guideline for intensity in rehab;Long Term: Able to use THRR to govern intensity when exercising independently       Understanding of Exercise Prescription  Yes       Intervention  Provide education, explanation, and written materials on patient's individual exercise prescription       Expected Outcomes  Short Term: Able to explain program exercise prescription;Long Term: Able to explain home exercise prescription to exercise independently          Exercise Goals Re-Evaluation :   Discharge Exercise Prescription (Final Exercise Prescription Changes):   Nutrition:  Target Goals: Understanding of nutrition guidelines, daily intake of sodium 1500mg , cholesterol 200mg , calories 30% from fat and 7% or less from saturated fats, daily to have 5 or more servings of fruits and vegetables.  Biometrics: Pre Biometrics - 07/26/17 1531      Pre Biometrics   Grip Strength  26 kg        Nutrition Therapy Plan and Nutrition Goals:   Nutrition Assessments:   Nutrition Goals Re-Evaluation:   Nutrition Goals Discharge (Final Nutrition Goals Re-Evaluation):   Psychosocial: Target Goals: Acknowledge presence or absence of significant depression and/or stress, maximize coping skills, provide positive support system. Participant is able to verbalize types and ability to use techniques and skills needed for reducing stress and depression.  Initial Review & Psychosocial Screening: Initial Psych Review &  Screening - 07/26/17 1548      Family Dynamics   Comments  patient states she is grieving the recent loss of "who I was". because of her declining pulmonary health she is no longer able to maintaining an active lifestyle such as biking and hiking and she is being treated for depression.      Screening Interventions   Interventions  Encouraged to exercise;To provide support and resources with identified psychosocial needs;Provide feedback about the scores to participant    Expected Outcomes  Short Term goal: Utilizing psychosocial counselor, staff and physician to assist with identification of specific Stressors or current issues interfering with healing process. Setting desired goal for each stressor or current issue identified.;Long Term Goal: Stressors or current issues are controlled or eliminated.;Short Term goal: Identification and review with participant of any Quality of Life or Depression concerns found by scoring the questionnaire.;Long Term goal: The participant improves quality of Life and PHQ9 Scores as seen by post scores and/or verbalization of changes       Quality of Life Scores:  Scores of 19 and below usually indicate a poorer quality of life in these areas.  A difference of  2-3 points is a clinically meaningful difference.  A difference of 2-3 points in the total score of the Quality of Life Index has been associated with significant improvement in overall quality of life, self-image, physical symptoms, and general health in studies assessing change in quality of life.   PHQ-9: Recent Review Flowsheet Data    Depression screen [facility] 2/9 07/26/2017   Decreased Interest 0   Down, Depressed, Hopeless 1   PHQ - 2 Score 1   Altered sleeping 0   Tired, decreased energy 3   Change in appetite 0   Feeling bad or failure about yourself  0   Trouble concentrating 0   Moving slowly or fidgety/restless 0   Suicidal thoughts 0   PHQ-9 Score 4   Difficult doing work/chores Not  difficult at all     Interpretation of  Total Score  Total Score Depression Severity:  1-4 = Minimal depression, 5-9 = Mild depression, 10-14 = Moderate depression, 15-19 = Moderately severe depression, 20-27 = Severe depression   Psychosocial Evaluation and Intervention: Psychosocial Evaluation - 07/26/17 1553      Psychosocial Evaluation & Interventions   Interventions  Encouraged to exercise with the program and follow exercise prescription patient currently seeing psychiatrist    Comments  patient has been started on lexapro and has a follow up with her psychiatrist on Thursday 08/05/16    Continue Psychosocial Services   Follow up required by staff       Psychosocial Re-Evaluation:   Psychosocial Discharge (Final Psychosocial Re-Evaluation):   Education: Education Goals: Education classes will be provided on a weekly basis, covering required topics. Participant will state understanding/return demonstration of topics presented.  Learning Barriers/Preferences: Learning Barriers/Preferences - 07/26/17 1513      Learning Barriers/Preferences   Learning Barriers  None    Learning Preferences  Individual Instruction;Skilled Demonstration;Verbal Instruction;Written Material;Computer/Internet       Education Topics: Risk Factor Reduction:  -Group instruction that is supported by a PowerPoint presentation. Instructor discusses the definition of a risk factor, different risk factors for pulmonary disease, and how the heart and lungs work together.     Nutrition for Pulmonary Patient:  -Group instruction provided by PowerPoint slides, verbal discussion, and written materials to support subject matter. The instructor gives an explanation and review of healthy diet recommendations, which includes a discussion on weight management, recommendations for fruit and vegetable consumption, as well as protein, fluid, caffeine, fiber, sodium, sugar, and alcohol. Tips for eating when patients  are short of breath are discussed.   Pursed Lip Breathing:  -Group instruction that is supported by demonstration and informational handouts. Instructor discusses the benefits of pursed lip and diaphragmatic breathing and detailed demonstration on how to preform both.     Oxygen Safety:  -Group instruction provided by PowerPoint, verbal discussion, and written material to support subject matter. There is an overview of "What is Oxygen" and "Why do we need it".  Instructor also reviews how to create a safe environment for oxygen use, the importance of using oxygen as prescribed, and the risks of noncompliance. There is a brief discussion on traveling with oxygen and resources the patient may utilize.   Oxygen Equipment:  -Group instruction provided by [facility] Staff utilizing handouts, written materials, and equipment demonstrations.   Signs and Symptoms:  -Group instruction provided by written material and verbal discussion to support subject matter. Warning signs and symptoms of infection, stroke, and heart attack are reviewed and when to call the physician/911 reinforced. Tips for preventing the spread of infection discussed.   Advanced Directives:  -Group instruction provided by verbal instruction and written material to support subject matter. Instructor reviews Advanced Directive laws and proper instruction for filling out document.   Pulmonary Video:  -Group video education that reviews the importance of medication and oxygen compliance, exercise, good nutrition, pulmonary hygiene, and pursed lip and diaphragmatic breathing for the pulmonary patient.   Exercise for the Pulmonary Patient:  -Group instruction that is supported by a PowerPoint presentation. Instructor discusses benefits of exercise, core components of exercise, frequency, duration, and intensity of an exercise routine, importance of utilizing pulse oximetry during exercise, safety while exercising, and options of  places to exercise outside of rehab.     Pulmonary Medications:  -Verbally interactive group education provided by instructor with focus on inhaled medications and proper administration.  Anatomy and Physiology of the Respiratory System and Intimacy:  -Group instruction provided by PowerPoint, verbal discussion, and written material to support subject matter. Instructor reviews respiratory cycle and anatomical components of the respiratory system and their functions. Instructor also reviews differences in obstructive and restrictive respiratory diseases with examples of each. Intimacy, Sex, and Sexuality differences are reviewed with a discussion on how relationships can change when diagnosed with pulmonary disease. Common sexual concerns are reviewed.   MD DAY -A group question and answer session with a medical doctor that allows participants to ask questions that relate to their pulmonary disease state.   OTHER EDUCATION -Group or individual verbal, written, or video instructions that support the educational goals of the pulmonary rehab program.   Holiday Eating Survival Tips:  -Group instruction provided by PowerPoint slides, verbal discussion, and written materials to support subject matter. The instructor gives patients tips, tricks, and techniques to help them not only survive but enjoy the holidays despite the onslaught of food that accompanies the holidays.   Knowledge Questionnaire Score: Knowledge Questionnaire Score - 07/26/17 1513      Knowledge Questionnaire Score   Pre Score  13/18       Core Components/Risk Factors/Patient Goals at Admission: Personal Goals and Risk Factors at Admission - 07/26/17 1514      Core Components/Risk Factors/Patient Goals on Admission    Weight Management  Weight Gain    Intervention  Weight Management: Develop a combined nutrition and exercise program designed to reach desired caloric intake, while maintaining appropriate intake of  nutrient and fiber, sodium and fats, and appropriate energy expenditure required for the weight goal.;Weight Management: Provide education and appropriate resources to help participant work on and attain dietary goals.    Expected Outcomes  Short Term: Continue to assess and modify interventions until short term weight is achieved;Weight Gain: Understanding of general recommendations for a high calorie, high protein meal plan that promotes weight gain by distributing calorie intake throughout the day with the consumption for 4-5 meals, snacks, and/or supplements;Understanding of distribution of calorie intake throughout the day with the consumption of 4-5 meals/snacks;Understanding recommendations for meals to include 15-35% energy as protein, 25-35% energy from fat, 35-60% energy from carbohydrates, less than 200mg  of dietary cholesterol, 20-35 gm of total fiber daily;Long Term: Adherence to nutrition and physical activity/exercise program aimed toward attainment of established weight goal    Improve shortness of breath with ADL's  Yes    Intervention  Provide education, individualized exercise plan and daily activity instruction to help decrease symptoms of SOB with activities of daily living.    Expected Outcomes  Short Term: Improve cardiorespiratory fitness to achieve a reduction of symptoms when performing ADLs;Long Term: Be able to perform more ADLs without symptoms or delay the onset of symptoms       Core Components/Risk Factors/Patient Goals Review:    Core Components/Risk Factors/Patient Goals at Discharge (Final Review):    ITP Comments:   Comments:

## 2017-08-03 ENCOUNTER — Encounter (HOSPITAL_COMMUNITY): Payer: Medicare Other

## 2017-08-05 ENCOUNTER — Encounter (HOSPITAL_COMMUNITY): Payer: Medicare Other

## 2017-08-10 ENCOUNTER — Encounter (HOSPITAL_COMMUNITY)
Admission: RE | Admit: 2017-08-10 | Discharge: 2017-08-10 | Disposition: A | Discharge status: Home / Self Care | Payer: Medicare Other | Source: Ambulatory Visit | Attending: Pulmonary Disease | Admitting: Pulmonary Disease

## 2017-08-10 VITALS — Wt 98.5 lb

## 2017-08-10 DIAGNOSIS — J849 Interstitial pulmonary disease, unspecified: Secondary | ICD-10-CM | POA: Diagnosis not present

## 2017-08-10 NOTE — Progress Notes (Signed)
Sara Beasley 72 y.o. female  DOB: 06/17/1945 MRN: [id number]           Nutrition Note Dx: Pulmonary Interstitial Disease  Meds reviewed. Remeron, Delatsone, Os-cal, vitamin D, MVI noted  Ht: Ht Readings from Last 1 Encounters:  07/26/17 5\' 7"  (1.702 m)    Wt:  Wt Readings from Last 7 Encounters:  07/26/17 99 lb 6.4 oz (45.1 kg)  01/05/17 101 lb (45.8 kg)  12/17/16 101 lb (45.8 kg)  01/18/15 100 lb 6 oz (45.5 kg)  12/21/14 101 lb 6.4 oz (46 kg)  05/03/14 106 lb 6.4 oz (48.3 kg)  01/23/14 109 lb (49.4 kg)    BMI: 15.6    Current tobacco use? No  Labs:  Lipid Panel  No results found for: CHOL, TRIG, HDL, CHOLHDL, VLDL, LDLCALC, LDLDIRECT  No results found for: HGBA1C Note Spoke with pt and pt's husband individually. Pt is underweight. Pt wt h/o ~ 17 lb wt loss (14.5% decrease) over the past 6 years. Pt wt has been 98-101 lb over the past 3 years.  Pt eats 3 meals a day.  Per discussion, pt states she has been working on gaining wt over the past 6 months. Pt feels she has a good appetite. Remeron noted. However, pt taking 1/2 the dose ordered because the "full dose makes we dizzy when I wake up in the middle of the night." Pt's husband feels pt is in denial re: disease process and amount of food she's eating. Pt's husband feels pt not eating much due to swallowing difficulties. Pt is seeing an SLP. Pt had a G-tube placement scheduled ~1 month ago, which pt has cancelled.  Pt educated re: High Calorie, High Protein diet. Pt expressed understanding of the information reviewed via feedback method.    Nutrition Diagnosis ? Food-and nutrition-related knowledge deficit related to lack of exposure to information as related to diagnosis of pulmonary disease ? Increased energy expenditure related to increased energy requirements as evidenced by BMI <20.  Nutrition Intervention ? Pt's individual nutrition plan and goals reviewed with pt. ? Benefits of adopting healthy eating habits  discussed when pt's Rate Your Plate reviewed. ? Handouts given for: Local restaurants and food delivery companies that prepare meals; High Calorie, High Protein diet, recipes and suggestions for increasing calories and protein.   Goal(s) 1. The pt will recognize symptoms that can interfere with adequate oral intake, such as shortness of breath, N/V, early satiety, fatigue, ability to secure and prepare food, taste and smell changes, chewing/swallowing difficulties, and/ or pain when eating. 2. The pt will consume high-energy, high-nutrient dense beverages when necessary to compensate for decreased oral intake of solid foods. 3. Identify food quantities necessary to achieve wt gain of  -2# per week to a goal wt gain of 2.7-10.9 kg (6-24 lb) at graduation from pulmonary rehab.  Plan:  Pt to attend Pulmonary Nutrition class Will provide client-centered nutrition education as part of interdisciplinary care.   Monitor and evaluate progress toward nutrition goal with team.  Monitor and Evaluate progress toward nutrition goal with team.   Mickle PlumbEdna Lenore Moyano, M.Ed, RD, LDN, CDE 08/10/2017 3:12 PM

## 2017-08-10 NOTE — Progress Notes (Signed)
Daily Session Note  Patient Details  Name: Sara Beasley MRN: [id number] Date of Birth: 1945/09/13 Referring Provider:     Pulmonary Rehab Walk Test from 07/29/2017 in [place]  Referring Provider  Dr. Nelda Marseille      Encounter Date: 08/10/2017  Check In: Session Check In - 08/10/17 1543      Check-In   Location  [facility]    Staff Present  Su Hilt, MS, ACSM RCEP, Exercise Physiologist;Portia Rollene Rotunda, RN, Roque Cash, RN    Supervising physician immediately available to respond to emergencies  [facility] immediately available    Physician(s)  Dr. Alfredia Ferguson    Medication changes reported      No    Fall or balance concerns reported     No    Tobacco Cessation  No Change    Warm-up and Cool-down  Performed as group-led instruction    Resistance Training Performed  Yes    VAD Patient?  No      Pain Assessment   Currently in Pain?  No/denies    Multiple Pain Sites  No       Capillary Blood Glucose: No results found for this or any previous visit (from the past 24 hour(s)).  Exercise Prescription Changes - 08/10/17 1500      Response to Exercise   Blood Pressure (Admit)  150/94    Blood Pressure (Exercise)  140/100    Blood Pressure (Exit)  118/82    Heart Rate (Admit)  75 bpm    Heart Rate (Exercise)  79 bpm    Heart Rate (Exit)  68 bpm    Oxygen Saturation (Admit)  97 %    Oxygen Saturation (Exercise)  93 %    Oxygen Saturation (Exit)  98 %    Rating of Perceived Exertion (Exercise)  12    Perceived Dyspnea (Exercise)  2    Duration  Progress to 45 minutes of aerobic exercise without signs/symptoms of physical distress    Intensity  Other (comment) 40-80% of HRR      Progression   Progression  Continue to progress workloads to maintain intensity without signs/symptoms of physical distress.      Resistance Training   Training Prescription  Yes    Weight  blue bands    Reps  10-15    Time  10  Minutes      Interval Training   Interval Training  Yes      NuStep   Level  1    SPM  80    Minutes  51    METs  1.2       Social History   Tobacco Use  Smoking Status Former Smoker  . Packs/day: 1.00  . Years: 15.00  . Pack years: 15.00  . Types: Cigarettes  . Last attempt to quit: 04/27/1974  . Years since quitting: 43.3  Smokeless Tobacco Never Used    Goals Met:  No report of cardiac concerns or symptoms Strength training completed today  Goals Unmet:  Not Applicable  Comments: Service time is from 1330 to 1515    Dr. Rush Farmer is Medical Director for Pulmonary Rehab at [facility].

## 2017-08-12 ENCOUNTER — Encounter (HOSPITAL_COMMUNITY)
Admission: RE | Admit: 2017-08-12 | Discharge: 2017-08-12 | Disposition: A | Discharge status: Home / Self Care | Payer: Medicare Other | Source: Ambulatory Visit | Attending: Pulmonary Disease | Admitting: Pulmonary Disease

## 2017-08-12 ENCOUNTER — Encounter (HOSPITAL_COMMUNITY): Payer: Medicare Other

## 2017-08-12 DIAGNOSIS — J849 Interstitial pulmonary disease, unspecified: Secondary | ICD-10-CM | POA: Diagnosis not present

## 2017-08-12 NOTE — Progress Notes (Signed)
Daily Session Note  Patient Details  Name: Sara Beasley MRN: [id number] Date of Birth: [birth year] Referring Provider:     Pulmonary Rehab Walk Test from 07/29/2017 in [place]  Referring Provider  Dr. Nelda Marseille      Encounter Date: 08/12/2017  Check In: Session Check In - 08/12/17 1602      Check-In   Location  [facility]    Staff Present  Rodney Langton, RN;Molly DiVincenzo, MS, ACSM RCEP, Exercise Physiologist;Toshia Larkin Rollene Rotunda, RN, BSN;Carlette Wilber Oliphant, RN, BSN    Supervising physician immediately available to respond to emergencies  [facility] immediately available    Physician(s)  Dr. Cruzita Lederer    Medication changes reported      No    Fall or balance concerns reported     No    Tobacco Cessation  No Change    Warm-up and Cool-down  Performed as group-led instruction    Resistance Training Performed  Yes    VAD Patient?  No      Pain Assessment   Currently in Pain?  No/denies    Multiple Pain Sites  No       Capillary Blood Glucose: No results found for this or any previous visit (from the past 24 hour(s)).    Social History   Tobacco Use  Smoking Status Former Smoker  . Packs/day: 1.00  . Years: 15.00  . Pack years: 15.00  . Types: Cigarettes  . Last attempt to quit: 04/27/1974  . Years since quitting: 43.3  Smokeless Tobacco Never Used    Goals Met:  Exercise tolerated well Queuing for purse lip breathing No report of cardiac concerns or symptoms Strength training completed today  Goals Unmet:  Not Applicable  Comments: Service time is from 1330 to 1540   Dr. Rush Farmer is Medical Director for Pulmonary Rehab at [facility].

## 2017-08-17 ENCOUNTER — Encounter (HOSPITAL_COMMUNITY)
Admission: RE | Admit: 2017-08-17 | Discharge: 2017-08-17 | Disposition: A | Discharge status: Home / Self Care | Payer: Medicare Other | Source: Ambulatory Visit | Attending: Pulmonary Disease | Admitting: Pulmonary Disease

## 2017-08-17 ENCOUNTER — Encounter (HOSPITAL_COMMUNITY): Payer: Medicare Other

## 2017-08-17 DIAGNOSIS — J849 Interstitial pulmonary disease, unspecified: Secondary | ICD-10-CM | POA: Diagnosis not present

## 2017-08-17 NOTE — Progress Notes (Signed)
Daily Session Note  Patient Details  Name: Sara Beasley MRN: [id number] Date of Birth: 07/30/1945 Referring Provider:     Pulmonary Rehab Walk Test from 07/29/2017 in [facility]  Referring Provider  Dr. Yacoub      Encounter Date: 08/17/2017  Check In: Session Check In - 08/17/17 1529      Check-In   Location  [facility]    Staff Present  Molly DiVincenzo, MS, ACSM RCEP, Exercise Physiologist; , RN    Supervising physician immediately available to respond to emergencies  [facility] immediately available    Physician(s)  Dr. Choi    Medication changes reported      No    Fall or balance concerns reported     No    Tobacco Cessation  No Change    Warm-up and Cool-down  Performed as group-led instruction    Resistance Training Performed  Yes    VAD Patient?  No      Pain Assessment   Currently in Pain?  No/denies    Multiple Pain Sites  No       Capillary Blood Glucose: No results found for this or any previous visit (from the past 24 hour(s)).    Social History   Tobacco Use  Smoking Status Former Smoker  . Packs/day: 1.00  . Years: 15.00  . Pack years: 15.00  . Types: Cigarettes  . Last attempt to quit: 04/27/1974  . Years since quitting: 43.3  Smokeless Tobacco Never Used    Goals Met:  Exercise tolerated well No report of cardiac concerns or symptoms Strength training completed today  Goals Unmet:  Not Applicable  Comments: Service time is from 1330 to 1500    Dr. Wesam G. Yacoub is Medical Director for Pulmonary Rehab at [facility]. 

## 2017-08-19 ENCOUNTER — Encounter (HOSPITAL_COMMUNITY)
Admission: RE | Admit: 2017-08-19 | Discharge: 2017-08-19 | Disposition: A | Discharge status: Home / Self Care | Payer: Medicare Other | Source: Ambulatory Visit | Attending: Pulmonary Disease | Admitting: Pulmonary Disease

## 2017-08-19 DIAGNOSIS — J849 Interstitial pulmonary disease, unspecified: Secondary | ICD-10-CM

## 2017-08-19 NOTE — Progress Notes (Signed)
Daily Session Note  Patient Details  Name: Sara Beasley MRN: [id number] Date of Birth: [birth year] Referring Provider:     Pulmonary Rehab Walk Test from 07/29/2017 in [place]  Referring Provider  Dr. Nelda Marseille      Encounter Date: 08/19/2017  Check In: Session Check In - 08/19/17 1244      Check-In   Location  [facility]    Staff Present  Cloyde Reams DiVincenzo, MS, ACSM RCEP, Exercise Physiologist;Fitzpatrick Alberico Ysidro Evert, RN    Supervising physician immediately available to respond to emergencies  [facility] immediately available    Physician(s)  Dr. Maylene Roes    Medication changes reported      No    Fall or balance concerns reported     No    Tobacco Cessation  No Change    Warm-up and Cool-down  Performed as group-led instruction    Resistance Training Performed  Yes    VAD Patient?  No      Pain Assessment   Currently in Pain?  No/denies    Multiple Pain Sites  No       Capillary Blood Glucose: No results found for this or any previous visit (from the past 24 hour(s)).    Social History   Tobacco Use  Smoking Status Former Smoker  . Packs/day: 1.00  . Years: 15.00  . Pack years: 15.00  . Types: Cigarettes  . Last attempt to quit: 04/27/1974  . Years since quitting: 43.3  Smokeless Tobacco Never Used    Goals Met:  Exercise tolerated well No report of cardiac concerns or symptoms Strength training completed today  Goals Unmet:  Not Applicable  Comments: Service time is from 1230 to 1435    Dr. Rush Farmer is Medical Director for Pulmonary Rehab at [facility].

## 2017-08-24 ENCOUNTER — Encounter (HOSPITAL_COMMUNITY): Payer: Medicare Other

## 2017-08-24 ENCOUNTER — Encounter (HOSPITAL_COMMUNITY)
Admission: RE | Admit: 2017-08-24 | Discharge: 2017-08-24 | Disposition: A | Discharge status: Home / Self Care | Payer: Medicare Other | Source: Ambulatory Visit | Attending: Pulmonary Disease | Admitting: Pulmonary Disease

## 2017-08-24 VITALS — Wt 97.0 lb

## 2017-08-24 DIAGNOSIS — J849 Interstitial pulmonary disease, unspecified: Secondary | ICD-10-CM | POA: Diagnosis not present

## 2017-08-24 NOTE — Progress Notes (Signed)
Daily Session Note  Patient Details  Name: Sara Beasley MRN: [id number] Date of Birth: Sep 03, 1945 Referring Provider:     Pulmonary Rehab Walk Test from 07/29/2017 in [place]  Referring Provider  Dr. Nelda Marseille      Encounter Date: 08/24/2017  Check In: Session Check In - 08/24/17 1605      Check-In   Location  [facility]    Staff Present  Cloyde Reams Chelesea Weiand, MS, ACSM RCEP, Exercise Physiologist;Lisa Ysidro Evert, RN;Portia Rollene Rotunda, RN, BSN    Supervising physician immediately available to respond to emergencies  [facility] immediately available    Physician(s)  Dr. Maylene Roes    Medication changes reported      No    Fall or balance concerns reported     No    Tobacco Cessation  No Change    Warm-up and Cool-down  Not performed (comment) arrived late for warm up    Resistance Training Performed  No    VAD Patient?  No      Pain Assessment   Currently in Pain?  No/denies    Multiple Pain Sites  No       Capillary Blood Glucose: No results found for this or any previous visit (from the past 24 hour(s)).  Exercise Prescription Changes - 08/24/17 1600      Response to Exercise   Blood Pressure (Admit)  122/90    Blood Pressure (Exercise)  140/90    Blood Pressure (Exit)  138/84    Heart Rate (Admit)  77 bpm    Heart Rate (Exercise)  86 bpm    Heart Rate (Exit)  78 bpm    Oxygen Saturation (Admit)  94 %    Oxygen Saturation (Exercise)  90 %    Oxygen Saturation (Exit)  94 %    Rating of Perceived Exertion (Exercise)  11    Perceived Dyspnea (Exercise)  2    Duration  Progress to 45 minutes of aerobic exercise without signs/symptoms of physical distress    Intensity  Other (comment) 40-80% of HRR      Progression   Progression  Continue to progress workloads to maintain intensity without signs/symptoms of physical distress.      Resistance Training   Training Prescription  Yes    Weight  blue bands    Reps  10-15    Time   10 Minutes      Interval Training   Interval Training  Yes      NuStep   Level  --    SPM  --    Minutes  --    METs  --      Track   Laps  7    Minutes  17       Social History   Tobacco Use  Smoking Status Former Smoker  . Packs/day: 1.00  . Years: 15.00  . Pack years: 15.00  . Types: Cigarettes  . Last attempt to quit: 04/27/1974  . Years since quitting: 43.3  Smokeless Tobacco Never Used    Goals Met:  Exercise tolerated well No report of cardiac concerns or symptoms Strength training completed today  Goals Unmet:  Not Applicable  Comments: Service time is from 1:30p to 3:00p    Dr. Rush Farmer is Medical Director for Pulmonary Rehab at [facility].

## 2017-08-26 ENCOUNTER — Encounter (HOSPITAL_COMMUNITY)
Admission: RE | Admit: 2017-08-26 | Discharge: 2017-08-26 | Disposition: A | Discharge status: Home / Self Care | Payer: Medicare Other | Source: Ambulatory Visit | Attending: Pulmonary Disease | Admitting: Pulmonary Disease

## 2017-08-26 ENCOUNTER — Encounter (HOSPITAL_COMMUNITY): Payer: Medicare Other

## 2017-08-26 DIAGNOSIS — Z79899 Other long term (current) drug therapy: Secondary | ICD-10-CM | POA: Insufficient documentation

## 2017-08-26 DIAGNOSIS — J849 Interstitial pulmonary disease, unspecified: Secondary | ICD-10-CM | POA: Diagnosis present

## 2017-08-26 DIAGNOSIS — Z87891 Personal history of nicotine dependence: Secondary | ICD-10-CM | POA: Diagnosis not present

## 2017-08-26 NOTE — Progress Notes (Signed)
Daily Session Note  Patient Details  Name: Sara Beasley MRN: [id number] Date of Birth: May 21, 1945 Referring Provider:     Pulmonary Rehab Walk Test from 07/29/2017 in [place]  Referring Provider  Dr. Nelda Marseille      Encounter Date: 08/26/2017  Check In:   Capillary Blood Glucose: No results found for this or any previous visit (from the past 24 hour(s)).    Social History   Tobacco Use  Smoking Status Former Smoker  . Packs/day: 1.00  . Years: 15.00  . Pack years: 15.00  . Types: Cigarettes  . Last attempt to quit: 04/27/1974  . Years since quitting: 43.3  Smokeless Tobacco Never Used    Goals Met:  No report of cardiac concerns or symptoms Strength training completed today  Goals Unmet:  Not Applicable  Comments: Service time is from 1415 to 1545    Dr. Rush Farmer is Medical Director for Pulmonary Rehab at [facility].

## 2017-08-31 ENCOUNTER — Encounter (HOSPITAL_COMMUNITY)
Admission: RE | Admit: 2017-08-31 | Discharge: 2017-08-31 | Disposition: A | Discharge status: Home / Self Care | Payer: Medicare Other | Source: Ambulatory Visit | Attending: Pulmonary Disease | Admitting: Pulmonary Disease

## 2017-08-31 DIAGNOSIS — J849 Interstitial pulmonary disease, unspecified: Secondary | ICD-10-CM | POA: Diagnosis not present

## 2017-08-31 NOTE — Progress Notes (Signed)
Daily Session Note  Patient Details  Name: Sara Beasley MRN: [id number] Date of Birth: November 25, 1945 Referring Provider:     Pulmonary Rehab Walk Test from 07/29/2017 in [place]  Referring Provider  Dr. Nelda Marseille      Encounter Date: 08/31/2017  Check In: Session Check In - 08/31/17 1330      Check-In   Location  [facility]    Staff Present  Cloyde Reams DiVincenzo, MS, ACSM RCEP, Exercise Physiologist;Jackie Russman Ysidro Evert, RN;Portia Rollene Rotunda, RN, BSN;Other;Carlette Carlton, RN, BSN    Supervising physician immediately available to respond to emergencies  [facility] immediately available    Physician(s)  Dr. Reesa Chew    Medication changes reported      No    Fall or balance concerns reported     No    Tobacco Cessation  No Change    Warm-up and Cool-down  Performed as group-led instruction    Resistance Training Performed  Yes    VAD Patient?  No      Pain Assessment   Currently in Pain?  No/denies    Multiple Pain Sites  No       Capillary Blood Glucose: No results found for this or any previous visit (from the past 24 hour(s)).    Social History   Tobacco Use  Smoking Status Former Smoker  . Packs/day: 1.00  . Years: 15.00  . Pack years: 15.00  . Types: Cigarettes  . Last attempt to quit: 04/27/1974  . Years since quitting: 43.3  Smokeless Tobacco Never Used    Goals Met:  Exercise tolerated well No report of cardiac concerns or symptoms Strength training completed today  Goals Unmet:  Not Applicable  Comments: Service time is from 1330 to 1510    Dr. Rush Farmer is Medical Director for Pulmonary Rehab at [facility].

## 2017-09-02 ENCOUNTER — Encounter (HOSPITAL_COMMUNITY)
Admission: RE | Admit: 2017-09-02 | Discharge: 2017-09-02 | Disposition: A | Discharge status: Home / Self Care | Payer: Medicare Other | Source: Ambulatory Visit | Attending: Pulmonary Disease | Admitting: Pulmonary Disease

## 2017-09-02 DIAGNOSIS — J849 Interstitial pulmonary disease, unspecified: Secondary | ICD-10-CM | POA: Diagnosis not present

## 2017-09-02 NOTE — Progress Notes (Signed)
Daily Session Note  Patient Details  Name: Sara Beasley MRN: [id number] Date of Birth: 14-Nov-1945 Referring Provider:     Pulmonary Rehab Walk Test from 07/29/2017 in [place]  Referring Provider  Dr. Nelda Marseille      Encounter Date: 09/02/2017  Check In: Session Check In - 09/02/17 1406      Check-In   Location  [facility]    Staff Present  Rodney Langton, RN;Molly DiVincenzo, MS, ACSM RCEP, Exercise Physiologist;Portia Rollene Rotunda, RN, BSN    Supervising physician immediately available to respond to emergencies  [facility] immediately available    Physician(s)  Dr. Stevphen Meuse    Medication changes reported      No    Fall or balance concerns reported     No    Tobacco Cessation  No Change    Warm-up and Cool-down  Performed as group-led instruction    Resistance Training Performed  Yes    VAD Patient?  No      Pain Assessment   Currently in Pain?  No/denies    Multiple Pain Sites  No       Capillary Blood Glucose: No results found for this or any previous visit (from the past 24 hour(s)).    Social History   Tobacco Use  Smoking Status Former Smoker  . Packs/day: 1.00  . Years: 15.00  . Pack years: 15.00  . Types: Cigarettes  . Last attempt to quit: 04/27/1974  . Years since quitting: 43.3  Smokeless Tobacco Never Used    Goals Met:  Exercise tolerated well No report of cardiac concerns or symptoms Strength training completed today  Goals Unmet:  Not Applicable  Comments: Service time is from 1330 to 1545    Dr. Rush Farmer is Medical Director for Pulmonary Rehab at [facility].

## 2017-09-02 NOTE — Progress Notes (Signed)
Pulmonary Individual Treatment Plan  Patient Details  Name: Sara Beasley MRN: [id number] Date of Birth: 27-Oct-1945 Referring Provider:     Pulmonary Rehab Walk Test from 07/29/2017 in [facility]  Referring Provider  Dr. Nelda Marseille      Initial Encounter Date:    Pulmonary Rehab Walk Test from 07/29/2017 in [facility]  Date  07/29/17  Referring Provider  Dr. Nelda Marseille      Visit Diagnosis: Interstitial pulmonary disease, unspecified ([place])  Patient's Home Medications on Admission:   Current Outpatient Medications:  .  ALPRAZolam (XANAX) 0.5 MG tablet, Take by mouth 3 (three) times daily as needed. Take 1/2 tab up to TID PRN, Disp: , Rfl:  .  azathioprine (IMURAN) 100 MG tablet, Take 100 mg by mouth daily., Disp: , Rfl:  .  calcium carbonate (OS-CAL) 600 MG TABS, Take 600 mg by mouth every other day. , Disp: , Rfl:  .  cetirizine (ZYRTEC) 10 MG tablet, Take 5 mg by mouth daily., Disp: , Rfl:  .  cholecalciferol (VITAMIN D) 1000 UNITS tablet, Take 2,000 Units by mouth daily. , Disp: , Rfl:  .  escitalopram (LEXAPRO) 10 MG tablet, 10 mg daily. , Disp: , Rfl:  .  ipratropium-albuterol (DUONEB) 0.5-2.5 (3) MG/3ML SOLN, Take 3 mLs by nebulization every 4 (four) hours as needed (SOB, cough, wheezing). (Patient not taking: Reported on 07/26/2017), Disp: 360 mL, Rfl: 0 .  mirtazapine (REMERON SOL-TAB) 15 MG disintegrating tablet, Take 7.5 mg by mouth at bedtime. , Disp: , Rfl:  .  montelukast (SINGULAIR) 10 MG tablet, Take 0.5 tablets (5 mg total) by mouth at bedtime., Disp: 30 tablet, Rfl: 12 .  Multiple Vitamin (MULTIVITAMIN) tablet, Take 1 tablet by mouth every other day. , Disp: , Rfl:  .  predniSONE (DELTASONE) 10 MG tablet, Take 2 tabs daily for 1 week, then resume taking 1 tab daily, Disp: , Rfl:   Past Medical History: Past Medical History:  Diagnosis Date  . ALLERGIC RHINITIS   . Allergy   . Chronic sinusitis   . Lung  infection    sees Dr. Annamaria Boots- had infection 2015  . Osteopenia     Tobacco Use: Social History   Tobacco Use  Smoking Status Former Smoker  . Packs/day: 1.00  . Years: 15.00  . Pack years: 15.00  . Types: Cigarettes  . Last attempt to quit: 04/27/1974  . Years since quitting: 43.3  Smokeless Tobacco Never Used    Labs: Recent Review Flowsheet Data    There is no flowsheet data to display.      Capillary Blood Glucose: No results found for: GLUCAP   Pulmonary Assessment Scores: Pulmonary Assessment Scores    Row Name 07/28/17 1038 07/30/17 0704       ADL UCSD   ADL Phase  Entry  Entry    SOB Score total  56  -      CAT Score   CAT Score  20 Entry  -      mMRC Score   mMRC Score  -  1       Pulmonary Function Assessment:   Exercise Target Goals:    Exercise Program Goal: Individual exercise prescription set using results from initial 6 min walk test and THRR while considering  patient's activity barriers and safety.    Exercise Prescription Goal: Initial exercise prescription builds to 30-45 minutes a day of aerobic activity, 2-3 days per week.  Home  exercise guidelines will be given to patient during program as part of exercise prescription that the participant will acknowledge.  Activity Barriers & Risk Stratification: Activity Barriers & Cardiac Risk Stratification - 07/26/17 1441      Activity Barriers & Cardiac Risk Stratification   Activity Barriers  Deconditioning;Shortness of Breath;Muscular Weakness       6 Minute Walk: 6 Minute Walk    Row Name 07/30/17 0704         6 Minute Walk   Phase  Initial     Distance  1123 feet     Walk Time  6 minutes     # of Rest Breaks  0     MPH  2.12     METS  2.61     RPE  13     Perceived Dyspnea   3     Symptoms  No     Resting HR  80 bpm     Resting BP  160/90     Resting Oxygen Saturation   94 %     Exercise Oxygen Saturation  during 6 min walk  87 %     Max Ex. HR  107 bpm     Max Ex. BP   178/90     2 Minute Post BP  150/100       Interval HR   1 Minute HR  92     2 Minute HR  93     3 Minute HR  97     4 Minute HR  97     5 Minute HR  100     6 Minute HR  107     2 Minute Post HR  85     Interval Heart Rate?  Yes       Interval Oxygen   Interval Oxygen?  Yes     Baseline Oxygen Saturation %  94 %     1 Minute Oxygen Saturation %  94 %     1 Minute Liters of Oxygen  0 L     2 Minute Oxygen Saturation %  91 %     2 Minute Liters of Oxygen  0 L     3 Minute Oxygen Saturation %  88 %     3 Minute Liters of Oxygen  0 L     4 Minute Oxygen Saturation %  88 %     4 Minute Liters of Oxygen  0 L     5 Minute Oxygen Saturation %  87 %     5 Minute Liters of Oxygen  0 L     6 Minute Oxygen Saturation %  94 %     6 Minute Liters of Oxygen  0 L     2 Minute Post Oxygen Saturation %  92 %     2 Minute Post Liters of Oxygen  0 L        Oxygen Initial Assessment: Oxygen Initial Assessment - 07/30/17 0703      Initial 6 min Walk   Oxygen Used  None      Program Oxygen Prescription   Program Oxygen Prescription  None patient desaturated to 87% during 6MWT will re-evaluate        Oxygen Re-Evaluation: Oxygen Re-Evaluation    Row Name 08/31/17 0818             Program Oxygen Prescription   Program Oxygen Prescription  None  Home Oxygen   Home Oxygen Device  None       Sleep Oxygen Prescription  None       Home Exercise Oxygen Prescription  None       Home at Rest Exercise Oxygen Prescription  None          Oxygen Discharge (Final Oxygen Re-Evaluation): Oxygen Re-Evaluation - 08/31/17 0818      Program Oxygen Prescription   Program Oxygen Prescription  None      Home Oxygen   Home Oxygen Device  None    Sleep Oxygen Prescription  None    Home Exercise Oxygen Prescription  None    Home at Rest Exercise Oxygen Prescription  None       Initial Exercise Prescription: Initial Exercise Prescription - 07/30/17 0700      Date of Initial  Exercise RX and Referring Provider   Date  07/29/17    Referring Provider  Dr. Nelda Marseille      Bike   Level  1 scitfit    Watts  10    Minutes  17      NuStep   Level  1    SPM  80    Minutes  17    METs  1.5      Track   Laps  8    Minutes  17      Prescription Details   Frequency (times per week)  2    Duration  Progress to 45 minutes of aerobic exercise without signs/symptoms of physical distress      Intensity   THRR 40-80% of Max Heartrate  59-118    Ratings of Perceived Exertion  11-13    Perceived Dyspnea  0-4      Progression   Progression  Continue progressive overload as per policy without signs/symptoms or physical distress.      Resistance Training   Training Prescription  Yes    Weight  blue bands    Reps  10-15       Perform Capillary Blood Glucose checks as needed.  Exercise Prescription Changes: Exercise Prescription Changes    Row Name 08/10/17 1500 08/24/17 1600           Response to Exercise   Blood Pressure (Admit)  150/94  122/90      Blood Pressure (Exercise)  140/100  140/90      Blood Pressure (Exit)  118/82  138/84      Heart Rate (Admit)  75 bpm  77 bpm      Heart Rate (Exercise)  79 bpm  86 bpm      Heart Rate (Exit)  68 bpm  78 bpm      Oxygen Saturation (Admit)  97 %  94 %      Oxygen Saturation (Exercise)  93 %  90 %      Oxygen Saturation (Exit)  98 %  94 %      Rating of Perceived Exertion (Exercise)  12  11      Perceived Dyspnea (Exercise)  2  2      Duration  Progress to 45 minutes of aerobic exercise without signs/symptoms of physical distress  Progress to 45 minutes of aerobic exercise without signs/symptoms of physical distress      Intensity  Other (comment) 40-80% of HRR  Other (comment) 40-80% of HRR        Progression   Progression  Continue to progress workloads to maintain intensity without signs/symptoms  of physical distress.  Continue to progress workloads to maintain intensity without signs/symptoms of physical  distress.        Resistance Training   Training Prescription  Yes  Yes      Weight  blue bands  blue bands      Reps  10-15  10-15      Time  10 Minutes  10 Minutes        Interval Training   Interval Training  Yes  Yes        NuStep   Level  1  -      SPM  80  -      Minutes  51  -      METs  1.2  -        Track   Laps  -  7      Minutes  -  17         Exercise Comments:   Exercise Goals and Review: Exercise Goals    Row Name 07/26/17 1441             Exercise Goals   Increase Physical Activity  Yes       Expected Outcomes  Short Term: Attend rehab on a regular basis to increase amount of physical activity.;Long Term: Add in home exercise to make exercise part of routine and to increase amount of physical activity.;Long Term: Exercising regularly at least 3-5 days a week.       Increase Strength and Stamina  Yes       Intervention  Provide advice, education, support and counseling about physical activity/exercise needs.;Develop an individualized exercise prescription for aerobic and resistive training based on initial evaluation findings, risk stratification, comorbidities and participant's personal goals.       Expected Outcomes  Short Term: Increase workloads from initial exercise prescription for resistance, speed, and METs.;Short Term: Perform resistance training exercises routinely during rehab and add in resistance training at home;Long Term: Improve cardiorespiratory fitness, muscular endurance and strength as measured by increased METs and functional capacity (6MWT)       Able to understand and use rate of perceived exertion (RPE) scale  Yes       Intervention  Provide education and explanation on how to use RPE scale       Expected Outcomes  Short Term: Able to use RPE daily in rehab to express subjective intensity level;Long Term:  Able to use RPE to guide intensity level when exercising independently       Able to understand and use Dyspnea scale  Yes        Intervention  Provide education and explanation on how to use Dyspnea scale       Expected Outcomes  Short Term: Able to use Dyspnea scale daily in rehab to express subjective sense of shortness of breath during exertion;Long Term: Able to use Dyspnea scale to guide intensity level when exercising independently       Knowledge and understanding of Target Heart Rate Range (THRR)  Yes       Intervention  Provide education and explanation of THRR including how the numbers were predicted and where they are located for reference       Expected Outcomes  Short Term: Able to state/look up THRR;Short Term: Able to use daily as guideline for intensity in rehab;Long Term: Able to use THRR to govern intensity when exercising independently       Understanding of Exercise Prescription  Yes  Intervention  Provide education, explanation, and written materials on patient's individual exercise prescription       Expected Outcomes  Short Term: Able to explain program exercise prescription;Long Term: Able to explain home exercise prescription to exercise independently          Exercise Goals Re-Evaluation : Exercise Goals Re-Evaluation    Row Name 08/31/17 0818             Exercise Goal Re-Evaluation   Exercise Goals Review  Increase Physical Activity;Increase Strength and Stamina;Able to understand and use Dyspnea scale;Able to understand and use rate of perceived exertion (RPE) scale;Knowledge and understanding of Target Heart Rate Range (THRR);Understanding of Exercise Prescription       Comments  Patient has only attended 5 complete rehab sessions. Due to severity of disease and deconditioning, patient is going to progress slowly in program. MET average places her in a low functional level. She is able to complete 7-8 laps (250f each) in 15 minutes. Will cont. to monitor and motivate as able.        Expected Outcomes  Through exercise at rehab and at home, patient will increase physical capacity and be  able to carry out ADL's with ease at home. Patient will also gain the confidence and knowledge to adhere to an exercise regime at home.          Discharge Exercise Prescription (Final Exercise Prescription Changes): Exercise Prescription Changes - 08/24/17 1600      Response to Exercise   Blood Pressure (Admit)  122/90    Blood Pressure (Exercise)  140/90    Blood Pressure (Exit)  138/84    Heart Rate (Admit)  77 bpm    Heart Rate (Exercise)  86 bpm    Heart Rate (Exit)  78 bpm    Oxygen Saturation (Admit)  94 %    Oxygen Saturation (Exercise)  90 %    Oxygen Saturation (Exit)  94 %    Rating of Perceived Exertion (Exercise)  11    Perceived Dyspnea (Exercise)  2    Duration  Progress to 45 minutes of aerobic exercise without signs/symptoms of physical distress    Intensity  Other (comment) 40-80% of HRR      Progression   Progression  Continue to progress workloads to maintain intensity without signs/symptoms of physical distress.      Resistance Training   Training Prescription  Yes    Weight  blue bands    Reps  10-15    Time  10 Minutes      Interval Training   Interval Training  Yes      NuStep   Level  --    SPM  --    Minutes  --    METs  --      Track   Laps  7    Minutes  17       Nutrition:  Target Goals: Understanding of nutrition guidelines, daily intake of sodium <15010m cholesterol <20044mcalories 30% from fat and 7% or less from saturated fats, daily to have 5 or more servings of fruits and vegetables.  Biometrics: Pre Biometrics - 07/26/17 1531      Pre Biometrics   Grip Strength  26 kg        Nutrition Therapy Plan and Nutrition Goals: Nutrition Therapy & Goals - 07/30/17 1413      Nutrition Therapy   Diet  High Calorie, High Protein      Personal Nutrition Goals  Nutrition Goal  The pt will recognize symptoms that can interfere with adequate oral intake, such as shortness of breath, N/V, early satiety, fatigue, ability to secure  and prepare food, taste and smell changes, chewing/swallowing difficulties, and/ or pain when eating.    Personal Goal #2  The pt will consume high-energy, high-nutrient dense beverages when necessary to compensate for decreased oral intake of solid foods.    Personal Goal #3  Identify food quantities necessary to achieve wt gain of  -2# per week to a goal wt gain of 2.7-10.9 kg (6-24 lb) at graduation from pulmonary rehab.      Intervention Plan   Intervention  Prescribe, educate and counsel regarding individualized specific dietary modifications aiming towards targeted core components such as weight, hypertension, lipid management, diabetes, heart failure and other comorbidities.    Expected Outcomes  Short Term Goal: Understand basic principles of dietary content, such as calories, fat, sodium, cholesterol and nutrients.;Long Term Goal: Adherence to prescribed nutrition plan.       Nutrition Assessments: Nutrition Assessments - 07/30/17 1406      Rate Your Plate Scores   Pre Score  48      MEDFICTS Scores   Pre Score  --       Nutrition Goals Re-Evaluation:   Nutrition Goals Discharge (Final Nutrition Goals Re-Evaluation):   Psychosocial: Target Goals: Acknowledge presence or absence of significant depression and/or stress, maximize coping skills, provide positive support system. Participant is able to verbalize types and ability to use techniques and skills needed for reducing stress and depression.  Initial Review & Psychosocial Screening: Initial Psych Review & Screening - 07/26/17 1548      Family Dynamics   Comments  patient states she is grieving the recent loss of "who I was". because of her declining pulmonary health she is no longer able to maintaining an active lifestyle such as biking and hiking and she is being treated for depression.      Screening Interventions   Interventions  Encouraged to exercise;To provide support and resources with identified psychosocial  needs;Provide feedback about the scores to participant    Expected Outcomes  Short Term goal: Utilizing psychosocial counselor, staff and physician to assist with identification of specific Stressors or current issues interfering with healing process. Setting desired goal for each stressor or current issue identified.;Long Term Goal: Stressors or current issues are controlled or eliminated.;Short Term goal: Identification and review with participant of any Quality of Life or Depression concerns found by scoring the questionnaire.;Long Term goal: The participant improves quality of Life and PHQ9 Scores as seen by post scores and/or verbalization of changes       Quality of Life Scores:  Scores of 19 and below usually indicate a poorer quality of life in these areas.  A difference of  2-3 points is a clinically meaningful difference.  A difference of 2-3 points in the total score of the Quality of Life Index has been associated with significant improvement in overall quality of life, self-image, physical symptoms, and general health in studies assessing change in quality of life.   PHQ-9: Recent Review Flowsheet Data    Depression screen [facility] 2/9 07/26/2017   Decreased Interest 0   Down, Depressed, Hopeless 1   PHQ - 2 Score 1   Altered sleeping 0   Tired, decreased energy 3   Change in appetite 0   Feeling bad or failure about yourself  0   Trouble concentrating 0   Moving slowly  or fidgety/restless 0   Suicidal thoughts 0   PHQ-9 Score 4   Difficult doing work/chores Not difficult at all     Interpretation of Total Score  Total Score Depression Severity:  1-4 = Minimal depression, 5-9 = Mild depression, 10-14 = Moderate depression, 15-19 = Moderately severe depression, 20-27 = Severe depression   Psychosocial Evaluation and Intervention: Psychosocial Evaluation - 07/26/17 1553      Psychosocial Evaluation & Interventions   Interventions  Encouraged to exercise with the program and  follow exercise prescription patient currently seeing psychiatrist    Comments  patient has been started on lexapro and has a follow up with her psychiatrist on Thursday 08/05/16    Continue Psychosocial Services   Follow up required by staff       Psychosocial Re-Evaluation: Psychosocial Re-Evaluation    [place] Name 08/30/17 1805             Psychosocial Re-Evaluation   Current issues with  Current Depression;Current Anxiety/Panic;Current Psychotropic Meds;Current Stress Concerns       Comments  patient has significant depression and anxiety over the loss "of who I was". she is having a very difficult time acception her current health status and an even more difficult time accepting her malnutrition. she is currently seeing a psychiatrist.       Expected Outcomes  patient will remain free from psychosocial barriers to participation in pulmonary rehab.       Interventions  Encouraged to attend Pulmonary Rehabilitation for the exercise       Continue Psychosocial Services   Follow up required by staff       Comments  patient is grieving the loss of her health. she is malnurished and she fails to accept a needed intervention. 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         Initial Review   Source of Stress Concerns  Chronic Illness;Poor Coping Skills;Family;Retirement/disability;Unable to participate in former interests or hobbies;Unable to perform yard/household activities          Psychosocial Discharge (Final Psychosocial Re-Evaluation): Psychosocial Re-Evaluation - 08/30/17 1805      Psychosocial Re-Evaluation   Current issues with  Current Depression;Current Anxiety/Panic;Current Psychotropic Meds;Current Stress Concerns    Comments  patient has significant depression and anxiety over the loss "of who I was". she is having a very difficult time acception her current  health status and an even more difficult time accepting her malnutrition. she is currently seeing a psychiatrist.    Expected Outcomes  patient will remain free from psychosocial barriers to participation in pulmonary rehab.    Interventions  Encouraged to attend Pulmonary Rehabilitation for the exercise    Continue Psychosocial Services   Follow up required by staff    Comments  patient is grieving the loss of her health. she is malnurished and she fails to accept a needed intervention. 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      Initial Review   Source of Stress Concerns  Chronic Illness;Poor Coping Skills;Family;Retirement/disability;Unable to participate in former interests or hobbies;Unable to perform yard/household activities       Education: Education Goals: Education classes will be provided on a weekly basis, covering required topics. Participant will state understanding/return demonstration of topics presented.  Learning Barriers/Preferences: Learning Barriers/Preferences - 07/26/17 1513      Learning Barriers/Preferences   Learning Barriers  None    Learning Preferences  Individual Instruction;Skilled Demonstration;Verbal Instruction;Written Material;Computer/Internet       Education Topics: Risk Factor Reduction:  -Group instruction that is supported by a PowerPoint presentation. Instructor discusses the definition of a risk factor, different risk factors for pulmonary disease, and how the heart and lungs work together.     Nutrition for Pulmonary Patient:  -Group instruction provided by PowerPoint slides, verbal discussion, and written materials to support subject matter. The instructor gives an explanation and review of healthy diet recommendations, which includes a discussion on weight management, recommendations for fruit and vegetable consumption, as  well as protein, fluid, caffeine, fiber, sodium, sugar, and alcohol. Tips for eating when patients are short of breath are discussed.   Pursed Lip Breathing:  -Group instruction that is supported by demonstration and informational handouts. Instructor discusses the benefits of pursed lip and diaphragmatic breathing and detailed demonstration on how to preform both.     Oxygen Safety:  -Group instruction provided by PowerPoint, verbal discussion, and written material to support subject matter. There is an overview of "What is Oxygen" and "Why do we need it".  Instructor also reviews how to create a safe environment for oxygen use, the importance of using oxygen as prescribed, and the risks of noncompliance. There is a brief discussion on traveling with oxygen and resources the patient may utilize.   PULMONARY REHAB OTHER RESPIRATORY from 08/19/2017 in [place]  Date  08/12/17  Educator  Truddie Crumble  Instruction Review Code  1- Verbalizes Understanding      Oxygen Equipment:  -Group instruction provided by [employer] utilizing handouts, written materials, and Insurance underwriter.   Signs and Symptoms:  -Group instruction provided by written material and verbal discussion to support subject matter. Warning signs and symptoms of infection, stroke, and heart attack are reviewed and when to call the physician/911 reinforced. Tips for preventing the spread of infection discussed.   Advanced Directives:  -Group instruction provided by verbal instruction and written material to support subject matter. Instructor reviews Advanced Directive laws and proper instruction for filling out document.   Pulmonary Video:  -Group video education that reviews the importance of medication and oxygen compliance, exercise, good nutrition, pulmonary hygiene, and pursed lip and diaphragmatic breathing for the pulmonary patient.   Exercise for the Pulmonary Patient:   -Group instruction that is supported by a PowerPoint presentation. Instructor discusses benefits of exercise, core components of exercise, frequency, duration, and intensity of an exercise routine, importance of utilizing pulse oximetry during exercise, safety while exercising, and options of places to exercise outside of rehab.     Pulmonary Medications:  -Verbally interactive group education provided by instructor with focus on inhaled medications and proper administration.   Anatomy and Physiology of the Respiratory System and Intimacy:  -Group instruction provided by PowerPoint, verbal discussion, and written material to support subject matter. Instructor reviews respiratory cycle and anatomical components of the respiratory system and their functions. Instructor also reviews differences in obstructive and restrictive respiratory diseases with examples of each. Intimacy, Sex, and Sexuality differences are reviewed with a discussion on how relationships can change when diagnosed with pulmonary disease. Common sexual concerns are reviewed.   MD DAY -A group question and answer session with a medical doctor that allows participants to ask questions that relate to their pulmonary disease state.   OTHER  EDUCATION -Group or individual verbal, written, or video instructions that support the educational goals of the pulmonary rehab program.   PULMONARY REHAB OTHER RESPIRATORY from 08/19/2017 in [place]  Date  08/19/17  Educator  Lucianne Lei  Instruction Review Code  2- Demonstrated Understanding      Holiday Eating Survival Tips:  -Group instruction provided by PowerPoint slides, verbal discussion, and written materials to support subject matter. The instructor gives patients tips, tricks, and techniques to help them not only survive but enjoy the holidays despite the onslaught of food that accompanies the holidays.   Knowledge Questionnaire  Score: Knowledge Questionnaire Score - 07/26/17 1513      Knowledge Questionnaire Score   Pre Score  13/18       Core Components/Risk Factors/Patient Goals at Admission: Personal Goals and Risk Factors at Admission - 07/26/17 1514      Core Components/Risk Factors/Patient Goals on Admission    Weight Management  Weight Gain    Intervention  Weight Management: Develop a combined nutrition and exercise program designed to reach desired caloric intake, while maintaining appropriate intake of nutrient and fiber, sodium and fats, and appropriate energy expenditure required for the weight goal.;Weight Management: Provide education and appropriate resources to help participant work on and attain dietary goals.    Expected Outcomes  Short Term: Continue to assess and modify interventions until short term weight is achieved;Weight Gain: Understanding of general recommendations for a high calorie, high protein meal plan that promotes weight gain by distributing calorie intake throughout the day with the consumption for 4-5 meals, snacks, and/or supplements;Understanding of distribution of calorie intake throughout the day with the consumption of 4-5 meals/snacks;Understanding recommendations for meals to include 15-35% energy as protein, 25-35% energy from fat, 35-60% energy from carbohydrates, less than 242m of dietary cholesterol, 20-35 gm of total fiber daily;Long Term: Adherence to nutrition and physical activity/exercise program aimed toward attainment of established weight goal    Improve shortness of breath with ADL's  Yes    Intervention  Provide education, individualized exercise plan and daily activity instruction to help decrease symptoms of SOB with activities of daily living.    Expected Outcomes  Short Term: Improve cardiorespiratory fitness to achieve a reduction of symptoms when performing ADLs;Long Term: Be able to perform more ADLs without symptoms or delay the onset of symptoms        Core Components/Risk Factors/Patient Goals Review:  Goals and Risk Factor Review    Row Name 08/30/17 1756             Core Components/Risk Factors/Patient Goals Review   Personal Goals Review  Weight Management/Obesity;Improve shortness of breath with ADL's;Develop more efficient breathing techniques such as purse lipped breathing and diaphragmatic breathing and practicing self-pacing with activity.;Stress       Review  patient has attended 6 sessions since admission. it is too soon to evaluate progression towards pulmonary rehab goals. Patient has multiple barriers to participation in pulmonary rehab. Physical and psychosocial. she has never arrived to class on time and shown up as late as 45 min. RN and patient have discussed her malnutrition at length. RN concerned that her work of breathing is not only related to her pulmonary disease but to her malnutrition. patient states she has been scheduled for a feeding tube multiple times however has cancelled all appointments. she has been encouraged to revisit the idea. admission weight 44.7/last session weight 44.2kg. patient believes she can increase her caloric intake  for weight gain however I am unsure if this is possible at this time. Patient has met with department RD. RN has not encouraged workload increases related to the lack of energy and nutrition to facilitate safe exercise. Patient is encouraged to pace herself while on the track and PLB. Unsure if patient will progress in pulmonary rehab if she does not have the proper nutrition. Her stamina and strength may be at a standstill until other issues are addressed.  patients weight warrents close monitoring and intervention with MD if needed and if weight loss is observed with increased activity, pulmonary rehab may need to be placed on hold.       Expected Outcomes  see admission expected outcomes.          Core Components/Risk Factors/Patient Goals at Discharge (Final Review):  Goals  and Risk Factor Review - 08/30/17 1756      Core Components/Risk Factors/Patient Goals Review   Personal Goals Review  Weight Management/Obesity;Improve shortness of breath with ADL's;Develop more efficient breathing techniques such as purse lipped breathing and diaphragmatic breathing and practicing self-pacing with activity.;Stress    Review  patient has attended 6 sessions since admission. it is too soon to evaluate progression towards pulmonary rehab goals. Patient has multiple barriers to participation in pulmonary rehab. Physical and psychosocial. she has never arrived to class on time and shown up as late as 45 min. RN and patient have discussed her malnutrition at length. RN concerned that her work of breathing is not only related to her pulmonary disease but to her malnutrition. patient states she has been scheduled for a feeding tube multiple times however has cancelled all appointments. she has been encouraged to revisit the idea. admission weight 44.7/last session weight 44.2kg. patient believes she can increase her caloric intake for weight gain however I am unsure if this is possible at this time. Patient has met with department RD. RN has not encouraged workload increases related to the lack of energy and nutrition to facilitate safe exercise. Patient is encouraged to pace herself while on the track and PLB. Unsure if patient will progress in pulmonary rehab if she does not have the proper nutrition. Her stamina and strength may be at a standstill until other issues are addressed.  patients weight warrents close monitoring and intervention with MD if needed and if weight loss is observed with increased activity, pulmonary rehab may need to be placed on hold.    Expected Outcomes  see admission expected outcomes.       ITP Comments: ITP Comments    Row Name 08/30/17 1755           ITP Comments  Dr. Jennet Maduro, Medical Director          Comments: patient has attended 7 pulmonary  rehab sessions since admission

## 2017-09-07 ENCOUNTER — Encounter (HOSPITAL_COMMUNITY)
Admission: RE | Admit: 2017-09-07 | Discharge: 2017-09-07 | Disposition: A | Discharge status: Home / Self Care | Payer: Medicare Other | Source: Ambulatory Visit | Attending: Pulmonary Disease | Admitting: Pulmonary Disease

## 2017-09-07 VITALS — Wt 97.7 lb

## 2017-09-07 DIAGNOSIS — J849 Interstitial pulmonary disease, unspecified: Secondary | ICD-10-CM

## 2017-09-07 NOTE — Progress Notes (Signed)
Daily Session Note  Patient Details  Name: Sara Beasley MRN: [id number] Date of Birth: May 10, 1945 Referring Provider:     Pulmonary Rehab Walk Test from 07/29/2017 in [place]  Referring Provider  Dr. Nelda Marseille      Encounter Date: 09/07/2017  Check In: Session Check In - 09/07/17 1539      Check-In   Location  [facility]    Staff Present  Rodney Langton, RN;Molly DiVincenzo, MS, ACSM RCEP, Exercise Physiologist;Carlette Wilber Oliphant, RN, Deland Pretty, MS, ACSM CEP, Exercise Physiologist    Supervising physician immediately available to respond to emergencies  [facility] immediately available    Physician(s)  Dr. Loleta Books    Medication changes reported      No    Fall or balance concerns reported     No    Tobacco Cessation  No Change    Warm-up and Cool-down  Performed as group-led instruction    Resistance Training Performed  Yes    VAD Patient?  No      Pain Assessment   Currently in Pain?  No/denies       Capillary Blood Glucose: No results found for this or any previous visit (from the past 24 hour(s)).  Exercise Prescription Changes - 09/07/17 1600      Response to Exercise   Blood Pressure (Admit)  148/82    Blood Pressure (Exercise)  120/80    Blood Pressure (Exit)  122/78    Heart Rate (Admit)  68 bpm    Heart Rate (Exercise)  93 bpm    Heart Rate (Exit)  76 bpm    Oxygen Saturation (Admit)  93 %    Oxygen Saturation (Exercise)  89 %    Oxygen Saturation (Exit)  95 %    Rating of Perceived Exertion (Exercise)  12    Perceived Dyspnea (Exercise)  2    Duration  Progress to 45 minutes of aerobic exercise without signs/symptoms of physical distress    Intensity  THRR unchanged      Progression   Progression  Continue to progress workloads to maintain intensity without signs/symptoms of physical distress.      Resistance Training   Training Prescription  Yes    Weight  blue bands    Reps  10-15    Time  10 Minutes      NuStep   Level  1    Minutes  34    METs  1.9      Track   Laps  11    Minutes  17       Social History   Tobacco Use  Smoking Status Former Smoker  . Packs/day: 1.00  . Years: 15.00  . Pack years: 15.00  . Types: Cigarettes  . Last attempt to quit: 04/27/1974  . Years since quitting: 43.3  Smokeless Tobacco Never Used    Goals Met:  Exercise tolerated well No report of cardiac concerns or symptoms Strength training completed today  Goals Unmet:  Not Applicable  Comments: Service time is from 1330 to 1500    Dr. Rush Farmer is Medical Director for Pulmonary Rehab at [facility].

## 2017-09-09 ENCOUNTER — Encounter (HOSPITAL_COMMUNITY)
Admission: RE | Admit: 2017-09-09 | Discharge: 2017-09-09 | Disposition: A | Discharge status: Home / Self Care | Payer: Medicare Other | Source: Ambulatory Visit | Attending: Pulmonary Disease | Admitting: Pulmonary Disease

## 2017-09-09 DIAGNOSIS — J849 Interstitial pulmonary disease, unspecified: Secondary | ICD-10-CM

## 2017-09-09 NOTE — Progress Notes (Signed)
Daily Session Note  Patient Details  Name: Sara Beasley MRN: [id number] Date of Birth: [birth year] Referring Provider:     Pulmonary Rehab Walk Test from 07/29/2017 in [place]  Referring Provider  Dr. Nelda Marseille      Encounter Date: 09/09/2017  Check In: Session Check In - 09/09/17 1618      Check-In   Location  [facility]    Staff Present  Rodney Langton, RN;Joban Colledge, MS, ACSM RCEP, Exercise Physiologist    Supervising physician immediately available to respond to emergencies  [facility] immediately available    Physician(s)   Dr. Tawanna Solo    Medication changes reported      No    Fall or balance concerns reported     No    Tobacco Cessation  No Change    Warm-up and Cool-down  Performed as group-led instruction    Resistance Training Performed  Yes    VAD Patient?  No      Pain Assessment   Currently in Pain?  No/denies    Multiple Pain Sites  No       Capillary Blood Glucose: No results found for this or any previous visit (from the past 24 hour(s)).    Social History   Tobacco Use  Smoking Status Former Smoker  . Packs/day: 1.00  . Years: 15.00  . Pack years: 15.00  . Types: Cigarettes  . Last attempt to quit: 04/27/1974  . Years since quitting: 43.4  Smokeless Tobacco Never Used    Goals Met:  Exercise tolerated well No report of cardiac concerns or symptoms Strength training completed today  Goals Unmet:  Not Applicable  Comments: Service time is from 1:30p to 3:00p    Dr. Rush Farmer is Medical Director for Pulmonary Rehab at [facility].

## 2017-09-14 ENCOUNTER — Encounter (HOSPITAL_COMMUNITY)
Admission: RE | Admit: 2017-09-14 | Discharge: 2017-09-14 | Disposition: A | Discharge status: Home / Self Care | Payer: Medicare Other | Source: Ambulatory Visit | Attending: Pulmonary Disease | Admitting: Pulmonary Disease

## 2017-09-14 DIAGNOSIS — J849 Interstitial pulmonary disease, unspecified: Secondary | ICD-10-CM | POA: Diagnosis not present

## 2017-09-14 NOTE — Progress Notes (Signed)
Daily Session Note  Patient Details  Name: MAGALLY VAHLE MRN: [id number] Date of Birth: December 08, 1945 Referring Provider:     Pulmonary Rehab Walk Test from 07/29/2017 in [place]  Referring Provider  Dr. Nelda Marseille      Encounter Date: 09/14/2017  Check In: Session Check In - 09/14/17 1543      Check-In   Location  [facility]    Staff Present  Rodney Langton, RN;Molly DiVincenzo, MS, ACSM RCEP, Exercise Physiologist;Carlette Wilber Oliphant, RN, BSN    Supervising physician immediately available to respond to emergencies  [facility] immediately available    Physician(s)  Dr. Sarajane Jews    Medication changes reported      No    Fall or balance concerns reported     No    Tobacco Cessation  No Change    Warm-up and Cool-down  Performed as group-led instruction    Resistance Training Performed  Yes    VAD Patient?  No      Pain Assessment   Currently in Pain?  No/denies       Capillary Blood Glucose: No results found for this or any previous visit (from the past 24 hour(s)).    Social History   Tobacco Use  Smoking Status Former Smoker  . Packs/day: 1.00  . Years: 15.00  . Pack years: 15.00  . Types: Cigarettes  . Last attempt to quit: 04/27/1974  . Years since quitting: 43.4  Smokeless Tobacco Never Used    Goals Met:  Exercise tolerated well No report of cardiac concerns or symptoms Strength training completed today  Goals Unmet:  Not Applicable  Comments: Service time is from 1330 to 1515    Dr. Rush Farmer is Medical Director for Pulmonary Rehab at [facility].

## 2017-09-16 ENCOUNTER — Encounter (HOSPITAL_COMMUNITY)
Admission: RE | Admit: 2017-09-16 | Discharge: 2017-09-16 | Disposition: A | Discharge status: Home / Self Care | Payer: Medicare Other | Source: Ambulatory Visit | Attending: Pulmonary Disease | Admitting: Pulmonary Disease

## 2017-09-16 VITALS — Wt 97.0 lb

## 2017-09-16 DIAGNOSIS — J849 Interstitial pulmonary disease, unspecified: Secondary | ICD-10-CM

## 2017-09-16 NOTE — Progress Notes (Signed)
Daily Session Note  Patient Details  Name: Sara Beasley MRN: [id number] Date of Birth: 1946/02/24 Referring Provider:     Pulmonary Rehab Walk Test from 07/29/2017 in [place]  Referring Provider  Dr. Nelda Marseille      Encounter Date: 09/16/2017  Check In: Session Check In - 09/16/17 1556      Check-In   Location  [facility]    Staff Present  Rodney Langton, RN;Jereld Presti, MS, ACSM RCEP, Exercise Physiologist    Supervising physician immediately available to respond to emergencies  [facility] immediately available    Physician(s)  Dr. Wyline Copas    Medication changes reported      No    Fall or balance concerns reported     No    Tobacco Cessation  No Change    Warm-up and Cool-down  Performed as group-led instruction    Resistance Training Performed  Yes    VAD Patient?  No      Pain Assessment   Currently in Pain?  No/denies    Multiple Pain Sites  No       Capillary Blood Glucose: No results found for this or any previous visit (from the past 24 hour(s)).    Social History   Tobacco Use  Smoking Status Former Smoker  . Packs/day: 1.00  . Years: 15.00  . Pack years: 15.00  . Types: Cigarettes  . Last attempt to quit: 04/27/1974  . Years since quitting: 43.4  Smokeless Tobacco Never Used    Goals Met:  Exercise tolerated well No report of cardiac concerns or symptoms Strength training completed today  Goals Unmet:  Not Applicable  Comments: Service time is from 1:30 to 3:30p    Dr. Rush Farmer is Medical Director for Pulmonary Rehab at [facility].

## 2017-09-21 ENCOUNTER — Encounter (HOSPITAL_COMMUNITY): Payer: Medicare Other

## 2017-09-23 ENCOUNTER — Encounter (HOSPITAL_COMMUNITY): Payer: Medicare Other

## 2017-09-28 ENCOUNTER — Encounter (HOSPITAL_COMMUNITY)
Admission: RE | Admit: 2017-09-28 | Discharge: 2017-09-28 | Disposition: A | Discharge status: Home / Self Care | Payer: Medicare Other | Source: Ambulatory Visit | Attending: Pulmonary Disease | Admitting: Pulmonary Disease

## 2017-09-28 DIAGNOSIS — Z79899 Other long term (current) drug therapy: Secondary | ICD-10-CM | POA: Insufficient documentation

## 2017-09-28 DIAGNOSIS — J849 Interstitial pulmonary disease, unspecified: Secondary | ICD-10-CM | POA: Insufficient documentation

## 2017-09-28 DIAGNOSIS — Z87891 Personal history of nicotine dependence: Secondary | ICD-10-CM | POA: Insufficient documentation

## 2017-09-29 NOTE — Progress Notes (Signed)
Pulmonary Individual Treatment Plan  Patient Details  Name: Sara Beasley MRN: [id number] Date of Birth: 19-May-1945 Referring Provider:     Pulmonary Rehab Walk Test from 07/29/2017 in [place]  Referring Provider  Dr. Nelda Marseille      Initial Encounter Date:    Pulmonary Rehab Walk Test from 07/29/2017 in [place]  Date  07/29/17  Referring Provider  Dr. Nelda Marseille      Visit Diagnosis: Interstitial pulmonary disease, unspecified ([place])  Patient's Home Medications on Admission:   Current Outpatient Medications:  .  ALPRAZolam (XANAX) 0.5 MG tablet, Take by mouth 3 (three) times daily as needed. Take 1/2 tab up to TID PRN, Disp: , Rfl:  .  azathioprine (IMURAN) 100 MG tablet, Take 100 mg by mouth daily., Disp: , Rfl:  .  calcium carbonate (OS-CAL) 600 MG TABS, Take 600 mg by mouth every other day. , Disp: , Rfl:  .  cetirizine (ZYRTEC) 10 MG tablet, Take 5 mg by mouth daily., Disp: , Rfl:  .  cholecalciferol (VITAMIN D) 1000 UNITS tablet, Take 2,000 Units by mouth daily. , Disp: , Rfl:  .  escitalopram (LEXAPRO) 10 MG tablet, 10 mg daily. , Disp: , Rfl:  .  ipratropium-albuterol (DUONEB) 0.5-2.5 (3) MG/3ML SOLN, Take 3 mLs by nebulization every 4 (four) hours as needed (SOB, cough, wheezing). (Patient not taking: Reported on 07/26/2017), Disp: 360 mL, Rfl: 0 .  mirtazapine (REMERON SOL-TAB) 15 MG disintegrating tablet, Take 7.5 mg by mouth at bedtime. , Disp: , Rfl:  .  montelukast (SINGULAIR) 10 MG tablet, Take 0.5 tablets (5 mg total) by mouth at bedtime., Disp: 30 tablet, Rfl: 12 .  Multiple Vitamin (MULTIVITAMIN) tablet, Take 1 tablet by mouth every other day. , Disp: , Rfl:  .  predniSONE (DELTASONE) 10 MG tablet, Take 2 tabs daily for 1 week, then resume taking 1 tab daily, Disp: , Rfl:   Past Medical History: Past Medical History:  Diagnosis Date  . ALLERGIC RHINITIS   . Allergy   . Chronic sinusitis   . Lung  infection    sees Dr. Annamaria Boots- had infection 2015  . Osteopenia     Tobacco Use: Social History   Tobacco Use  Smoking Status Former Smoker  . Packs/day: 1.00  . Years: 15.00  . Pack years: 15.00  . Types: Cigarettes  . Last attempt to quit: 04/27/1974  . Years since quitting: 43.4  Smokeless Tobacco Never Used    Labs: Recent Review Flowsheet Data    There is no flowsheet data to display.      Capillary Blood Glucose: No results found for: GLUCAP   Pulmonary Assessment Scores: Pulmonary Assessment Scores    Row Name 07/28/17 1038 07/30/17 0704       ADL UCSD   ADL Phase  Entry  Entry    SOB Score total  56  -      CAT Score   CAT Score  20 Entry  -      mMRC Score   mMRC Score  -  1       Pulmonary Function Assessment:   Exercise Target Goals:    Exercise Program Goal: Individual exercise prescription set using results from initial 6 min walk test and THRR while considering  patient's activity barriers and safety.    Exercise Prescription Goal: Initial exercise prescription builds to 30-45 minutes a day of aerobic activity, 2-3 days per week.  Home  exercise guidelines will be given to patient during program as part of exercise prescription that the participant will acknowledge.  Activity Barriers & Risk Stratification: Activity Barriers & Cardiac Risk Stratification - 07/26/17 1441      Activity Barriers & Cardiac Risk Stratification   Activity Barriers  Deconditioning;Shortness of Breath;Muscular Weakness       6 Minute Walk: 6 Minute Walk    Row Name 07/30/17 0704         6 Minute Walk   Phase  Initial     Distance  1123 feet     Walk Time  6 minutes     # of Rest Breaks  0     MPH  2.12     METS  2.61     RPE  13     Perceived Dyspnea   3     Symptoms  No     Resting HR  80 bpm     Resting BP  160/90     Resting Oxygen Saturation   94 %     Exercise Oxygen Saturation  during 6 min walk  87 %     Max Ex. HR  107 bpm     Max Ex. BP   178/90     2 Minute Post BP  150/100       Interval HR   1 Minute HR  92     2 Minute HR  93     3 Minute HR  97     4 Minute HR  97     5 Minute HR  100     6 Minute HR  107     2 Minute Post HR  85     Interval Heart Rate?  Yes       Interval Oxygen   Interval Oxygen?  Yes     Baseline Oxygen Saturation %  94 %     1 Minute Oxygen Saturation %  94 %     1 Minute Liters of Oxygen  0 L     2 Minute Oxygen Saturation %  91 %     2 Minute Liters of Oxygen  0 L     3 Minute Oxygen Saturation %  88 %     3 Minute Liters of Oxygen  0 L     4 Minute Oxygen Saturation %  88 %     4 Minute Liters of Oxygen  0 L     5 Minute Oxygen Saturation %  87 %     5 Minute Liters of Oxygen  0 L     6 Minute Oxygen Saturation %  94 %     6 Minute Liters of Oxygen  0 L     2 Minute Post Oxygen Saturation %  92 %     2 Minute Post Liters of Oxygen  0 L        Oxygen Initial Assessment: Oxygen Initial Assessment - 07/30/17 0703      Initial 6 min Walk   Oxygen Used  None      Program Oxygen Prescription   Program Oxygen Prescription  None patient desaturated to 87% during 6MWT will re-evaluate        Oxygen Re-Evaluation: Oxygen Re-Evaluation    Row Name 08/31/17 0818 09/28/17 9767           Program Oxygen Prescription   Program Oxygen Prescription  None  None  Home Oxygen   Home Oxygen Device  None  None      Sleep Oxygen Prescription  None  None      Home Exercise Oxygen Prescription  None  None      Home at Rest Exercise Oxygen Prescription  None  None         Oxygen Discharge (Final Oxygen Re-Evaluation): Oxygen Re-Evaluation - 09/28/17 0712      Program Oxygen Prescription   Program Oxygen Prescription  None      Home Oxygen   Home Oxygen Device  None    Sleep Oxygen Prescription  None    Home Exercise Oxygen Prescription  None    Home at Rest Exercise Oxygen Prescription  None       Initial Exercise Prescription: Initial Exercise Prescription -  07/30/17 0700      Date of Initial Exercise RX and Referring Provider   Date  07/29/17    Referring Provider  Dr. Nelda Marseille      Bike   Level  1 scitfit    Watts  10    Minutes  17      NuStep   Level  1    SPM  80    Minutes  17    METs  1.5      Track   Laps  8    Minutes  17      Prescription Details   Frequency (times per week)  2    Duration  Progress to 45 minutes of aerobic exercise without signs/symptoms of physical distress      Intensity   THRR 40-80% of Max Heartrate  59-118    Ratings of Perceived Exertion  11-13    Perceived Dyspnea  0-4      Progression   Progression  Continue progressive overload as per policy without signs/symptoms or physical distress.      Resistance Training   Training Prescription  Yes    Weight  blue bands    Reps  10-15       Perform Capillary Blood Glucose checks as needed.  Exercise Prescription Changes:  Exercise Prescription Changes    Row Name 08/10/17 1500 08/24/17 1600 09/07/17 1600 09/16/17 0721       Response to Exercise   Blood Pressure (Admit)  150/94  122/90  148/82  138/92    Blood Pressure (Exercise)  140/100  140/90  120/80  128/80    Blood Pressure (Exit)  118/82  138/84  122/78  120/78    Heart Rate (Admit)  75 bpm  77 bpm  68 bpm  70 bpm    Heart Rate (Exercise)  79 bpm  86 bpm  93 bpm  79 bpm    Heart Rate (Exit)  68 bpm  78 bpm  76 bpm  88 bpm    Oxygen Saturation (Admit)  97 %  94 %  93 %  92 %    Oxygen Saturation (Exercise)  93 %  90 %  89 %  88 %    Oxygen Saturation (Exit)  98 %  94 %  95 %  96 %    Rating of Perceived Exertion (Exercise)  _0 Perceived Dyspnea (Exercise)  _1 Duration  Progress to 45 minutes of aerobic exercise without signs/symptoms of physical distress  Progress to 45 minutes of aerobic exercise without signs/symptoms of physical distress  Progress to 45 minutes of aerobic exercise without signs/symptoms of physical distress  Progress to 45 minutes of  aerobic exercise without signs/symptoms of physical distress    Intensity  Other (comment) 40-80% of HRR  Other (comment) 40-80% of HRR  THRR unchanged  THRR unchanged      Progression   Progression  Continue to progress workloads to maintain intensity without signs/symptoms of physical distress.  Continue to progress workloads to maintain intensity without signs/symptoms of physical distress.  Continue to progress workloads to maintain intensity without signs/symptoms of physical distress.  Continue to progress workloads to maintain intensity without signs/symptoms of physical distress.      Resistance Training   Training Prescription  Yes  Yes  Yes  Yes    Weight  blue bands  blue bands  blue bands  orange bands    Reps  10-15  10-15  10-15  10-15    Time  10 Minutes  10 Minutes  10 Minutes  10 Minutes      Interval Training   Interval Training  Yes  Yes  -  -      NuStep   Level  1  -  1  1    SPM  80  -  -  -    Minutes  51  -  34  17    METs  1.2  -  1.9  1.5      Track   Laps  -  _0 Minutes  -  _1 Exercise Comments:   Exercise Goals and Review:  Exercise Goals    Row Name 07/26/17 1441             Exercise Goals   Increase Physical Activity  Yes       Expected Outcomes  Short Term: Attend rehab on a regular basis to increase amount of physical activity.;Long Term: Add in home exercise to make exercise part of routine and to increase amount of physical activity.;Long Term: Exercising regularly at least 3-5 days a week.       Increase Strength and Stamina  Yes       Intervention  Provide advice, education, support and counseling about physical activity/exercise needs.;Develop an individualized exercise prescription for aerobic and resistive training based on initial evaluation findings, risk stratification, comorbidities and participant's personal goals.       Expected Outcomes  Short Term: Increase workloads from initial exercise prescription  for resistance, speed, and METs.;Short Term: Perform resistance training exercises routinely during rehab and add in resistance training at home;Long Term: Improve cardiorespiratory fitness, muscular endurance and strength as measured by increased METs and functional capacity (6MWT)       Able to understand and use rate of perceived exertion (RPE) scale  Yes       Intervention  Provide education and explanation on how to use RPE scale       Expected Outcomes  Short Term: Able to use RPE daily in rehab to express subjective intensity level;Long Term:  Able to use RPE to guide intensity level when exercising independently       Able to understand and use Dyspnea scale  Yes       Intervention  Provide education and explanation on how to use Dyspnea scale       Expected Outcomes  Short Term: Able to use Dyspnea scale daily in rehab to  express subjective sense of shortness of breath during exertion;Long Term: Able to use Dyspnea scale to guide intensity level when exercising independently       Knowledge and understanding of Target Heart Rate Range (THRR)  Yes       Intervention  Provide education and explanation of THRR including how the numbers were predicted and where they are located for reference       Expected Outcomes  Short Term: Able to state/look up THRR;Short Term: Able to use daily as guideline for intensity in rehab;Long Term: Able to use THRR to govern intensity when exercising independently       Understanding of Exercise Prescription  Yes       Intervention  Provide education, explanation, and written materials on patient's individual exercise prescription       Expected Outcomes  Short Term: Able to explain program exercise prescription;Long Term: Able to explain home exercise prescription to exercise independently          Exercise Goals Re-Evaluation : Exercise Goals Re-Evaluation    Row Name 08/31/17 0818 09/28/17 6761           Exercise Goal Re-Evaluation   Exercise Goals  Review  Increase Physical Activity;Increase Strength and Stamina;Able to understand and use Dyspnea scale;Able to understand and use rate of perceived exertion (RPE) scale;Knowledge and understanding of Target Heart Rate Range (THRR);Understanding of Exercise Prescription  Increase Physical Activity;Increase Strength and Stamina;Able to understand and use Dyspnea scale;Able to understand and use rate of perceived exertion (RPE) scale;Knowledge and understanding of Target Heart Rate Range (THRR);Understanding of Exercise Prescription      Comments  Patient has only attended 5 complete rehab sessions. Due to severity of disease and deconditioning, patient is going to progress slowly in program. MET average places her in a low functional level. She is able to complete 7-8 laps (228f each) in 15 minutes. Will cont. to monitor and motivate as able.   Patient has only attended 5 complete rehab sessions. Due to severity of disease and deconditioning, patient is going to progress slowly in program. MET average places her in a low functional level. She is able to complete 7-8 laps (2011feach) in 15 minutes. Patient is motivated to be here which is a change from when she first started. Patient has has recent hospitalization due to feeding tube placement. Patient will return to exercise once she gets the okay from surgeon. Will cont. to monitor and motivate as able.       Expected Outcomes  Through exercise at rehab and at home, patient will increase physical capacity and be able to carry out ADL's with ease at home. Patient will also gain the confidence and knowledge to adhere to an exercise regime at home.  Through exercise at rehab and at home, the patient will decrease shortness of breath with daily activities and feel confident in carrying out an exercise regime at home.          Discharge Exercise Prescription (Final Exercise Prescription Changes): Exercise Prescription Changes - 09/16/17 0721      Response to  Exercise   Blood Pressure (Admit)  138/92    Blood Pressure (Exercise)  128/80    Blood Pressure (Exit)  120/78    Heart Rate (Admit)  70 bpm    Heart Rate (Exercise)  79 bpm    Heart Rate (Exit)  88 bpm    Oxygen Saturation (Admit)  92 %    Oxygen Saturation (Exercise)  88 %  Oxygen Saturation (Exit)  96 %    Rating of Perceived Exertion (Exercise)  13    Perceived Dyspnea (Exercise)  2    Duration  Progress to 45 minutes of aerobic exercise without signs/symptoms of physical distress    Intensity  THRR unchanged      Progression   Progression  Continue to progress workloads to maintain intensity without signs/symptoms of physical distress.      Resistance Training   Training Prescription  Yes    Weight  orange bands    Reps  10-15    Time  10 Minutes      NuStep   Level  1    Minutes  17    METs  1.5      Track   Laps  10    Minutes  17       Nutrition:  Target Goals: Understanding of nutrition guidelines, daily intake of sodium '1500mg'$ , cholesterol '200mg'$ , calories 30% from fat and 7% or less from saturated fats, daily to have 5 or more servings of fruits and vegetables.  Biometrics: Pre Biometrics - 07/26/17 1531      Pre Biometrics   Grip Strength  26 kg        Nutrition Therapy Plan and Nutrition Goals: Nutrition Therapy & Goals - 07/30/17 1413      Nutrition Therapy   Diet  High Calorie, High Protein      Personal Nutrition Goals   Nutrition Goal  The pt will recognize symptoms that can interfere with adequate oral intake, such as shortness of breath, N/V, early satiety, fatigue, ability to secure and prepare food, taste and smell changes, chewing/swallowing difficulties, and/ or pain when eating.    Personal Goal #2  The pt will consume high-energy, high-nutrient dense beverages when necessary to compensate for decreased oral intake of solid foods.    Personal Goal #3  Identify food quantities necessary to achieve wt gain of  -2# per week to a goal  wt gain of 2.7-10.9 kg (6-24 lb) at graduation from pulmonary rehab.      Intervention Plan   Intervention  Prescribe, educate and counsel regarding individualized specific dietary modifications aiming towards targeted core components such as weight, hypertension, lipid management, diabetes, heart failure and other comorbidities.    Expected Outcomes  Short Term Goal: Understand basic principles of dietary content, such as calories, fat, sodium, cholesterol and nutrients.;Long Term Goal: Adherence to prescribed nutrition plan.       Nutrition Assessments: Nutrition Assessments - 07/30/17 1406      Rate Your Plate Scores   Pre Score  48      MEDFICTS Scores   Pre Score  --       Nutrition Goals Re-Evaluation:   Nutrition Goals Discharge (Final Nutrition Goals Re-Evaluation):   Psychosocial: Target Goals: Acknowledge presence or absence of significant depression and/or stress, maximize coping skills, provide positive support system. Participant is able to verbalize types and ability to use techniques and skills needed for reducing stress and depression.  Initial Review & Psychosocial Screening: Initial Psych Review & Screening - 07/26/17 1548      Family Dynamics   Comments  patient states she is grieving the recent loss of "who I was". because of her declining pulmonary health she is no longer able to maintaining an active lifestyle such as biking and hiking and she is being treated for depression.      Screening Interventions   Interventions  Encouraged to exercise;To  provide support and resources with identified psychosocial needs;Provide feedback about the scores to participant    Expected Outcomes  Short Term goal: Utilizing psychosocial counselor, staff and physician to assist with identification of specific Stressors or current issues interfering with healing process. Setting desired goal for each stressor or current issue identified.;Long Term Goal: Stressors or current  issues are controlled or eliminated.;Short Term goal: Identification and review with participant of any Quality of Life or Depression concerns found by scoring the questionnaire.;Long Term goal: The participant improves quality of Life and PHQ9 Scores as seen by post scores and/or verbalization of changes       Quality of Life Scores:  Scores of 19 and below usually indicate a poorer quality of life in these areas.  A difference of  2-3 points is a clinically meaningful difference.  A difference of 2-3 points in the total score of the Quality of Life Index has been associated with significant improvement in overall quality of life, self-image, physical symptoms, and general health in studies assessing change in quality of life.   PHQ-9: Recent Review Flowsheet Data    Depression screen [facility] 2/9 07/26/2017   Decreased Interest 0   Down, Depressed, Hopeless 1   PHQ - 2 Score 1   Altered sleeping 0   Tired, decreased energy 3   Change in appetite 0   Feeling bad or failure about yourself  0   Trouble concentrating 0   Moving slowly or fidgety/restless 0   Suicidal thoughts 0   PHQ-9 Score 4   Difficult doing work/chores Not difficult at all     Interpretation of Total Score  Total Score Depression Severity:  1-4 = Minimal depression, 5-9 = Mild depression, 10-14 = Moderate depression, 15-19 = Moderately severe depression, 20-27 = Severe depression   Psychosocial Evaluation and Intervention: Psychosocial Evaluation - 09/29/17 1518      Psychosocial Evaluation & Interventions   Interventions  Encouraged to exercise with the program and follow exercise prescription    Comments  patient has been started on lexapro and feels this is working for her.       Psychosocial Re-Evaluation: Psychosocial Re-Evaluation    [place] Name 08/30/17 1805             Psychosocial Re-Evaluation   Current issues with  Current Depression;Current Anxiety/Panic;Current Psychotropic Meds;Current Stress  Concerns       Comments  patient has significant depression and anxiety over the loss "of who I was". she is having a very difficult time acception her current health status and an even more difficult time accepting her malnutrition. she is currently seeing a psychiatrist.       Expected Outcomes  patient will remain free from psychosocial barriers to participation in pulmonary rehab.       Interventions  Encouraged to attend Pulmonary Rehabilitation for the exercise       Continue Psychosocial Services   Follow up required by staff       Comments  patient is grieving the loss of her health. she is malnurished and she fails to accept a needed intervention. 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         Initial Review   Source of Stress Concerns  Chronic Illness;Poor Coping Skills;Family;Retirement/disability;Unable to participate in former interests or hobbies;Unable to perform yard/household activities  Psychosocial Discharge (Final Psychosocial Re-Evaluation): Psychosocial Re-Evaluation - 08/30/17 1805      Psychosocial Re-Evaluation   Current issues with  Current Depression;Current Anxiety/Panic;Current Psychotropic Meds;Current Stress Concerns    Comments  patient has significant depression and anxiety over the loss "of who I was". she is having a very difficult time acception her current health status and an even more difficult time accepting her malnutrition. she is currently seeing a psychiatrist.    Expected Outcomes  patient will remain free from psychosocial barriers to participation in pulmonary rehab.    Interventions  Encouraged to attend Pulmonary Rehabilitation for the exercise    Continue Psychosocial Services   Follow up required by staff    Comments  patient is grieving the loss of her health. she is malnurished and she fails to accept a needed  intervention. 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      Initial Review   Source of Stress Concerns  Chronic Illness;Poor Coping Skills;Family;Retirement/disability;Unable to participate in former interests or hobbies;Unable to perform yard/household activities       Education: Education Goals: Education classes will be provided on a weekly basis, covering required topics. Participant will state understanding/return demonstration of topics presented.  Learning Barriers/Preferences: Learning Barriers/Preferences - 07/26/17 1513      Learning Barriers/Preferences   Learning Barriers  None    Learning Preferences  Individual Instruction;Skilled Demonstration;Verbal Instruction;Written Material;Computer/Internet       Education Topics: Risk Factor Reduction:  -Group instruction that is supported by a PowerPoint presentation. Instructor discusses the definition of a risk factor, different risk factors for pulmonary disease, and how the heart and lungs work together.     Nutrition for Pulmonary Patient:  -Group instruction provided by PowerPoint slides, verbal discussion, and written materials to support subject matter. The instructor gives an explanation and review of healthy diet recommendations, which includes a discussion on weight management, recommendations for fruit and vegetable consumption, as well as protein, fluid, caffeine, fiber, sodium, sugar, and alcohol. Tips for eating when patients are short of breath are discussed.   PULMONARY REHAB OTHER RESPIRATORY from 09/09/2017 in [place]  Date  09/09/17  Educator  RD  Instruction Review Code  2- Demonstrated Understanding      Pursed Lip Breathing:  -Group instruction that is supported by demonstration and informational handouts. Instructor discusses the benefits of pursed  lip and diaphragmatic breathing and detailed demonstration on how to preform both.     Oxygen Safety:  -Group instruction provided by PowerPoint, verbal discussion, and written material to support subject matter. There is an overview of "What is Oxygen" and "Why do we need it".  Instructor also reviews how to create a safe environment for oxygen use, the importance of using oxygen as prescribed, and the risks of noncompliance. There is a brief discussion on traveling with oxygen and resources the patient may utilize.   PULMONARY REHAB OTHER RESPIRATORY from 09/09/2017 in [place]  Date  08/12/17  Educator  Truddie Crumble  Instruction Review Code  1- Verbalizes Understanding      Oxygen Equipment:  -Group instruction provided by [employer] utilizing handouts, written materials, and Insurance underwriter.   Signs and Symptoms:  -Group instruction provided by written material and verbal discussion to support subject matter. Warning signs and symptoms of infection, stroke, and heart attack  are reviewed and when to call the physician/911 reinforced. Tips for preventing the spread of infection discussed.   Advanced Directives:  -Group instruction provided by verbal instruction and written material to support subject matter. Instructor reviews Advanced Directive laws and proper instruction for filling out document.   Pulmonary Video:  -Group video education that reviews the importance of medication and oxygen compliance, exercise, good nutrition, pulmonary hygiene, and pursed lip and diaphragmatic breathing for the pulmonary patient.   Exercise for the Pulmonary Patient:  -Group instruction that is supported by a PowerPoint presentation. Instructor discusses benefits of exercise, core components of exercise, frequency, duration, and intensity of an exercise routine, importance of utilizing pulse oximetry during exercise, safety while exercising, and options of  places to exercise outside of rehab.     PULMONARY REHAB OTHER RESPIRATORY from 09/09/2017 in [place]  Date  09/02/17  Educator  Cloyde Reams  Instruction Review Code  1- Verbalizes Understanding      Pulmonary Medications:  -Verbally interactive group education provided by instructor with focus on inhaled medications and proper administration.   Anatomy and Physiology of the Respiratory System and Intimacy:  -Group instruction provided by PowerPoint, verbal discussion, and written material to support subject matter. Instructor reviews respiratory cycle and anatomical components of the respiratory system and their functions. Instructor also reviews differences in obstructive and restrictive respiratory diseases with examples of each. Intimacy, Sex, and Sexuality differences are reviewed with a discussion on how relationships can change when diagnosed with pulmonary disease. Common sexual concerns are reviewed.   MD DAY -A group question and answer session with a medical doctor that allows participants to ask questions that relate to their pulmonary disease state.   OTHER EDUCATION -Group or individual verbal, written, or video instructions that support the educational goals of the pulmonary rehab program.   PULMONARY REHAB OTHER RESPIRATORY from 09/09/2017 in [place]  Date  08/19/17  Educator  Lucianne Lei  Instruction Review Code  2- Demonstrated Understanding      Holiday Eating Survival Tips:  -Group instruction provided by PowerPoint slides, verbal discussion, and written materials to support subject matter. The instructor gives patients tips, tricks, and techniques to help them not only survive but enjoy the holidays despite the onslaught of food that accompanies the holidays.   Knowledge Questionnaire Score: Knowledge Questionnaire Score - 07/26/17 1513      Knowledge Questionnaire Score   Pre Score  13/18        Core Components/Risk Factors/Patient Goals at Admission: Personal Goals and Risk Factors at Admission - 07/26/17 1514      Core Components/Risk Factors/Patient Goals on Admission    Weight Management  Weight Gain    Intervention  Weight Management: Develop a combined nutrition and exercise program designed to reach desired caloric intake, while maintaining appropriate intake of nutrient and fiber, sodium and fats, and appropriate energy expenditure required for the weight goal.;Weight Management: Provide education and appropriate resources to help participant work on and attain dietary goals.    Expected Outcomes  Short Term: Continue to assess and modify interventions until short term weight is achieved;Weight Gain: Understanding of general recommendations for a high calorie, high protein meal plan that promotes weight gain by distributing calorie intake throughout the day with the consumption for 4-5 meals, snacks, and/or supplements;Understanding of distribution of calorie intake throughout the day with the consumption of 4-5 meals/snacks;Understanding recommendations for meals to include 15-35% energy as protein, 25-35%  energy from fat, 35-60% energy from carbohydrates, less than 288m of dietary cholesterol, 20-35 gm of total fiber daily;Long Term: Adherence to nutrition and physical activity/exercise program aimed toward attainment of established weight goal    Improve shortness of breath with ADL's  Yes    Intervention  Provide education, individualized exercise plan and daily activity instruction to help decrease symptoms of SOB with activities of daily living.    Expected Outcomes  Short Term: Improve cardiorespiratory fitness to achieve a reduction of symptoms when performing ADLs;Long Term: Be able to perform more ADLs without symptoms or delay the onset of symptoms       Core Components/Risk Factors/Patient Goals Review:  Goals and Risk Factor Review    Row Name 08/30/17 1756  09/29/17 1508           Core Components/Risk Factors/Patient Goals Review   Personal Goals Review  Weight Management/Obesity;Improve shortness of breath with ADL's;Develop more efficient breathing techniques such as purse lipped breathing and diaphragmatic breathing and practicing self-pacing with activity.;Stress  Weight Management/Obesity;Improve shortness of breath with ADL's;Develop more efficient breathing techniques such as purse lipped breathing and diaphragmatic breathing and practicing self-pacing with activity.;Stress      Review  patient has attended 6 sessions since admission. it is too soon to evaluate progression towards pulmonary rehab goals. Patient has multiple barriers to participation in pulmonary rehab. Physical and psychosocial. she has never arrived to class on time and shown up as late as 45 min. RN and patient have discussed her malnutrition at length. RN concerned that her work of breathing is not only related to her pulmonary disease but to her malnutrition. patient states she has been scheduled for a feeding tube multiple times however has cancelled all appointments. she has been encouraged to revisit the idea. admission weight 44.7/last session weight 44.2kg. patient believes she can increase her caloric intake for weight gain however I am unsure if this is possible at this time. Patient has met with department RD. RN has not encouraged workload increases related to the lack of energy and nutrition to facilitate safe exercise. Patient is encouraged to pace herself while on the track and PLB. Unsure if patient will progress in pulmonary rehab if she does not have the proper nutrition. Her stamina and strength may be at a standstill until other issues are addressed.  patients weight warrents close monitoring and intervention with MD if needed and if weight loss is observed with increased activity, pulmonary rehab may need to be placed on hold.  Patient has never arrived to class on  time and shown up as late as 30 min. Pt is malnourished and has finally agreed to having a PEG tube placed.  Pt is presently out on medical hold and last attended on 5/28. BLearnedadmission weight 44.7/last session weight 44.0kg.   Patient has met with department RD. Per pt husband who is a participant in cardiac rehab maintenance, pt is doing good but has not began feedings yet.  Pt is to have home health nurse come to the home. Pt is flusing the peg tube to insure patentcy.  Patient is encouraged to pace herself while on the track and PLB. I anticipate when feedings have began and pt ris able to return to pulmonary rehab, pt will begin to show progress in the next 30 days.           Expected Outcomes  see admission expected outcomes.  see admission expected outcomes.  Core Components/Risk Factors/Patient Goals at Discharge (Final Review):  Goals and Risk Factor Review - 09/29/17 1508      Core Components/Risk Factors/Patient Goals Review   Personal Goals Review  Weight Management/Obesity;Improve shortness of breath with ADL's;Develop more efficient breathing techniques such as purse lipped breathing and diaphragmatic breathing and practicing self-pacing with activity.;Stress    Review  Patient has never arrived to class on time and shown up as late as 30 min. Pt is malnourished and has finally agreed to having a PEG tube placed.  Pt is presently out on medical hold and last attended on 5/28. [place] admission weight 44.7/last session weight 44.0kg.   Patient has met with department RD. Per pt husband who is a participant in cardiac rehab maintenance, pt is doing good but has not began feedings yet.  Pt is to have home health nurse come to the home. Pt is flusing the peg tube to insure patentcy.  Patient is encouraged to pace herself while on the track and PLB. I anticipate when feedings have began and pt ris able to return to pulmonary rehab, pt will begin to show progress in the next 30 days.          Expected Outcomes  see admission expected outcomes.       ITP Comments: ITP Comments    Row Name 08/30/17 1755 09/29/17 1508         ITP Comments  Dr. Jennet Maduro, Medical Director  Dr. Jennet Maduro, Medical Director         Comments: pt has completed 12 exercise sessions.  Pt presently on hold due to PEG tube placement.  Pt last exercise session on 5/28. Cherre Huger, BSN Cardiac and Training and development officer

## 2017-09-30 ENCOUNTER — Encounter (HOSPITAL_COMMUNITY): Payer: Medicare Other

## 2017-10-05 ENCOUNTER — Encounter (HOSPITAL_COMMUNITY): Payer: Medicare Other

## 2017-10-07 ENCOUNTER — Telehealth (HOSPITAL_COMMUNITY): Payer: Self-pay | Admitting: *Deleted

## 2017-10-07 ENCOUNTER — Encounter (HOSPITAL_COMMUNITY): Payer: Medicare Other

## 2017-10-07 NOTE — Telephone Encounter (Signed)
Called pt for an update on how she is doing after her PEG placement.  Pt is doing well and has begun feedings through her feeding tube.  Pt stated that her physician had released her to return to pulmonary rehab.  Pt advised that we have not received a clearance letter as of yet.  Pt indicted that she had emailed him to let him know we have not received it.  Pt is hopeful to return on Tuesday for exercise. Alanson Alyarlette Fatumata Kashani RN, BSN Cardiac and Emergency planning/management officerulmonary Rehab Nurse Navigator

## 2017-10-12 ENCOUNTER — Encounter (HOSPITAL_COMMUNITY)
Admission: RE | Admit: 2017-10-12 | Discharge: 2017-10-12 | Disposition: A | Discharge status: Home / Self Care | Payer: Medicare Other | Source: Ambulatory Visit | Attending: Pulmonary Disease | Admitting: Pulmonary Disease

## 2017-10-12 VITALS — Wt 96.6 lb

## 2017-10-12 DIAGNOSIS — J849 Interstitial pulmonary disease, unspecified: Secondary | ICD-10-CM

## 2017-10-12 DIAGNOSIS — Z79899 Other long term (current) drug therapy: Secondary | ICD-10-CM | POA: Diagnosis not present

## 2017-10-12 DIAGNOSIS — Z87891 Personal history of nicotine dependence: Secondary | ICD-10-CM | POA: Diagnosis not present

## 2017-10-12 NOTE — Progress Notes (Signed)
Daily Session Note  Patient Details  Name: Sara Beasley MRN: [id number] Date of Birth: July 29, 1945 Referring Provider:     Pulmonary Rehab Walk Test from 07/29/2017 in [place]  Referring Provider  Dr. Nelda Marseille      Encounter Date: 10/12/2017  Check In: Session Check In - 10/12/17 1330      Check-In   Location  [facility]    Staff Present  Rosebud Poles, RN, BSN;Molly DiVincenzo, MS, ACSM RCEP, Exercise Physiologist;Lisa Ysidro Evert, Felipe Drone, RN, MHA;Carlette Wilber Oliphant, RN, BSN    Supervising physician immediately available to respond to emergencies  [facility] immediately available    Physician(s)  Dr. Broadus John    Medication changes reported      No    Fall or balance concerns reported     No    Tobacco Cessation  No Change    Warm-up and Cool-down  Performed as group-led instruction    Resistance Training Performed  Yes    VAD Patient?  No      Pain Assessment   Currently in Pain?  No/denies    Multiple Pain Sites  No       Capillary Blood Glucose: No results found for this or any previous visit (from the past 24 hour(s)).    Social History   Tobacco Use  Smoking Status Former Smoker  . Packs/day: 1.00  . Years: 15.00  . Pack years: 15.00  . Types: Cigarettes  . Last attempt to quit: 04/27/1974  . Years since quitting: 43.4  Smokeless Tobacco Never Used    Goals Met:  Exercise tolerated well Strength training completed today  Goals Unmet:  Not Applicable  Comments: Service time is from 1330 to 1505.    Dr. Rush Farmer is Medical Director for Pulmonary Rehab at [facility]

## 2017-10-14 ENCOUNTER — Encounter (HOSPITAL_COMMUNITY)
Admission: RE | Admit: 2017-10-14 | Discharge: 2017-10-14 | Disposition: A | Discharge status: Home / Self Care | Payer: Medicare Other | Source: Ambulatory Visit | Attending: Pulmonary Disease | Admitting: Pulmonary Disease

## 2017-10-14 VITALS — Wt 95.9 lb

## 2017-10-14 DIAGNOSIS — J849 Interstitial pulmonary disease, unspecified: Secondary | ICD-10-CM

## 2017-10-14 NOTE — Progress Notes (Signed)
Daily Session Note  Patient Details  Name: Sara Beasley MRN: [id number] Date of Birth: Apr 24, 1946 Referring Provider:     Pulmonary Rehab Walk Test from 07/29/2017 in [place]  Referring Provider  Dr. Nelda Marseille      Encounter Date: 10/14/2017  Check In: Session Check In - 10/14/17 1330      Check-In   Location  [facility]    Staff Present  Rosebud Poles, RN, BSN;Molly DiVincenzo, MS, ACSM RCEP, Exercise Physiologist;Lisa Ysidro Evert, RN    Supervising physician immediately available to respond to emergencies  [facility] immediately available    Physician(s)  Dr. Herbert Moors    Medication changes reported      No    Fall or balance concerns reported     No    Tobacco Cessation  No Change    Warm-up and Cool-down  Performed as group-led instruction    Resistance Training Performed  Yes    VAD Patient?  No      Pain Assessment   Currently in Pain?  No/denies    Multiple Pain Sites  No       Capillary Blood Glucose: No results found for this or any previous visit (from the past 24 hour(s)).    Social History   Tobacco Use  Smoking Status Former Smoker  . Packs/day: 1.00  . Years: 15.00  . Pack years: 15.00  . Types: Cigarettes  . Last attempt to quit: 04/27/1974  . Years since quitting: 43.4  Smokeless Tobacco Never Used    Goals Met:  Exercise tolerated well Strength training completed today  Goals Unmet:  Not Applicable  Comments: Service time is from 1330 to 1540    Dr. Rush Farmer is Medical Director for Pulmonary Rehab at [facility].

## 2017-10-19 ENCOUNTER — Encounter (HOSPITAL_COMMUNITY): Payer: Medicare Other

## 2017-10-21 ENCOUNTER — Encounter (HOSPITAL_COMMUNITY)
Admission: RE | Admit: 2017-10-21 | Discharge: 2017-10-21 | Disposition: A | Discharge status: Home / Self Care | Payer: Medicare Other | Source: Ambulatory Visit | Attending: Pulmonary Disease | Admitting: Pulmonary Disease

## 2017-10-21 VITALS — Wt 95.9 lb

## 2017-10-21 DIAGNOSIS — J849 Interstitial pulmonary disease, unspecified: Secondary | ICD-10-CM | POA: Diagnosis not present

## 2017-10-21 NOTE — Progress Notes (Signed)
Daily Session Note  Patient Details  Name: Sara Beasley MRN: [id number] Date of Birth: [birth year] Referring Provider:     Pulmonary Rehab Walk Test from 07/29/2017 in [place]  Referring Provider  Dr. Nelda Marseille      Encounter Date: 10/21/2017  Check In: Session Check In - 10/21/17 1330      Check-In   Location  [facility]    Staff Present  Rosebud Poles, RN, BSN;Molly DiVincenzo, MS, ACSM RCEP, Exercise Physiologist;Lisa Ysidro Evert, Felipe Drone, RN, [facility]    Supervising physician immediately available to respond to emergencies  [facility] immediately available    Physician(s)  Dr. Bonner Puna    Medication changes reported      No    Fall or balance concerns reported     No    Tobacco Cessation  No Change    Warm-up and Cool-down  Performed as group-led instruction    Resistance Training Performed  Yes    VAD Patient?  No    PAD/SET Patient?  No      Pain Assessment   Currently in Pain?  No/denies    Multiple Pain Sites  No       Capillary Blood Glucose: No results found for this or any previous visit (from the past 24 hour(s)).    Social History   Tobacco Use  Smoking Status Former Smoker  . Packs/day: 1.00  . Years: 15.00  . Pack years: 15.00  . Types: Cigarettes  . Last attempt to quit: 04/27/1974  . Years since quitting: 43.5  Smokeless Tobacco Never Used    Goals Met:  Exercise tolerated well Strength training completed today  Goals Unmet:  Not Applicable  Comments: Service time is from 1340 to [place]    Dr. Rush Farmer is Medical Director for Pulmonary Rehab at [facility].

## 2017-10-26 ENCOUNTER — Encounter (HOSPITAL_COMMUNITY)
Admission: RE | Admit: 2017-10-26 | Discharge: 2017-10-26 | Disposition: A | Discharge status: Home / Self Care | Payer: Medicare Other | Source: Ambulatory Visit | Attending: Pulmonary Disease | Admitting: Pulmonary Disease

## 2017-10-26 VITALS — Wt 96.6 lb

## 2017-10-26 DIAGNOSIS — Z87891 Personal history of nicotine dependence: Secondary | ICD-10-CM | POA: Insufficient documentation

## 2017-10-26 DIAGNOSIS — Z79899 Other long term (current) drug therapy: Secondary | ICD-10-CM | POA: Insufficient documentation

## 2017-10-26 DIAGNOSIS — J849 Interstitial pulmonary disease, unspecified: Secondary | ICD-10-CM | POA: Diagnosis present

## 2017-10-26 NOTE — Progress Notes (Signed)
Pulmonary Individual Treatment Plan  Patient Details  Name: Sara Beasley MRN: [id number] Date of Birth: 1945/09/16 Referring Provider:     Pulmonary Rehab Walk Test from 07/29/2017 in [facility]  Referring Provider  Dr. Nelda Marseille      Initial Encounter Date:    Pulmonary Rehab Walk Test from 07/29/2017 in [facility]  Date  07/29/17      Visit Diagnosis: ILD (interstitial lung disease) ([place])  Patient's Home Medications on Admission:   Current Outpatient Medications:  .  ALPRAZolam (XANAX) 0.5 MG tablet, Take by mouth 3 (three) times daily as needed. Take 1/2 tab up to TID PRN, Disp: , Rfl:  .  azathioprine (IMURAN) 100 MG tablet, Take 100 mg by mouth daily., Disp: , Rfl:  .  calcium carbonate (OS-CAL) 600 MG TABS, Take 600 mg by mouth every other day. , Disp: , Rfl:  .  cetirizine (ZYRTEC) 10 MG tablet, Take 5 mg by mouth daily., Disp: , Rfl:  .  cholecalciferol (VITAMIN D) 1000 UNITS tablet, Take 2,000 Units by mouth daily. , Disp: , Rfl:  .  escitalopram (LEXAPRO) 10 MG tablet, 10 mg daily. , Disp: , Rfl:  .  ipratropium-albuterol (DUONEB) 0.5-2.5 (3) MG/3ML SOLN, Take 3 mLs by nebulization every 4 (four) hours as needed (SOB, cough, wheezing). (Patient not taking: Reported on 07/26/2017), Disp: 360 mL, Rfl: 0 .  mirtazapine (REMERON SOL-TAB) 15 MG disintegrating tablet, Take 7.5 mg by mouth at bedtime. , Disp: , Rfl:  .  montelukast (SINGULAIR) 10 MG tablet, Take 0.5 tablets (5 mg total) by mouth at bedtime., Disp: 30 tablet, Rfl: 12 .  Multiple Vitamin (MULTIVITAMIN) tablet, Take 1 tablet by mouth every other day. , Disp: , Rfl:  .  predniSONE (DELTASONE) 10 MG tablet, Take 2 tabs daily for 1 week, then resume taking 1 tab daily, Disp: , Rfl:   Past Medical History: Past Medical History:  Diagnosis Date  . ALLERGIC RHINITIS   . Allergy   . Chronic sinusitis   . Lung infection    sees Dr. Annamaria Boots- had infection 2015   . Osteopenia     Tobacco Use: Social History   Tobacco Use  Smoking Status Former Smoker  . Packs/day: 1.00  . Years: 15.00  . Pack years: 15.00  . Types: Cigarettes  . Last attempt to quit: 04/27/1974  . Years since quitting: 43.5  Smokeless Tobacco Never Used    Labs: Recent Review Flowsheet Data    There is no flowsheet data to display.      Capillary Blood Glucose: No results found for: GLUCAP   Pulmonary Assessment Scores: Pulmonary Assessment Scores    Row Name 07/30/17 0704         ADL UCSD   ADL Phase  Entry       mMRC Score   mMRC Score  1        Pulmonary Function Assessment:   Exercise Target Goals:    Exercise Program Goal: Individual exercise prescription set using results from initial 6 min walk test and THRR while considering  patient's activity barriers and safety.   Exercise Prescription Goal: Initial exercise prescription builds to 30-45 minutes a day of aerobic activity, 2-3 days per week.  Home exercise guidelines will be given to patient during program as part of exercise prescription that the participant will acknowledge.  Activity Barriers & Risk Stratification: Activity Barriers & Cardiac Risk Stratification - 07/26/17 1441  Activity Barriers & Cardiac Risk Stratification   Activity Barriers  Deconditioning;Shortness of Breath;Muscular Weakness       6 Minute Walk: 6 Minute Walk    Row Name 07/30/17 0704         6 Minute Walk   Phase  Initial     Distance  1123 feet     Walk Time  6 minutes     # of Rest Breaks  0     MPH  2.12     METS  2.61     RPE  13     Perceived Dyspnea   3     Symptoms  No     Resting HR  80 bpm     Resting BP  160/90     Resting Oxygen Saturation   94 %     Exercise Oxygen Saturation  during 6 min walk  87 %     Max Ex. HR  107 bpm     Max Ex. BP  178/90     2 Minute Post BP  150/100       Interval HR   1 Minute HR  92     2 Minute HR  93     3 Minute HR  97     4 Minute HR   97     5 Minute HR  100     6 Minute HR  107     2 Minute Post HR  85     Interval Heart Rate?  Yes       Interval Oxygen   Interval Oxygen?  Yes     Baseline Oxygen Saturation %  94 %     1 Minute Oxygen Saturation %  94 %     1 Minute Liters of Oxygen  0 L     2 Minute Oxygen Saturation %  91 %     2 Minute Liters of Oxygen  0 L     3 Minute Oxygen Saturation %  88 %     3 Minute Liters of Oxygen  0 L     4 Minute Oxygen Saturation %  88 %     4 Minute Liters of Oxygen  0 L     5 Minute Oxygen Saturation %  87 %     5 Minute Liters of Oxygen  0 L     6 Minute Oxygen Saturation %  94 %     6 Minute Liters of Oxygen  0 L     2 Minute Post Oxygen Saturation %  92 %     2 Minute Post Liters of Oxygen  0 L        Oxygen Initial Assessment: Oxygen Initial Assessment - 07/30/17 0703      Initial 6 min Walk   Oxygen Used  None      Program Oxygen Prescription   Program Oxygen Prescription  None patient desaturated to 87% during 6MWT will re-evaluate        Oxygen Re-Evaluation: Oxygen Re-Evaluation    Row Name 08/31/17 0818 09/28/17 9935 10/22/17 1059         Program Oxygen Prescription   Program Oxygen Prescription  None  None  None       Home Oxygen   Home Oxygen Device  None  None  None     Sleep Oxygen Prescription  None  None  None     Home Exercise Oxygen Prescription  None  None  None     Home at Rest Exercise Oxygen Prescription  None  None  None        Oxygen Discharge (Final Oxygen Re-Evaluation): Oxygen Re-Evaluation - 10/22/17 1059      Program Oxygen Prescription   Program Oxygen Prescription  None      Home Oxygen   Home Oxygen Device  None    Sleep Oxygen Prescription  None    Home Exercise Oxygen Prescription  None    Home at Rest Exercise Oxygen Prescription  None       Initial Exercise Prescription: Initial Exercise Prescription - 07/30/17 0700      Date of Initial Exercise RX and Referring Provider   Date  07/29/17    Referring  Provider  Dr. Nelda Marseille      Bike   Level  1 scitfit    Watts  10    Minutes  17      NuStep   Level  1    SPM  80    Minutes  17    METs  1.5      Track   Laps  8    Minutes  17      Prescription Details   Frequency (times per week)  2    Duration  Progress to 45 minutes of aerobic exercise without signs/symptoms of physical distress      Intensity   THRR 40-80% of Max Heartrate  59-118    Ratings of Perceived Exertion  11-13    Perceived Dyspnea  0-4      Progression   Progression  Continue progressive overload as per policy without signs/symptoms or physical distress.      Resistance Training   Training Prescription  Yes    Weight  blue bands    Reps  10-15       Perform Capillary Blood Glucose checks as needed.  Exercise Prescription Changes: Exercise Prescription Changes    Row Name 08/10/17 1500 08/24/17 1600 09/07/17 1600 09/16/17 0721 10/14/17 1454     Response to Exercise   Blood Pressure (Admit)  150/94  122/90  148/82  138/92  138/90   Blood Pressure (Exercise)  140/100  140/90  120/80  128/80  140/70   Blood Pressure (Exit)  118/82  138/84  122/78  120/78  130/80   Heart Rate (Admit)  75 bpm  77 bpm  68 bpm  70 bpm  80 bpm   Heart Rate (Exercise)  79 bpm  86 bpm  93 bpm  79 bpm  91 bpm   Heart Rate (Exit)  68 bpm  78 bpm  76 bpm  88 bpm  82 bpm   Oxygen Saturation (Admit)  97 %  94 %  93 %  92 %  94 %   Oxygen Saturation (Exercise)  93 %  90 %  89 %  88 %  92 %   Oxygen Saturation (Exit)  98 %  94 %  95 %  96 %  98 %   Rating of Perceived Exertion (Exercise)  12  11  12  13  12    Perceived Dyspnea (Exercise)  2  2  2  2  2    Duration  Progress to 45 minutes of aerobic exercise without signs/symptoms of physical distress  Progress to 45 minutes of aerobic exercise without signs/symptoms of physical distress  Progress to 45 minutes of aerobic exercise without signs/symptoms of physical distress  Progress  to 45 minutes of aerobic exercise without  signs/symptoms of physical distress  Progress to 45 minutes of aerobic exercise without signs/symptoms of physical distress   Intensity  Other (comment) 40-80% of HRR  Other (comment) 40-80% of HRR  THRR unchanged  THRR unchanged  THRR unchanged     Progression   Progression  Continue to progress workloads to maintain intensity without signs/symptoms of physical distress.  Continue to progress workloads to maintain intensity without signs/symptoms of physical distress.  Continue to progress workloads to maintain intensity without signs/symptoms of physical distress.  Continue to progress workloads to maintain intensity without signs/symptoms of physical distress.  Continue to progress workloads to maintain intensity without signs/symptoms of physical distress.     Resistance Training   Training Prescription  Yes  Yes  Yes  Yes  Yes   Weight  blue bands  blue bands  blue bands  orange bands  orange bands   Reps  10-15  10-15  10-15  10-15  10-15   Time  10 Minutes  10 Minutes  10 Minutes  10 Minutes  10 Minutes     Interval Training   Interval Training  Yes  Yes  -  -  No     NuStep   Level  1  -  1  1  1    SPM  80  -  -  -  -   Minutes  51  -  34  17  34   METs  1.2  -  1.9  1.5  1.5     Track   Laps  -  7  11  10   -   Minutes  -  17  17  17   -      Exercise Comments:   Exercise Goals and Review: Exercise Goals    Row Name 07/26/17 1441             Exercise Goals   Increase Physical Activity  Yes       Expected Outcomes  Short Term: Attend rehab on a regular basis to increase amount of physical activity.;Long Term: Add in home exercise to make exercise part of routine and to increase amount of physical activity.;Long Term: Exercising regularly at least 3-5 days a week.       Increase Strength and Stamina  Yes       Intervention  Provide advice, education, support and counseling about physical activity/exercise needs.;Develop an individualized exercise prescription for  aerobic and resistive training based on initial evaluation findings, risk stratification, comorbidities and participant's personal goals.       Expected Outcomes  Short Term: Increase workloads from initial exercise prescription for resistance, speed, and METs.;Short Term: Perform resistance training exercises routinely during rehab and add in resistance training at home;Long Term: Improve cardiorespiratory fitness, muscular endurance and strength as measured by increased METs and functional capacity (6MWT)       Able to understand and use rate of perceived exertion (RPE) scale  Yes       Intervention  Provide education and explanation on how to use RPE scale       Expected Outcomes  Short Term: Able to use RPE daily in rehab to express subjective intensity level;Long Term:  Able to use RPE to guide intensity level when exercising independently       Able to understand and use Dyspnea scale  Yes       Intervention  Provide education and explanation on how  to use Dyspnea scale       Expected Outcomes  Short Term: Able to use Dyspnea scale daily in rehab to express subjective sense of shortness of breath during exertion;Long Term: Able to use Dyspnea scale to guide intensity level when exercising independently       Knowledge and understanding of Target Heart Rate Range (THRR)  Yes       Intervention  Provide education and explanation of THRR including how the numbers were predicted and where they are located for reference       Expected Outcomes  Short Term: Able to state/look up THRR;Short Term: Able to use daily as guideline for intensity in rehab;Long Term: Able to use THRR to govern intensity when exercising independently       Understanding of Exercise Prescription  Yes       Intervention  Provide education, explanation, and written materials on patient's individual exercise prescription       Expected Outcomes  Short Term: Able to explain program exercise prescription;Long Term: Able to explain  home exercise prescription to exercise independently          Exercise Goals Re-Evaluation : Exercise Goals Re-Evaluation    Row Name 08/31/17 0818 09/28/17 3299 10/22/17 1059         Exercise Goal Re-Evaluation   Exercise Goals Review  Increase Physical Activity;Increase Strength and Stamina;Able to understand and use Dyspnea scale;Able to understand and use rate of perceived exertion (RPE) scale;Knowledge and understanding of Target Heart Rate Range (THRR);Understanding of Exercise Prescription  Increase Physical Activity;Increase Strength and Stamina;Able to understand and use Dyspnea scale;Able to understand and use rate of perceived exertion (RPE) scale;Knowledge and understanding of Target Heart Rate Range (THRR);Understanding of Exercise Prescription  Increase Physical Activity;Increase Strength and Stamina;Able to understand and use Dyspnea scale;Able to understand and use rate of perceived exertion (RPE) scale;Knowledge and understanding of Target Heart Rate Range (THRR);Understanding of Exercise Prescription     Comments  Patient has only attended 5 complete rehab sessions. Due to severity of disease and deconditioning, patient is going to progress slowly in program. MET average places her in a low functional level. She is able to complete 7-8 laps (24f each) in 15 minutes. Will cont. to monitor and motivate as able.   Patient has only attended 5 complete rehab sessions. Due to severity of disease and deconditioning, patient is going to progress slowly in program. MET average places her in a low functional level. She is able to complete 7-8 laps (2018feach) in 15 minutes. Patient is motivated to be here which is a change from when she first started. Patient has has recent hospitalization due to feeding tube placement. Patient will return to exercise once she gets the okay from surgeon. Will cont. to monitor and motivate as able.   Due to severity of disease and deconditioning, patient is  going to progress slowly in program. MET average places her in a low functional level. She is able to complete 8-10 laps ([id number]) in 15 minutes. Patient is motivated to be here which is a change from when she first started. Patient has has recent hospitalization due to feeding tube placement. Will cont. to monitor and motivate as able.      Expected Outcomes  Through exercise at rehab and at home, patient will increase physical capacity and be able to carry out ADL's with ease at home. Patient will also gain the confidence and knowledge to adhere to an exercise regime at home.  Through exercise at rehab and at home, the patient will decrease shortness of breath with daily activities and feel confident in carrying out an exercise regime at home.   Through exercise at rehab and at home, the patient will decrease shortness of breath with daily activities and feel confident in carrying out an exercise regime at home.         Discharge Exercise Prescription (Final Exercise Prescription Changes): Exercise Prescription Changes - 10/14/17 1454      Response to Exercise   Blood Pressure (Admit)  138/90    Blood Pressure (Exercise)  140/70    Blood Pressure (Exit)  130/80    Heart Rate (Admit)  80 bpm    Heart Rate (Exercise)  91 bpm    Heart Rate (Exit)  82 bpm    Oxygen Saturation (Admit)  94 %    Oxygen Saturation (Exercise)  92 %    Oxygen Saturation (Exit)  98 %    Rating of Perceived Exertion (Exercise)  12    Perceived Dyspnea (Exercise)  2    Duration  Progress to 45 minutes of aerobic exercise without signs/symptoms of physical distress    Intensity  THRR unchanged      Progression   Progression  Continue to progress workloads to maintain intensity without signs/symptoms of physical distress.      Resistance Training   Training Prescription  Yes    Weight  orange bands    Reps  10-15    Time  10 Minutes      Interval Training   Interval Training  No      NuStep   Level  1     Minutes  34    METs  1.5       Nutrition:  Target Goals: Understanding of nutrition guidelines, daily intake of sodium <153m, cholesterol <2042m calories 30% from fat and 7% or less from saturated fats, daily to have 5 or more servings of fruits and vegetables.  Biometrics: Pre Biometrics - 07/26/17 1531      Pre Biometrics   Grip Strength  26 kg        Nutrition Therapy Plan and Nutrition Goals: Nutrition Therapy & Goals - 07/30/17 1413      Nutrition Therapy   Diet  High Calorie, High Protein      Personal Nutrition Goals   Nutrition Goal  The pt will recognize symptoms that can interfere with adequate oral intake, such as shortness of breath, N/V, early satiety, fatigue, ability to secure and prepare food, taste and smell changes, chewing/swallowing difficulties, and/ or pain when eating.    Personal Goal #2  The pt will consume high-energy, high-nutrient dense beverages when necessary to compensate for decreased oral intake of solid foods.    Personal Goal #3  Identify food quantities necessary to achieve wt gain of  -2# per week to a goal wt gain of 2.7-10.9 kg (6-24 lb) at graduation from pulmonary rehab.      Intervention Plan   Intervention  Prescribe, educate and counsel regarding individualized specific dietary modifications aiming towards targeted core components such as weight, hypertension, lipid management, diabetes, heart failure and other comorbidities.    Expected Outcomes  Short Term Goal: Understand basic principles of dietary content, such as calories, fat, sodium, cholesterol and nutrients.;Long Term Goal: Adherence to prescribed nutrition plan.       Nutrition Assessments: Nutrition Assessments - 07/30/17 1406      Rate Your Plate Scores  Pre Score  48      MEDFICTS Scores   Pre Score  --       Nutrition Goals Re-Evaluation:   Nutrition Goals Discharge (Final Nutrition Goals Re-Evaluation):   Psychosocial: Target Goals: Acknowledge  presence or absence of significant depression and/or stress, maximize coping skills, provide positive support system. Participant is able to verbalize types and ability to use techniques and skills needed for reducing stress and depression.  Initial Review & Psychosocial Screening: Initial Psych Review & Screening - 07/26/17 1548      Family Dynamics   Comments  patient states she is grieving the recent loss of "who I was". because of her declining pulmonary health she is no longer able to maintaining an active lifestyle such as biking and hiking and she is being treated for depression.      Screening Interventions   Interventions  Encouraged to exercise;To provide support and resources with identified psychosocial needs;Provide feedback about the scores to participant    Expected Outcomes  Short Term goal: Utilizing psychosocial counselor, staff and physician to assist with identification of specific Stressors or current issues interfering with healing process. Setting desired goal for each stressor or current issue identified.;Long Term Goal: Stressors or current issues are controlled or eliminated.;Short Term goal: Identification and review with participant of any Quality of Life or Depression concerns found by scoring the questionnaire.;Long Term goal: The participant improves quality of Life and PHQ9 Scores as seen by post scores and/or verbalization of changes       Quality of Life Scores:  Scores of 19 and below usually indicate a poorer quality of life in these areas.  A difference of  2-3 points is a clinically meaningful difference.  A difference of 2-3 points in the total score of the Quality of Life Index has been associated with significant improvement in overall quality of life, self-image, physical symptoms, and general health in studies assessing change in quality of life.   PHQ-9: Recent Review Flowsheet Data    Depression screen [facility] 2/9 07/26/2017   Decreased Interest 0   Down,  Depressed, Hopeless 1   PHQ - 2 Score 1   Altered sleeping 0   Tired, decreased energy 3   Change in appetite 0   Feeling bad or failure about yourself  0   Trouble concentrating 0   Moving slowly or fidgety/restless 0   Suicidal thoughts 0   PHQ-9 Score 4   Difficult doing work/chores Not difficult at all     Interpretation of Total Score  Total Score Depression Severity:  1-4 = Minimal depression, 5-9 = Mild depression, 10-14 = Moderate depression, 15-19 = Moderately severe depression, 20-27 = Severe depression   Psychosocial Evaluation and Intervention: Psychosocial Evaluation - 09/29/17 1518      Psychosocial Evaluation & Interventions   Interventions  Encouraged to exercise with the program and follow exercise prescription    Comments  patient has been started on lexapro and feels this is working for her.       Psychosocial Re-Evaluation: Psychosocial Re-Evaluation    [place] Name 08/30/17 1805 10/26/17 1135           Psychosocial Re-Evaluation   Current issues with  Current Depression;Current Anxiety/Panic;Current Psychotropic Meds;Current Stress Concerns  Current Depression;Current Anxiety/Panic;Current Psychotropic Meds;Current Stress Concerns      Comments  patient has significant depression and anxiety over the loss "of who I was". she is having a very difficult time acception her current health status  and an even more difficult time accepting her malnutrition. she is currently seeing a psychiatrist.  patient has significant depression and anxiety over the loss "of who I was". she is having a very difficult time acception her current health status and an even more difficult time accepting her malnutrition. she is currently seeing a psychiatrist.      Expected Outcomes  patient will remain free from psychosocial barriers to participation in pulmonary rehab.  patient will remain free from psychosocial barriers to participation in pulmonary rehab.      Interventions   Encouraged to attend Pulmonary Rehabilitation for the exercise  Encouraged to attend Pulmonary Rehabilitation for the exercise      Continue Psychosocial Services   Follow up required by staff  No Follow up required      Comments  patient is grieving the loss of her health. she is malnurished and she fails to accept a needed intervention. 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  patient is grieving the loss of her health. she is malnurished and she fails to accept a needed intervention. 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        Initial Review   Source of Stress Concerns  Chronic Illness;Poor Coping Skills;Family;Retirement/disability;Unable to participate in former interests or hobbies;Unable to perform yard/household activities  Chronic Illness;Poor Coping Skills;Family;Retirement/disability;Unable to participate in former interests or hobbies;Unable to perform yard/household activities         Psychosocial Discharge (Final Psychosocial Re-Evaluation): Psychosocial Re-Evaluation - 10/26/17 1135      Psychosocial Re-Evaluation   Current issues with  Current Depression;Current Anxiety/Panic;Current Psychotropic Meds;Current Stress Concerns    Comments  patient has significant depression and anxiety over the loss "of who I was". she is having a very difficult time acception her current health status and an even more difficult time accepting her malnutrition. she is currently seeing a psychiatrist.    Expected Outcomes  patient will remain free from psychosocial barriers to participation in pulmonary rehab.    Interventions  Encouraged to attend Pulmonary Rehabilitation for the exercise    Continue Psychosocial Services   No Follow up required    Comments   patient is grieving the loss of her health. she is malnurished and she fails to accept a needed intervention. 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      Initial Review   Source of Stress Concerns  Chronic Illness;Poor Coping Skills;Family;Retirement/disability;Unable to participate in former interests or hobbies;Unable to perform yard/household activities        Education: Education Goals: Education classes will be provided on a weekly basis, covering required topics. Participant will state understanding/return demonstration of topics presented.  Learning Barriers/Preferences: Learning Barriers/Preferences - 07/26/17 1513      Learning Barriers/Preferences   Learning Barriers  None    Learning Preferences  Individual Instruction;Skilled Demonstration;Verbal Instruction;Written Material;Computer/Internet       Education Topics: How Lungs Work and Diseases: - Discuss the anatomy of the lungs and diseases that can affect the lungs, such as COPD.   Exercise: -Discuss the importance of exercise, FITT principles of exercise, normal and abnormal responses to exercise, and how  to exercise safely.   Environmental Irritants: -Discuss types of environmental irritants and how to limit exposure to environmental irritants.   Meds/Inhalers and oxygen: - Discuss respiratory medications, definition of an inhaler and oxygen, and the proper way to use an inhaler and oxygen.   Energy Saving Techniques: - Discuss methods to conserve energy and decrease shortness of breath when performing activities of daily living.    Bronchial Hygiene / Breathing Techniques: - Discuss breathing mechanics, pursed-lip breathing technique,  proper posture, effective ways to clear airways, and other functional breathing techniques   Cleaning Equipment: - Provides group verbal and written  instruction about the health risks of elevated stress, cause of high stress, and healthy ways to reduce stress.   Nutrition I: Fats: - Discuss the types of cholesterol, what cholesterol does to the body, and how cholesterol levels can be controlled.   Nutrition II: Labels: -Discuss the different components of food labels and how to read food labels.   Respiratory Infections: - Discuss the signs and symptoms of respiratory infections, ways to prevent respiratory infections, and the importance of seeking medical treatment when having a respiratory infection.   Stress I: Signs and Symptoms: - Discuss the causes of stress, how stress may lead to anxiety and depression, and ways to limit stress.   Stress II: Relaxation: -Discuss relaxation techniques to limit stress.   Oxygen for Home/Travel: - Discuss how to prepare for travel when on oxygen and proper ways to transport and store oxygen to ensure safety.   Knowledge Questionnaire Score: Knowledge Questionnaire Score - 07/26/17 1513      Knowledge Questionnaire Score   Pre Score  13/18       Core Components/Risk Factors/Patient Goals at Admission: Personal Goals and Risk Factors at Admission - 07/26/17 1514      Core Components/Risk Factors/Patient Goals on Admission    Weight Management  Weight Gain    Intervention  Weight Management: Develop a combined nutrition and exercise program designed to reach desired caloric intake, while maintaining appropriate intake of nutrient and fiber, sodium and fats, and appropriate energy expenditure required for the weight goal.;Weight Management: Provide education and appropriate resources to help participant work on and attain dietary goals.    Expected Outcomes  Short Term: Continue to assess and modify interventions until short term weight is achieved;Weight Gain: Understanding of general recommendations for a high calorie, high protein meal plan that promotes weight gain by distributing  calorie intake throughout the day with the consumption for 4-5 meals, snacks, and/or supplements;Understanding of distribution of calorie intake throughout the day with the consumption of 4-5 meals/snacks;Understanding recommendations for meals to include 15-35% energy as protein, 25-35% energy from fat, 35-60% energy from carbohydrates, less than 229m of dietary cholesterol, 20-35 gm of total fiber daily;Long Term: Adherence to nutrition and physical activity/exercise program aimed toward attainment of established weight goal    Improve shortness of breath with ADL's  Yes    Intervention  Provide education, individualized exercise plan and daily activity instruction to help decrease symptoms of SOB with activities of daily living.    Expected Outcomes  Short Term: Improve cardiorespiratory fitness to achieve a reduction of symptoms when performing ADLs;Long Term: Be able to perform more ADLs without symptoms or delay the onset of symptoms       Core Components/Risk Factors/Patient Goals Review:  Goals and Risk Factor Review    Row Name 08/30/17 1756 09/29/17 1508 10/26/17 1132         Core  Components/Risk Factors/Patient Goals Review   Personal Goals Review  Weight Management/Obesity;Improve shortness of breath with ADL's;Develop more efficient breathing techniques such as purse lipped breathing and diaphragmatic breathing and practicing self-pacing with activity.;Stress  Weight Management/Obesity;Improve shortness of breath with ADL's;Develop more efficient breathing techniques such as purse lipped breathing and diaphragmatic breathing and practicing self-pacing with activity.;Stress  Weight Management/Obesity;Improve shortness of breath with ADL's weight gain     Review  patient has attended 6 sessions since admission. it is too soon to evaluate progression towards pulmonary rehab goals. Patient has multiple barriers to participation in pulmonary rehab. Physical and psychosocial. she has never  arrived to class on time and shown up as late as 45 min. RN and patient have discussed her malnutrition at length. RN concerned that her work of breathing is not only related to her pulmonary disease but to her malnutrition. patient states she has been scheduled for a feeding tube multiple times however has cancelled all appointments. she has been encouraged to revisit the idea. admission weight 44.7/last session weight 44.2kg. patient believes she can increase her caloric intake for weight gain however I am unsure if this is possible at this time. Patient has met with department RD. RN has not encouraged workload increases related to the lack of energy and nutrition to facilitate safe exercise. Patient is encouraged to pace herself while on the track and PLB. Unsure if patient will progress in pulmonary rehab if she does not have the proper nutrition. Her stamina and strength may be at a standstill until other issues are addressed.  patients weight warrents close monitoring and intervention with MD if needed and if weight loss is observed with increased activity, pulmonary rehab may need to be placed on hold.  Patient has never arrived to class on time and shown up as late as 30 min. Pt is malnourished and has finally agreed to having a PEG tube placed.  Pt is presently out on medical hold and last attended on 5/28. [place] admission weight 44.7/last session weight 44.0kg.   Patient has met with department RD. Per pt husband who is a participant in cardiac rehab maintenance, pt is doing good but has not began feedings yet.  Pt is to have home health nurse come to the home. Pt is flusing the peg tube to insure patentcy.  Patient is encouraged to pace herself while on the track and PLB. I anticipate when feedings have began and pt ris able to return to pulmonary rehab, pt will begin to show progress in the next 30 days.       Returned to exercise class after PEG tube placement, no weight gain as of yet, weak, slow to  progress, level 1 on nustep, 9-10 laps on track     Expected Outcomes  see admission expected outcomes.  see admission expected outcomes.  see admission expected outcomes.        Core Components/Risk Factors/Patient Goals at Discharge (Final Review):  Goals and Risk Factor Review - 10/26/17 1132      Core Components/Risk Factors/Patient Goals Review   Personal Goals Review  Weight Management/Obesity;Improve shortness of breath with ADL's weight gain    Review  Returned to exercise class after PEG tube placement, no weight gain as of yet, weak, slow to progress, level 1 on nustep, 9-10 laps on track    Expected Outcomes  see admission expected outcomes.       ITP Comments: ITP Comments    Row Name 08/30/17 1755 09/29/17  1508         ITP Comments  Dr. Jennet Maduro, Medical Director  Dr. Jennet Maduro, Medical Director         Comments: ITP REVIEW Pt is making expected progress toward pulmonary rehab goals after completing 15 sessions. Recommend continued exercise, life style modification, education, and utilization of breathing techniques to increase stamina and strength and decrease shortness of breath with exertion.

## 2017-10-26 NOTE — Progress Notes (Signed)
Daily Session Note  Patient Details  Name: Sara Beasley MRN: [id number] Date of Birth: 1945-10-15 Referring Provider:     Pulmonary Rehab Walk Test from 07/29/2017 in [place]  Referring Provider  Dr. Nelda Marseille      Encounter Date: 10/26/2017  Check In: Session Check In - 10/26/17 1330      Check-In   Location  [facility]    Staff Present  Rosebud Poles, RN, BSN;Carlette Carlton, RN, BSN;Molly DiVincenzo, MS, ACSM RCEP, Exercise Physiologist;Lisa Ysidro Evert, Felipe Drone, RN, [facility]    Supervising physician immediately available to respond to emergencies  [facility] immediately available    Physician(s)  Dr. Bonner Puna    Medication changes reported      No    Fall or balance concerns reported     No    Tobacco Cessation  No Change    Warm-up and Cool-down  Performed as group-led instruction    Resistance Training Performed  Yes    VAD Patient?  No    PAD/SET Patient?  No      Pain Assessment   Currently in Pain?  No/denies    Multiple Pain Sites  No       Capillary Blood Glucose: No results found for this or any previous visit (from the past 24 hour(s)).    Social History   Tobacco Use  Smoking Status Former Smoker  . Packs/day: 1.00  . Years: 15.00  . Pack years: 15.00  . Types: Cigarettes  . Last attempt to quit: 04/27/1974  . Years since quitting: 43.5  Smokeless Tobacco Never Used    Goals Met:  Exercise tolerated well Strength training completed today  Goals Unmet:  Not Applicable  Comments: Service time is from 1330 to 1505    Dr. Rush Farmer is Medical Director for Pulmonary Rehab at [facility].

## 2017-11-02 ENCOUNTER — Encounter (HOSPITAL_COMMUNITY)
Admission: RE | Admit: 2017-11-02 | Discharge: 2017-11-02 | Disposition: A | Discharge status: Home / Self Care | Payer: Medicare Other | Source: Ambulatory Visit | Attending: Pulmonary Disease | Admitting: Pulmonary Disease

## 2017-11-02 VITALS — Wt 97.4 lb

## 2017-11-02 DIAGNOSIS — J849 Interstitial pulmonary disease, unspecified: Secondary | ICD-10-CM | POA: Diagnosis not present

## 2017-11-02 NOTE — Progress Notes (Signed)
Daily Session Note  Patient Details  Name: Sara Beasley MRN: [id number] Date of Birth: 1945/06/21 Referring Provider:     Pulmonary Rehab Walk Test from 07/29/2017 in [place]  Referring Provider  Dr. Nelda Marseille      Encounter Date: 11/02/2017  Check In: Session Check In - 11/02/17 1330      Check-In   Location  [facility]    Staff Present  Rosebud Poles, RN, BSN;Carlette Wilber Oliphant, RN, BSN;Lisa Ysidro Evert, Felipe Drone, RN, [facility]    Supervising physician immediately available to respond to emergencies  [facility] immediately available    Physician(s)  Dr. Verlon Au    Medication changes reported      No    Fall or balance concerns reported     No    Tobacco Cessation  No Change    Warm-up and Cool-down  Performed as group-led instruction    Resistance Training Performed  Yes    VAD Patient?  No    PAD/SET Patient?  No      Pain Assessment   Currently in Pain?  No/denies    Multiple Pain Sites  No       Capillary Blood Glucose: No results found for this or any previous visit (from the past 24 hour(s)).  Exercise Prescription Changes - 11/02/17 1600      Response to Exercise   Blood Pressure (Admit)  132/80    Blood Pressure (Exercise)  124/80    Blood Pressure (Exit)  106/78    Heart Rate (Admit)  84 bpm    Heart Rate (Exercise)  99 bpm    Heart Rate (Exit)  83 bpm    Oxygen Saturation (Admit)  91 %    Oxygen Saturation (Exercise)  87 %    Oxygen Saturation (Exit)  94 %    Rating of Perceived Exertion (Exercise)  11    Perceived Dyspnea (Exercise)  2    Duration  Progress to 45 minutes of aerobic exercise without signs/symptoms of physical distress    Intensity  THRR unchanged      Progression   Progression  Continue to progress workloads to maintain intensity without signs/symptoms of physical distress.      Resistance Training   Training Prescription  Yes    Weight  orange bands    Reps  10-15    Time   10 Minutes      Interval Training   Interval Training  No      NuStep   Level  1    Minutes  34    METs  1.3      Track   Laps  11    Minutes  17       Social History   Tobacco Use  Smoking Status Former Smoker  . Packs/day: 1.00  . Years: 15.00  . Pack years: 15.00  . Types: Cigarettes  . Last attempt to quit: 04/27/1974  . Years since quitting: 43.5  Smokeless Tobacco Never Used    Goals Met:  Exercise tolerated well Strength training completed today  Goals Unmet:  Not Applicable  Comments: Service time is from 1330 to 1520    Dr. Rush Farmer is Medical Director for Pulmonary Rehab at [facility].

## 2017-11-04 ENCOUNTER — Encounter (HOSPITAL_COMMUNITY)
Admission: RE | Admit: 2017-11-04 | Discharge: 2017-11-04 | Disposition: A | Discharge status: Home / Self Care | Payer: Medicare Other | Source: Ambulatory Visit | Attending: Pulmonary Disease | Admitting: Pulmonary Disease

## 2017-11-04 ENCOUNTER — Encounter (HOSPITAL_COMMUNITY): Payer: Medicare Other

## 2017-11-04 VITALS — Wt 97.4 lb

## 2017-11-04 DIAGNOSIS — J849 Interstitial pulmonary disease, unspecified: Secondary | ICD-10-CM

## 2017-11-04 NOTE — Progress Notes (Signed)
Daily Session Note  Patient Details  Name: Sara Beasley MRN: [id number] Date of Birth: 1945-06-28 Referring Provider:     Pulmonary Rehab Walk Test from 07/29/2017 in [place]  Referring Provider  Dr. Nelda Marseille      Encounter Date: 11/04/2017  Check In: Session Check In - 11/04/17 1400      Check-In   Location  [facility]    Staff Present  Rosebud Poles, RN, BSN;Carlette Wilber Oliphant, RN, BSN;Lisa Ysidro Evert, Felipe Drone, RN, [facility]    Supervising physician immediately available to respond to emergencies  [facility] immediately available    Physician(s)  Dr. Broadus John    Medication changes reported      No    Fall or balance concerns reported     No    Tobacco Cessation  No Change    Warm-up and Cool-down  Performed as group-led instruction    Resistance Training Performed  Yes    VAD Patient?  No    PAD/SET Patient?  No      Pain Assessment   Currently in Pain?  No/denies    Multiple Pain Sites  No       Capillary Blood Glucose: No results found for this or any previous visit (from the past 24 hour(s)).    Social History   Tobacco Use  Smoking Status Former Smoker  . Packs/day: 1.00  . Years: 15.00  . Pack years: 15.00  . Types: Cigarettes  . Last attempt to quit: 04/27/1974  . Years since quitting: 43.5  Smokeless Tobacco Never Used    Goals Met:  Exercise tolerated well Strength training completed today  Goals Unmet:  Not Applicable  Comments: Service time is from 1443 to 1525   Dr. Rush Farmer is Medical Director for Pulmonary Rehab at [facility].

## 2017-11-09 ENCOUNTER — Encounter (HOSPITAL_COMMUNITY)
Admission: RE | Admit: 2017-11-09 | Discharge: 2017-11-09 | Disposition: A | Discharge status: Home / Self Care | Payer: Medicare Other | Source: Ambulatory Visit | Attending: Pulmonary Disease | Admitting: Pulmonary Disease

## 2017-11-09 VITALS — Wt 97.4 lb

## 2017-11-09 DIAGNOSIS — J849 Interstitial pulmonary disease, unspecified: Secondary | ICD-10-CM | POA: Diagnosis not present

## 2017-11-09 NOTE — Progress Notes (Signed)
Daily Session Note  Patient Details  Name: Sara Beasley MRN: [id number] Date of Birth: [birth year] Referring Provider:     Pulmonary Rehab Walk Test from 07/29/2017 in [place]  Referring Provider  Dr. Nelda Marseille      Encounter Date: 11/09/2017  Check In: Session Check In - 11/09/17 1330      Check-In   Location  [facility]    Staff Present  Rosebud Poles, RN, BSN;Carlette Carlton, RN, BSN;Molly DiVincenzo, MS, ACSM RCEP, Exercise Physiologist;Lisa Ysidro Evert, Felipe Drone, RN, [facility]    Supervising physician immediately available to respond to emergencies  [facility] immediately available    Physician(s)  Dr. Broadus John    Medication changes reported      No    Fall or balance concerns reported     No    Tobacco Cessation  No Change    Warm-up and Cool-down  Performed as group-led instruction    Resistance Training Performed  Yes    VAD Patient?  No    PAD/SET Patient?  No      Pain Assessment   Currently in Pain?  No/denies       Capillary Blood Glucose: No results found for this or any previous visit (from the past 24 hour(s)).    Social History   Tobacco Use  Smoking Status Former Smoker  . Packs/day: 1.00  . Years: 15.00  . Pack years: 15.00  . Types: Cigarettes  . Last attempt to quit: 04/27/1974  . Years since quitting: 43.5  Smokeless Tobacco Never Used    Goals Met:  Exercise tolerated well Strength training completed today  Goals Unmet:  Not Applicable  Comments: Service time is from 1330 to 1500   Dr. Rush Farmer is Medical Director for Pulmonary Rehab at [facility].

## 2017-11-11 ENCOUNTER — Encounter (HOSPITAL_COMMUNITY)
Admission: RE | Admit: 2017-11-11 | Discharge: 2017-11-11 | Disposition: A | Discharge status: Home / Self Care | Payer: Medicare Other | Source: Ambulatory Visit | Attending: Pulmonary Disease | Admitting: Pulmonary Disease

## 2017-11-11 VITALS — Wt 97.7 lb

## 2017-11-11 DIAGNOSIS — J849 Interstitial pulmonary disease, unspecified: Secondary | ICD-10-CM

## 2017-11-11 NOTE — Progress Notes (Signed)
Daily Session Note  Patient Details  Name: Sara Beasley MRN: [id number] Date of Birth: Nov 05, 1945 Referring Provider:     Pulmonary Rehab Walk Test from 07/29/2017 in [place]  Referring Provider  Dr. Nelda Marseille      Encounter Date: 11/11/2017  Check In: Session Check In - 11/11/17 1330      Check-In   Location  [facility]    Staff Present  Rosebud Poles, RN, BSN;Carlette Carlton, RN, BSN;Molly DiVincenzo, MS, ACSM RCEP, Exercise Physiologist;Lisa Ysidro Evert, Felipe Drone, RN, [facility]    Supervising physician immediately available to respond to emergencies  [facility] immediately available    Physician(s)  Dr. Denton Brick    Medication changes reported      No    Fall or balance concerns reported     No    Warm-up and Cool-down  Performed as group-led instruction    Resistance Training Performed  Yes    VAD Patient?  No    PAD/SET Patient?  No      Pain Assessment   Currently in Pain?  No/denies    Multiple Pain Sites  No       Capillary Blood Glucose: No results found for this or any previous visit (from the past 24 hour(s)).    Social History   Tobacco Use  Smoking Status Former Smoker  . Packs/day: 1.00  . Years: 15.00  . Pack years: 15.00  . Types: Cigarettes  . Last attempt to quit: 04/27/1974  . Years since quitting: 43.5  Smokeless Tobacco Never Used    Goals Met:  Exercise tolerated well Strength training completed today  Goals Unmet:  Not Applicable  Comments: Service time is from 1330 to 1530    Dr. Rush Farmer is Medical Director for Pulmonary Rehab at [facility].

## 2017-11-16 ENCOUNTER — Encounter (HOSPITAL_COMMUNITY)
Admission: RE | Admit: 2017-11-16 | Discharge: 2017-11-16 | Disposition: A | Discharge status: Home / Self Care | Payer: Medicare Other | Source: Ambulatory Visit | Attending: Pulmonary Disease | Admitting: Pulmonary Disease

## 2017-11-16 VITALS — Wt 98.1 lb

## 2017-11-16 DIAGNOSIS — J849 Interstitial pulmonary disease, unspecified: Secondary | ICD-10-CM

## 2017-11-16 NOTE — Progress Notes (Signed)
Daily Session Note  Patient Details  Name: Sara Beasley MRN: [id number] Date of Birth: [birth year] Referring Provider:     Pulmonary Rehab Walk Test from 07/29/2017 in [place]  Referring Provider  Dr. Nelda Marseille      Encounter Date: 11/16/2017  Check In: Session Check In - 11/16/17 1330      Check-In   Location  [facility]    Staff Present  Rosebud Poles, RN, BSN;Carlette Carlton, RN, BSN;Molly DiVincenzo, MS, ACSM RCEP, Exercise Physiologist;Lisa Ysidro Evert, Felipe Drone, RN, [facility]    Supervising physician immediately available to respond to emergencies  [facility] immediately available    Physician(s)  Dr. Denton Brick    Medication changes reported      No    Fall or balance concerns reported     No    Tobacco Cessation  No Change    Warm-up and Cool-down  Performed as group-led instruction    Resistance Training Performed  Yes    VAD Patient?  No    PAD/SET Patient?  No      Pain Assessment   Currently in Pain?  No/denies    Multiple Pain Sites  No       Capillary Blood Glucose: No results found for this or any previous visit (from the past 24 hour(s)).  Exercise Prescription Changes - 11/16/17 1500      Response to Exercise   Blood Pressure (Admit)  130/80    Blood Pressure (Exercise)  130/70    Blood Pressure (Exit)  118/70    Heart Rate (Admit)  80 bpm    Heart Rate (Exercise)  86 bpm    Heart Rate (Exit)  78 bpm    Oxygen Saturation (Admit)  94 %    Oxygen Saturation (Exercise)  91 %    Oxygen Saturation (Exit)  91 %    Rating of Perceived Exertion (Exercise)  14    Perceived Dyspnea (Exercise)  3    Duration  Progress to 45 minutes of aerobic exercise without signs/symptoms of physical distress    Intensity  THRR unchanged      Progression   Progression  Continue to progress workloads to maintain intensity without signs/symptoms of physical distress.      Resistance Training   Training  Prescription  Yes    Weight  orange bands    Reps  10-15    Time  10 Minutes      Interval Training   Interval Training  No      NuStep   Level  3    Minutes  34    METs  1.5      Track   Laps  8    Minutes  17       Social History   Tobacco Use  Smoking Status Former Smoker  . Packs/day: 1.00  . Years: 15.00  . Pack years: 15.00  . Types: Cigarettes  . Last attempt to quit: 04/27/1974  . Years since quitting: 43.5  Smokeless Tobacco Never Used    Goals Met:  Exercise tolerated well Strength training completed today  Goals Unmet:  Not Applicable  Comments: Service time is from 1330 to 1500.    Dr. Rush Farmer is Medical Director for Pulmonary Rehab at [facility].

## 2017-11-16 NOTE — Progress Notes (Signed)
Pulmonary Individual Treatment Plan  Patient Details  Name: Sara Beasley MRN: [id number] Date of Birth: 04-21-1946 Referring Provider:     Pulmonary Rehab Walk Test from 07/29/2017 in [place]  Referring Provider  Dr. Nelda Marseille      Initial Encounter Date:    Pulmonary Rehab Walk Test from 07/29/2017 in [place]  Date  07/29/17      Visit Diagnosis: ILD (interstitial lung disease) ([place])  Patient's Home Medications on Admission:   Current Outpatient Medications:  .  ALPRAZolam (XANAX) 0.5 MG tablet, Take by mouth 3 (three) times daily as needed. Take 1/2 tab up to TID PRN, Disp: , Rfl:  .  azathioprine (IMURAN) 100 MG tablet, Take 100 mg by mouth daily., Disp: , Rfl:  .  calcium carbonate (OS-CAL) 600 MG TABS, Take 600 mg by mouth every other day. , Disp: , Rfl:  .  cetirizine (ZYRTEC) 10 MG tablet, Take 5 mg by mouth daily., Disp: , Rfl:  .  cholecalciferol (VITAMIN D) 1000 UNITS tablet, Take 2,000 Units by mouth daily. , Disp: , Rfl:  .  escitalopram (LEXAPRO) 10 MG tablet, 10 mg daily. , Disp: , Rfl:  .  ipratropium-albuterol (DUONEB) 0.5-2.5 (3) MG/3ML SOLN, Take 3 mLs by nebulization every 4 (four) hours as needed (SOB, cough, wheezing). (Patient not taking: Reported on 07/26/2017), Disp: 360 mL, Rfl: 0 .  mirtazapine (REMERON SOL-TAB) 15 MG disintegrating tablet, Take 7.5 mg by mouth at bedtime. , Disp: , Rfl:  .  montelukast (SINGULAIR) 10 MG tablet, Take 0.5 tablets (5 mg total) by mouth at bedtime., Disp: 30 tablet, Rfl: 12 .  Multiple Vitamin (MULTIVITAMIN) tablet, Take 1 tablet by mouth every other day. , Disp: , Rfl:  .  predniSONE (DELTASONE) 10 MG tablet, Take 2 tabs daily for 1 week, then resume taking 1 tab daily, Disp: , Rfl:   Past Medical History: Past Medical History:  Diagnosis Date  . ALLERGIC RHINITIS   . Allergy   . Chronic sinusitis   . Lung infection    sees Dr. Annamaria Boots- had infection 2015   . Osteopenia     Tobacco Use: Social History   Tobacco Use  Smoking Status Former Smoker  . Packs/day: 1.00  . Years: 15.00  . Pack years: 15.00  . Types: Cigarettes  . Last attempt to quit: 04/27/1974  . Years since quitting: 43.5  Smokeless Tobacco Never Used    Labs: Recent Review Flowsheet Data    There is no flowsheet data to display.      Capillary Blood Glucose: No results found for: GLUCAP   Pulmonary Assessment Scores: Pulmonary Assessment Scores    Row Name 07/30/17 0704         ADL UCSD   ADL Phase  Entry       mMRC Score   mMRC Score  1        Pulmonary Function Assessment:   Exercise Target Goals:    Exercise Program Goal: Individual exercise prescription set using results from initial 6 min walk test and THRR while considering  patient's activity barriers and safety.    Exercise Prescription Goal: Initial exercise prescription builds to 30-45 minutes a day of aerobic activity, 2-3 days per week.  Home exercise guidelines will be given to patient during program as part of exercise prescription that the participant will acknowledge.  Activity Barriers & Risk Stratification: Activity Barriers & Cardiac Risk Stratification - 07/26/17 1441  Activity Barriers & Cardiac Risk Stratification   Activity Barriers  Deconditioning;Shortness of Breath;Muscular Weakness       6 Minute Walk: 6 Minute Walk    Row Name 07/30/17 0704         6 Minute Walk   Phase  Initial     Distance  1123 feet     Walk Time  6 minutes     # of Rest Breaks  0     MPH  2.12     METS  2.61     RPE  13     Perceived Dyspnea   3     Symptoms  No     Resting HR  80 bpm     Resting BP  160/90     Resting Oxygen Saturation   94 %     Exercise Oxygen Saturation  during 6 min walk  87 %     Max Ex. HR  107 bpm     Max Ex. BP  178/90     2 Minute Post BP  150/100       Interval HR   1 Minute HR  92     2 Minute HR  93     3 Minute HR  97     4 Minute HR   97     5 Minute HR  100     6 Minute HR  107     2 Minute Post HR  85     Interval Heart Rate?  Yes       Interval Oxygen   Interval Oxygen?  Yes     Baseline Oxygen Saturation %  94 %     1 Minute Oxygen Saturation %  94 %     1 Minute Liters of Oxygen  0 L     2 Minute Oxygen Saturation %  91 %     2 Minute Liters of Oxygen  0 L     3 Minute Oxygen Saturation %  88 %     3 Minute Liters of Oxygen  0 L     4 Minute Oxygen Saturation %  88 %     4 Minute Liters of Oxygen  0 L     5 Minute Oxygen Saturation %  87 %     5 Minute Liters of Oxygen  0 L     6 Minute Oxygen Saturation %  94 %     6 Minute Liters of Oxygen  0 L     2 Minute Post Oxygen Saturation %  92 %     2 Minute Post Liters of Oxygen  0 L        Oxygen Initial Assessment: Oxygen Initial Assessment - 07/30/17 0703      Initial 6 min Walk   Oxygen Used  None      Program Oxygen Prescription   Program Oxygen Prescription  None patient desaturated to 87% during 6MWT will re-evaluate        Oxygen Re-Evaluation: Oxygen Re-Evaluation    Row Name 08/31/17 0818 09/28/17 3646 10/22/17 1059 11/15/17 1216       Program Oxygen Prescription   Program Oxygen Prescription  None  None  None  None      Home Oxygen   Home Oxygen Device  None  None  None  None    Sleep Oxygen Prescription  None  None  None  None    Home Exercise  Oxygen Prescription  None  None  None  None    Home at Rest Exercise Oxygen Prescription  None  None  None  None       Oxygen Discharge (Final Oxygen Re-Evaluation): Oxygen Re-Evaluation - 11/15/17 1216      Program Oxygen Prescription   Program Oxygen Prescription  None      Home Oxygen   Home Oxygen Device  None    Sleep Oxygen Prescription  None    Home Exercise Oxygen Prescription  None    Home at Rest Exercise Oxygen Prescription  None       Initial Exercise Prescription: Initial Exercise Prescription - 07/30/17 0700      Date of Initial Exercise RX and Referring Provider    Date  07/29/17    Referring Provider  Dr. Nelda Marseille      Bike   Level  1 scitfit    Watts  10    Minutes  17      NuStep   Level  1    SPM  80    Minutes  17    METs  1.5      Track   Laps  8    Minutes  17      Prescription Details   Frequency (times per week)  2    Duration  Progress to 45 minutes of aerobic exercise without signs/symptoms of physical distress      Intensity   THRR 40-80% of Max Heartrate  59-118    Ratings of Perceived Exertion  11-13    Perceived Dyspnea  0-4      Progression   Progression  Continue progressive overload as per policy without signs/symptoms or physical distress.      Resistance Training   Training Prescription  Yes    Weight  blue bands    Reps  10-15       Perform Capillary Blood Glucose checks as needed.  Exercise Prescription Changes:  Exercise Prescription Changes    Row Name 08/10/17 1500 08/24/17 1600 09/07/17 1600 09/16/17 0721 10/14/17 1454     Response to Exercise   Blood Pressure (Admit)  150/94  122/90  148/82  138/92  138/90   Blood Pressure (Exercise)  140/100  140/90  120/80  128/80  140/70   Blood Pressure (Exit)  118/82  138/84  122/78  120/78  130/80   Heart Rate (Admit)  75 bpm  77 bpm  68 bpm  70 bpm  80 bpm   Heart Rate (Exercise)  79 bpm  86 bpm  93 bpm  79 bpm  91 bpm   Heart Rate (Exit)  68 bpm  78 bpm  76 bpm  88 bpm  82 bpm   Oxygen Saturation (Admit)  97 %  94 %  93 %  92 %  94 %   Oxygen Saturation (Exercise)  93 %  90 %  89 %  88 %  92 %   Oxygen Saturation (Exit)  98 %  94 %  95 %  96 %  98 %   Rating of Perceived Exertion (Exercise)  12  11  12  13  12    Perceived Dyspnea (Exercise)  2  2  2  2  2    Duration  Progress to 45 minutes of aerobic exercise without signs/symptoms of physical distress  Progress to 45 minutes of aerobic exercise without signs/symptoms of physical distress  Progress to 45 minutes of aerobic exercise without  signs/symptoms of physical distress  Progress to 45 minutes of  aerobic exercise without signs/symptoms of physical distress  Progress to 45 minutes of aerobic exercise without signs/symptoms of physical distress   Intensity  Other (comment) 40-80% of HRR  Other (comment) 40-80% of HRR  THRR unchanged  THRR unchanged  THRR unchanged     Progression   Progression  Continue to progress workloads to maintain intensity without signs/symptoms of physical distress.  Continue to progress workloads to maintain intensity without signs/symptoms of physical distress.  Continue to progress workloads to maintain intensity without signs/symptoms of physical distress.  Continue to progress workloads to maintain intensity without signs/symptoms of physical distress.  Continue to progress workloads to maintain intensity without signs/symptoms of physical distress.     Resistance Training   Training Prescription  Yes  Yes  Yes  Yes  Yes   Weight  blue bands  blue bands  blue bands  orange bands  orange bands   Reps  10-15  10-15  10-15  10-15  10-15   Time  10 Minutes  10 Minutes  10 Minutes  10 Minutes  10 Minutes     Interval Training   Interval Training  Yes  Yes  -  -  No     NuStep   Level  1  -  1  1  1    SPM  80  -  -  -  -   Minutes  51  -  34  17  34   METs  1.2  -  1.9  1.5  1.5     Track   Laps  -  7  11  10   -   Minutes  -  17  17  17   -   Row Name 11/02/17 1600 11/16/17 1500           Response to Exercise   Blood Pressure (Admit)  132/80  130/80      Blood Pressure (Exercise)  124/80  130/70      Blood Pressure (Exit)  106/78  118/70      Heart Rate (Admit)  84 bpm  80 bpm      Heart Rate (Exercise)  99 bpm  86 bpm      Heart Rate (Exit)  83 bpm  78 bpm      Oxygen Saturation (Admit)  91 %  94 %      Oxygen Saturation (Exercise)  87 %  91 %      Oxygen Saturation (Exit)  94 %  91 %      Rating of Perceived Exertion (Exercise)  11  14      Perceived Dyspnea (Exercise)  2  3      Duration  Progress to 45 minutes of aerobic exercise without  signs/symptoms of physical distress  Progress to 45 minutes of aerobic exercise without signs/symptoms of physical distress      Intensity  THRR unchanged  THRR unchanged        Progression   Progression  Continue to progress workloads to maintain intensity without signs/symptoms of physical distress.  Continue to progress workloads to maintain intensity without signs/symptoms of physical distress.        Resistance Training   Training Prescription  Yes  Yes      Weight  orange bands  orange bands      Reps  10-15  10-15      Time  10 Minutes  10 Minutes        Interval Training   Interval Training  No  No        NuStep   Level  1  3      Minutes  34  34      METs  1.3  1.5        Track   Laps  11  8      Minutes  17  17         Exercise Comments:   Exercise Goals and Review:  Exercise Goals    Row Name 07/26/17 1441             Exercise Goals   Increase Physical Activity  Yes       Expected Outcomes  Short Term: Attend rehab on a regular basis to increase amount of physical activity.;Long Term: Add in home exercise to make exercise part of routine and to increase amount of physical activity.;Long Term: Exercising regularly at least 3-5 days a week.       Increase Strength and Stamina  Yes       Intervention  Provide advice, education, support and counseling about physical activity/exercise needs.;Develop an individualized exercise prescription for aerobic and resistive training based on initial evaluation findings, risk stratification, comorbidities and participant's personal goals.       Expected Outcomes  Short Term: Increase workloads from initial exercise prescription for resistance, speed, and METs.;Short Term: Perform resistance training exercises routinely during rehab and add in resistance training at home;Long Term: Improve cardiorespiratory fitness, muscular endurance and strength as measured by increased METs and functional capacity (6MWT)       Able to  understand and use rate of perceived exertion (RPE) scale  Yes       Intervention  Provide education and explanation on how to use RPE scale       Expected Outcomes  Short Term: Able to use RPE daily in rehab to express subjective intensity level;Long Term:  Able to use RPE to guide intensity level when exercising independently       Able to understand and use Dyspnea scale  Yes       Intervention  Provide education and explanation on how to use Dyspnea scale       Expected Outcomes  Short Term: Able to use Dyspnea scale daily in rehab to express subjective sense of shortness of breath during exertion;Long Term: Able to use Dyspnea scale to guide intensity level when exercising independently       Knowledge and understanding of Target Heart Rate Range (THRR)  Yes       Intervention  Provide education and explanation of THRR including how the numbers were predicted and where they are located for reference       Expected Outcomes  Short Term: Able to state/look up THRR;Short Term: Able to use daily as guideline for intensity in rehab;Long Term: Able to use THRR to govern intensity when exercising independently       Understanding of Exercise Prescription  Yes       Intervention  Provide education, explanation, and written materials on patient's individual exercise prescription       Expected Outcomes  Short Term: Able to explain program exercise prescription;Long Term: Able to explain home exercise prescription to exercise independently          Exercise Goals Re-Evaluation : Exercise Goals Re-Evaluation    Row Name 08/31/17 0818 09/28/17 0263 10/22/17 1059 11/15/17 1216  Exercise Goal Re-Evaluation   Exercise Goals Review  Increase Physical Activity;Increase Strength and Stamina;Able to understand and use Dyspnea scale;Able to understand and use rate of perceived exertion (RPE) scale;Knowledge and understanding of Target Heart Rate Range (THRR);Understanding of Exercise Prescription   Increase Physical Activity;Increase Strength and Stamina;Able to understand and use Dyspnea scale;Able to understand and use rate of perceived exertion (RPE) scale;Knowledge and understanding of Target Heart Rate Range (THRR);Understanding of Exercise Prescription  Increase Physical Activity;Increase Strength and Stamina;Able to understand and use Dyspnea scale;Able to understand and use rate of perceived exertion (RPE) scale;Knowledge and understanding of Target Heart Rate Range (THRR);Understanding of Exercise Prescription  Increase Physical Activity;Increase Strength and Stamina;Able to understand and use Dyspnea scale;Able to understand and use rate of perceived exertion (RPE) scale;Knowledge and understanding of Target Heart Rate Range (THRR);Understanding of Exercise Prescription    Comments  Patient has only attended 5 complete rehab sessions. Due to severity of disease and deconditioning, patient is going to progress slowly in program. MET average places her in a low functional level. She is able to complete 7-8 laps (236f each) in 15 minutes. Will cont. to monitor and motivate as able.   Patient has only attended 5 complete rehab sessions. Due to severity of disease and deconditioning, patient is going to progress slowly in program. MET average places her in a low functional level. She is able to complete 7-8 laps (2047feach) in 15 minutes. Patient is motivated to be here which is a change from when she first started. Patient has has recent hospitalization due to feeding tube placement. Patient will return to exercise once she gets the okay from surgeon. Will cont. to monitor and motivate as able.   Due to severity of disease and deconditioning, patient is going to progress slowly in program. MET average places her in a low functional level. She is able to complete 8-10 laps (20032fach) in 15 minutes. Patient is motivated to be here which is a change from when she first started. Patient has has recent  hospitalization due to feeding tube placement. Will cont. to monitor and motivate as able.   Due to severity of disease and deconditioning, patient is going to progress slowly in program. MET average places her in a low functional level. She is able to complete 9-11 laps ([id number]) in 15 minutes. Patient is motivated to be here which is a change from when she first started. Not much weight has been gained. Will cont. to monitor and motivate as able.     Expected Outcomes  Through exercise at rehab and at home, patient will increase physical capacity and be able to carry out ADL's with ease at home. Patient will also gain the confidence and knowledge to adhere to an exercise regime at home.  Through exercise at rehab and at home, the patient will decrease shortness of breath with daily activities and feel confident in carrying out an exercise regime at home.   Through exercise at rehab and at home, the patient will decrease shortness of breath with daily activities and feel confident in carrying out an exercise regime at home.   Through exercise at rehab and at home, the patient will decrease shortness of breath with daily activities and feel confident in carrying out an exercise regime at home.        Discharge Exercise Prescription (Final Exercise Prescription Changes): Exercise Prescription Changes - 11/16/17 1500      Response to Exercise   Blood Pressure (Admit)  130/80    Blood Pressure (Exercise)  130/70    Blood Pressure (Exit)  118/70    Heart Rate (Admit)  80 bpm    Heart Rate (Exercise)  86 bpm    Heart Rate (Exit)  78 bpm    Oxygen Saturation (Admit)  94 %    Oxygen Saturation (Exercise)  91 %    Oxygen Saturation (Exit)  91 %    Rating of Perceived Exertion (Exercise)  14    Perceived Dyspnea (Exercise)  3    Duration  Progress to 45 minutes of aerobic exercise without signs/symptoms of physical distress    Intensity  THRR unchanged      Progression   Progression  Continue to  progress workloads to maintain intensity without signs/symptoms of physical distress.      Resistance Training   Training Prescription  Yes    Weight  orange bands    Reps  10-15    Time  10 Minutes      Interval Training   Interval Training  No      NuStep   Level  3    Minutes  34    METs  1.5      Track   Laps  8    Minutes  17       Nutrition:  Target Goals: Understanding of nutrition guidelines, daily intake of sodium <1546m, cholesterol <2097m calories 30% from fat and 7% or less from saturated fats, daily to have 5 or more servings of fruits and vegetables.  Biometrics: Pre Biometrics - 07/26/17 1531      Pre Biometrics   Grip Strength  26 kg        Nutrition Therapy Plan and Nutrition Goals: Nutrition Therapy & Goals - 07/30/17 1413      Nutrition Therapy   Diet  High Calorie, High Protein      Personal Nutrition Goals   Nutrition Goal  The pt will recognize symptoms that can interfere with adequate oral intake, such as shortness of breath, N/V, early satiety, fatigue, ability to secure and prepare food, taste and smell changes, chewing/swallowing difficulties, and/ or pain when eating.    Personal Goal #2  The pt will consume high-energy, high-nutrient dense beverages when necessary to compensate for decreased oral intake of solid foods.    Personal Goal #3  Identify food quantities necessary to achieve wt gain of  -2# per week to a goal wt gain of 2.7-10.9 kg (6-24 lb) at graduation from pulmonary rehab.      Intervention Plan   Intervention  Prescribe, educate and counsel regarding individualized specific dietary modifications aiming towards targeted core components such as weight, hypertension, lipid management, diabetes, heart failure and other comorbidities.    Expected Outcomes  Short Term Goal: Understand basic principles of dietary content, such as calories, fat, sodium, cholesterol and nutrients.;Long Term Goal: Adherence to prescribed nutrition  plan.       Nutrition Assessments: Nutrition Assessments - 07/30/17 1406      Rate Your Plate Scores   Pre Score  48      MEDFICTS Scores   Pre Score  --       Nutrition Goals Re-Evaluation:   Nutrition Goals Discharge (Final Nutrition Goals Re-Evaluation):   Psychosocial: Target Goals: Acknowledge presence or absence of significant depression and/or stress, maximize coping skills, provide positive support system. Participant is able to verbalize types and ability to use techniques and skills needed for reducing stress and depression.  Initial Review & Psychosocial Screening: Initial Psych Review & Screening - 07/26/17 1548      Family Dynamics   Comments  patient states she is grieving the recent loss of "who I was". because of her declining pulmonary health she is no longer able to maintaining an active lifestyle such as biking and hiking and she is being treated for depression.      Screening Interventions   Interventions  Encouraged to exercise;To provide support and resources with identified psychosocial needs;Provide feedback about the scores to participant    Expected Outcomes  Short Term goal: Utilizing psychosocial counselor, staff and physician to assist with identification of specific Stressors or current issues interfering with healing process. Setting desired goal for each stressor or current issue identified.;Long Term Goal: Stressors or current issues are controlled or eliminated.;Short Term goal: Identification and review with participant of any Quality of Life or Depression concerns found by scoring the questionnaire.;Long Term goal: The participant improves quality of Life and PHQ9 Scores as seen by post scores and/or verbalization of changes       Quality of Life Scores:  Scores of 19 and below usually indicate a poorer quality of life in these areas.  A difference of  2-3 points is a clinically meaningful difference.  A difference of 2-3 points in the total  score of the Quality of Life Index has been associated with significant improvement in overall quality of life, self-image, physical symptoms, and general health in studies assessing change in quality of life.   PHQ-9: Recent Review Flowsheet Data    Depression screen [facility] 2/9 07/26/2017   Decreased Interest 0   Down, Depressed, Hopeless 1   PHQ - 2 Score 1   Altered sleeping 0   Tired, decreased energy 3   Change in appetite 0   Feeling bad or failure about yourself  0   Trouble concentrating 0   Moving slowly or fidgety/restless 0   Suicidal thoughts 0   PHQ-9 Score 4   Difficult doing work/chores Not difficult at all     Interpretation of Total Score  Total Score Depression Severity:  1-4 = Minimal depression, 5-9 = Mild depression, 10-14 = Moderate depression, 15-19 = Moderately severe depression, 20-27 = Severe depression   Psychosocial Evaluation and Intervention: Psychosocial Evaluation - 11/16/17 1028      Psychosocial Evaluation & Interventions   Interventions  Encouraged to exercise with the program and follow exercise prescription    Comments  patient has been started on lexapro and feels this is working for her however pt is "closed" when talking with rehab staff.  Pt does not reveal her true feelings.  Will continue to work on establishing an environment of trust so pt will be comfortable being forthcoming with rehab staff     Expected Outcomes  Pt will be more accepting of help and divulge her true feelings regarding her disease process and what she can do toward being accepted on the transplant list.    Continue Psychosocial Services   Follow up required by staff       Psychosocial Re-Evaluation: Psychosocial Re-Evaluation    [place] Name 08/30/17 1805 10/26/17 1135           Psychosocial Re-Evaluation   Current issues with  Current Depression;Current Anxiety/Panic;Current Psychotropic Meds;Current Stress Concerns  Current Depression;Current Anxiety/Panic;Current  Psychotropic Meds;Current Stress Concerns      Comments  patient has significant depression and anxiety over the loss "of who I was". she is  having a very difficult time acception her current health status and an even more difficult time accepting her malnutrition. she is currently seeing a psychiatrist.  patient has significant depression and anxiety over the loss "of who I was". she is having a very difficult time acception her current health status and an even more difficult time accepting her malnutrition. she is currently seeing a psychiatrist.      Expected Outcomes  patient will remain free from psychosocial barriers to participation in pulmonary rehab.  patient will remain free from psychosocial barriers to participation in pulmonary rehab.      Interventions  Encouraged to attend Pulmonary Rehabilitation for the exercise  Encouraged to attend Pulmonary Rehabilitation for the exercise      Continue Psychosocial Services   Follow up required by staff  No Follow up required      Comments  patient is grieving the loss of her health. she is malnurished and she fails to accept a needed intervention. 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  patient is grieving the loss of her health. she is malnurished and she fails to accept a needed intervention. 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        Initial Review   Source of Stress Concerns  Chronic Illness;Poor Coping Skills;Family;Retirement/disability;Unable to participate in former interests or hobbies;Unable to perform yard/household activities  Chronic Illness;Poor Coping Skills;Family;Retirement/disability;Unable to participate in former interests or hobbies;Unable to perform yard/household activities          Psychosocial Discharge (Final Psychosocial Re-Evaluation): Psychosocial Re-Evaluation - 10/26/17 1135      Psychosocial Re-Evaluation   Current issues with  Current Depression;Current Anxiety/Panic;Current Psychotropic Meds;Current Stress Concerns    Comments  patient has significant depression and anxiety over the loss "of who I was". she is having a very difficult time acception her current health status and an even more difficult time accepting her malnutrition. she is currently seeing a psychiatrist.    Expected Outcomes  patient will remain free from psychosocial barriers to participation in pulmonary rehab.    Interventions  Encouraged to attend Pulmonary Rehabilitation for the exercise    Continue Psychosocial Services   No Follow up required    Comments  patient is grieving the loss of her health. she is malnurished and she fails to accept a needed intervention. 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      Initial Review   Source of Stress Concerns  Chronic Illness;Poor Coping Skills;Family;Retirement/disability;Unable to participate in former interests or hobbies;Unable to perform yard/household activities       Education: Education Goals: Education classes will be provided on a weekly basis, covering required topics. Participant will state understanding/return demonstration of topics presented.  Learning Barriers/Preferences: Learning Barriers/Preferences - 07/26/17 1513      Learning Barriers/Preferences   Learning Barriers  None    Learning Preferences  Individual Instruction;Skilled Demonstration;Verbal Instruction;Written Material;Computer/Internet       Education Topics: Risk Factor Reduction:  -Group instruction that is supported by a PowerPoint presentation. Instructor discusses the definition of a risk factor, different risk factors for  pulmonary disease, and how the heart and lungs  work together.     Nutrition for Pulmonary Patient:  -Group instruction provided by PowerPoint slides, verbal discussion, and written materials to support subject matter. The instructor gives an explanation and review of healthy diet recommendations, which includes a discussion on weight management, recommendations for fruit and vegetable consumption, as well as protein, fluid, caffeine, fiber, sodium, sugar, and alcohol. Tips for eating when patients are short of breath are discussed.   PULMONARY REHAB OTHER RESPIRATORY from 11/11/2017 in [place]  Date  09/09/17  Educator  RD  Instruction Review Code  2- Demonstrated Understanding      Pursed Lip Breathing:  -Group instruction that is supported by demonstration and informational handouts. Instructor discusses the benefits of pursed lip and diaphragmatic breathing and detailed demonstration on how to preform both.     Oxygen Safety:  -Group instruction provided by PowerPoint, verbal discussion, and written material to support subject matter. There is an overview of "What is Oxygen" and "Why do we need it".  Instructor also reviews how to create a safe environment for oxygen use, the importance of using oxygen as prescribed, and the risks of noncompliance. There is a brief discussion on traveling with oxygen and resources the patient may utilize.   PULMONARY REHAB OTHER RESPIRATORY from 11/11/2017 in [place]  Date  11/11/17  Educator  Cloyde Reams  Instruction Review Code  1- Verbalizes Understanding      Oxygen Equipment:  -Group instruction provided by [employer] utilizing handouts, written materials, and Insurance underwriter.   Signs and Symptoms:  -Group instruction provided by written material and verbal discussion to support subject matter. Warning signs and symptoms of infection, stroke, and heart attack are  reviewed and when to call the physician/911 reinforced. Tips for preventing the spread of infection discussed.   Advanced Directives:  -Group instruction provided by verbal instruction and written material to support subject matter. Instructor reviews Advanced Directive laws and proper instruction for filling out document.   Pulmonary Video:  -Group video education that reviews the importance of medication and oxygen compliance, exercise, good nutrition, pulmonary hygiene, and pursed lip and diaphragmatic breathing for the pulmonary patient.   Exercise for the Pulmonary Patient:  -Group instruction that is supported by a PowerPoint presentation. Instructor discusses benefits of exercise, core components of exercise, frequency, duration, and intensity of an exercise routine, importance of utilizing pulse oximetry during exercise, safety while exercising, and options of places to exercise outside of rehab.     PULMONARY REHAB OTHER RESPIRATORY from 11/11/2017 in [place]  Date  09/02/17  Educator  Cloyde Reams  Instruction Review Code  1- Verbalizes Understanding      Pulmonary Medications:  -Verbally interactive group education provided by instructor with focus on inhaled medications and proper administration.   PULMONARY REHAB OTHER RESPIRATORY from 11/11/2017 in [place]  Date  10/14/17  Educator  pharmacy  Instruction Review Code  1- Verbalizes Understanding      Anatomy and Physiology of the Respiratory System and Intimacy:  -Group instruction provided by PowerPoint, verbal discussion, and written material to support subject matter. Instructor reviews respiratory cycle and anatomical components of the respiratory system and their functions. Instructor also reviews differences in obstructive and restrictive respiratory diseases with examples of each. Intimacy, Sex, and Sexuality differences are reviewed with a discussion on  how relationships can change when diagnosed with pulmonary disease. Common sexual concerns are reviewed.  MD DAY -A group question and answer session with a medical doctor that allows participants to ask questions that relate to their pulmonary disease state.   OTHER EDUCATION -Group or individual verbal, written, or video instructions that support the educational goals of the pulmonary rehab program.   PULMONARY REHAB OTHER RESPIRATORY from 11/11/2017 in [place]  Date  08/19/17  Educator  Lucianne Lei  Instruction Review Code  2- Demonstrated Understanding      Holiday Eating Survival Tips:  -Group instruction provided by PowerPoint slides, verbal discussion, and written materials to support subject matter. The instructor gives patients tips, tricks, and techniques to help them not only survive but enjoy the holidays despite the onslaught of food that accompanies the holidays.   Knowledge Questionnaire Score: Knowledge Questionnaire Score - 07/26/17 1513      Knowledge Questionnaire Score   Pre Score  13/18       Core Components/Risk Factors/Patient Goals at Admission: Personal Goals and Risk Factors at Admission - 07/26/17 1514      Core Components/Risk Factors/Patient Goals on Admission    Weight Management  Weight Gain    Intervention  Weight Management: Develop a combined nutrition and exercise program designed to reach desired caloric intake, while maintaining appropriate intake of nutrient and fiber, sodium and fats, and appropriate energy expenditure required for the weight goal.;Weight Management: Provide education and appropriate resources to help participant work on and attain dietary goals.    Expected Outcomes  Short Term: Continue to assess and modify interventions until short term weight is achieved;Weight Gain: Understanding of general recommendations for a high calorie, high protein meal plan that promotes weight gain by  distributing calorie intake throughout the day with the consumption for 4-5 meals, snacks, and/or supplements;Understanding of distribution of calorie intake throughout the day with the consumption of 4-5 meals/snacks;Understanding recommendations for meals to include 15-35% energy as protein, 25-35% energy from fat, 35-60% energy from carbohydrates, less than 210m of dietary cholesterol, 20-35 gm of total fiber daily;Long Term: Adherence to nutrition and physical activity/exercise program aimed toward attainment of established weight goal    Improve shortness of breath with ADL's  Yes    Intervention  Provide education, individualized exercise plan and daily activity instruction to help decrease symptoms of SOB with activities of daily living.    Expected Outcomes  Short Term: Improve cardiorespiratory fitness to achieve a reduction of symptoms when performing ADLs;Long Term: Be able to perform more ADLs without symptoms or delay the onset of symptoms       Core Components/Risk Factors/Patient Goals Review:  Goals and Risk Factor Review    Row Name 08/30/17 1756 09/29/17 1508 10/26/17 1132 11/16/17 1024 11/17/17 1240     Core Components/Risk Factors/Patient Goals Review   Personal Goals Review  Weight Management/Obesity;Improve shortness of breath with ADL's;Develop more efficient breathing techniques such as purse lipped breathing and diaphragmatic breathing and practicing self-pacing with activity.;Stress  Weight Management/Obesity;Improve shortness of breath with ADL's;Develop more efficient breathing techniques such as purse lipped breathing and diaphragmatic breathing and practicing self-pacing with activity.;Stress  Weight Management/Obesity;Improve shortness of breath with ADL's weight gain  Weight Management/Obesity;Improve shortness of breath with ADL's  -   Review  patient has attended 6 sessions since admission. it is too soon to evaluate progression towards pulmonary rehab goals. Patient  has multiple barriers to participation in pulmonary rehab. Physical and psychosocial. she has never arrived to class on time and shown up as late as  45 min. RN and patient have discussed her malnutrition at length. RN concerned that her work of breathing is not only related to her pulmonary disease but to her malnutrition. patient states she has been scheduled for a feeding tube multiple times however has cancelled all appointments. she has been encouraged to revisit the idea. admission weight 44.7/last session weight 44.2kg. patient believes she can increase her caloric intake for weight gain however I am unsure if this is possible at this time. Patient has met with department RD. RN has not encouraged workload increases related to the lack of energy and nutrition to facilitate safe exercise. Patient is encouraged to pace herself while on the track and PLB. Unsure if patient will progress in pulmonary rehab if she does not have the proper nutrition. Her stamina and strength may be at a standstill until other issues are addressed.  patients weight warrents close monitoring and intervention with MD if needed and if weight loss is observed with increased activity, pulmonary rehab may need to be placed on hold.  Patient has never arrived to class on time and shown up as late as 30 min. Pt is malnourished and has finally agreed to having a PEG tube placed.  Pt is presently out on medical hold and last attended on 5/28. [place] admission weight 44.7/last session weight 44.0kg.   Patient has met with department RD. Per pt husband who is a participant in cardiac rehab maintenance, pt is doing good but has not began feedings yet.  Pt is to have home health nurse come to the home. Pt is flusing the peg tube to insure patentcy.  Patient is encouraged to pace herself while on the track and PLB. I anticipate when feedings have began and pt ris able to return to pulmonary rehab, pt will begin to show progress in the next 30 days.        Returned to exercise class after PEG tube placement, no weight gain as of yet, weak, slow to progress, level 1 on nustep, 9-10 laps on track  Returned to exercise class after PEG tube placement marginal weight gain of .2 kg due to tube feedings are causing her to be nauseated.  Pt was declined for lung [facility] at [facility] to her weight.  Plan to have dietician talk with pt regarding tube feedings and ways to increase her caloric po intake.  Uses PLB when verbal cues are given, will reveiw the next 30 days more independence with employing breathing techniques. Pt has increased to level 2 on nustep, averages 11 laps on track.  -   Expected Outcomes  see admission expected outcomes.  see admission expected outcomes.  see admission expected outcomes.  see admission expected outcomes.  see admission goals/outcomes.      Core Components/Risk Factors/Patient Goals at Discharge (Final Review):  Goals and Risk Factor Review - 11/17/17 1240      Core Components/Risk Factors/Patient Goals Review   Expected Outcomes  see admission goals/outcomes.       ITP Comments: ITP Comments    Row Name 08/30/17 1755 09/29/17 1508 11/16/17 1024       ITP Comments  Dr. Jennet Maduro, Medical Director  Dr. Jennet Maduro, Medical Director  Dr. Jennet Maduro, Medical Director        Comments:  Pt has completed 20 sessions and will be extended out to 36 sessions due to her prolonged absence for peg placement and recovery time. Cherre Huger, BSN Cardiac and Training and development officer

## 2017-11-18 ENCOUNTER — Encounter (HOSPITAL_COMMUNITY)
Admission: RE | Admit: 2017-11-18 | Discharge: 2017-11-18 | Disposition: A | Discharge status: Home / Self Care | Payer: Medicare Other | Source: Ambulatory Visit | Attending: Pulmonary Disease | Admitting: Pulmonary Disease

## 2017-11-18 VITALS — Ht 67.0 in | Wt 98.1 lb

## 2017-11-18 DIAGNOSIS — J849 Interstitial pulmonary disease, unspecified: Secondary | ICD-10-CM | POA: Diagnosis not present

## 2017-11-18 NOTE — Progress Notes (Addendum)
Daily Session Note  Patient Details  Name: Sara Beasley MRN: [id number] Date of Birth: 04/20/1946 Referring Provider:     Pulmonary Rehab Walk Test from 07/29/2017 in [place]  Referring Provider  Dr. Nelda Marseille      Encounter Date: 11/18/2017  Check In: Session Check In - 11/18/17 1409      Check-In   Supervising physician immediately available to respond to emergencies  [facility] immediately available    Physician(s)  Dr. Tawanna Solo    Location  [facility]    Staff Present  Maurice Small, RN, BSN;Molly DiVincenzo, MS, ACSM RCEP, Exercise Physiologist;Demarkus Remmel Ysidro Evert, RN    Medication changes reported      No    Fall or balance concerns reported     No    Tobacco Cessation  No Change    Warm-up and Cool-down  Performed as group-led instruction    Resistance Training Performed  Yes    VAD Patient?  No    PAD/SET Patient?  No      Pain Assessment   Currently in Pain?  No/denies    Multiple Pain Sites  No       Capillary Blood Glucose: No results found for this or any previous visit (from the past 24 hour(s)).    Social History   Tobacco Use  Smoking Status Former Smoker  . Packs/day: 1.00  . Years: 15.00  . Pack years: 15.00  . Types: Cigarettes  . Last attempt to quit: 04/27/1974  . Years since quitting: 43.5  Smokeless Tobacco Never Used    Goals Met:  Exercise tolerated well No report of cardiac concerns or symptoms Strength training completed today  Goals Unmet:  Not Applicable  Comments: Service time is from 1400 to 1535    Dr. Rush Farmer is Medical Director for Pulmonary Rehab at [facility].

## 2017-11-19 NOTE — Progress Notes (Signed)
Sara Beasley 72 y.o. female   DOB: 1945/05/01 MRN: [id number]          Nutrition 1. Interstitial pulmonary disease, unspecified (HCC)   2. ILD (interstitial lung disease) (HCC)    Past Medical History:  Diagnosis Date  . ALLERGIC RHINITIS   . Allergy   . Chronic sinusitis   . Lung infection    sees Dr. Maple Hudson- had infection 2015  . Osteopenia    Meds reviewed. Remeron, deltasone, os-cal, vit D MVI noted  Ht: Ht Readings from Last 1 Encounters:  07/26/17 5\' 7"  (1.702 m)     Wt:  Wt Readings from Last 3 Encounters:  11/16/17 98 lb 1.7 oz (44.5 kg)  11/11/17 97 lb 10.6 oz (44.3 kg)  11/09/17 97 lb 7.1 oz (44.2 kg)     BMI: Body mass index is 15.37 kg/m.     Current tobacco use? no  Labs:  Lipid Panel  No results found for: CHOL, TRIG, HDL, CHOLHDL, VLDL, LDLCALC, LDLDIRECT  No results found for: HGBA1C Note Spoke with pt today, wanted to check how she is tolerating her tube feeds. Per discussion she is doing 2-4 hours continuous across the day at 65 ml/hr of TwoCal, in addition to 3 daily meals and 1 snack. Reported consuming 3/4 of her meals and has added in a snack. Pt shared she does not like feeling tethered during the day and finds the feeding to be frustrating. Discussed trying 10 hour continuous feedings at night, and explored with pt how this may improve quality of life and ensure she is getting adequate supplemental nutrition. Pt agreed to try. As pt previously experienced bloating and discomfort with her feeds, she was adamant in starting evening feeds at 40 ml/hr and increasing from there. Expressed concern to pt that she has gradually been losing weight (~ 17 lb wt loss, 14.5% decrease over the past 6 years) and this has been detrimental to her nutritional status, activities of daily living, and quality of life. Emphasized importance of ensuring she is getting enough supplemental nutrition to aid in weight gain, and recommended pt increase 10 hour continuous  evening feeds to 70 ml/hr as tolerated. Pt verbalized understanding. Pt educated on High Calorie, High Protein diet. Pt expressed understanding of the information reviewed via feedback method.     24 hour recall: Breakfast: 1.5 cups cereal with 3/4's whole milk + 1 banana + handful of nuts Lunch: Peanut butter and jelly sandwich + 8 oz milk + apple sauce Dinner: Spaghetti with meat sauce, grilled squash, cup of fruit Snack: homemade milkshake + protein powder    Nutrition Diagnosis  Food-and nutrition-related knowledge deficit related to lack of exposure to information as related to diagnosis of pulmonary disease  Increased energy expenditure related to increased energy requirements as evidenced by BMI <20.   Nutrition Intervention ? Pt's individual nutrition plan and goals reviewed with pt. ? Benefits of adopting healthy eating habits discussed when pt's Rate Your Plate reviewed. ? Handouts given for: food delivery companies (prepare meals); High Calorie, High Protein diet, recipes and suggestions for increasing calories and protein.   Goal(s) 1. The pt will recognize symptoms that can interfere with adequate oral intake, such as shortness of breath, N/V, early satiety, fatigue, ability to secure and prepare food, taste and smell changes, chewing/swallowing difficulties, and/ or pain when eating. 2. The pt will consume high-energy, high-nutrient dense beverages when necessary to compensate for decreased oral intake of solid foods. 3. Identify food quantities  necessary to achieve wt gain of  -2# per week to a goal wt gain of 2.7-10.9 kg (6-24 lb) at graduation from pulmonary rehab.  Plan:  Pt to attend Pulmonary Nutrition class Will provide client-centered nutrition education as part of interdisciplinary care.   Monitor and Evaluate progress toward nutrition goal with team.   Ross MarcusAubrey Burklin, MS, RD, LDN 11/19/2017 9:03 AM

## 2017-11-23 ENCOUNTER — Encounter (HOSPITAL_COMMUNITY)
Admission: RE | Admit: 2017-11-23 | Discharge: 2017-11-23 | Disposition: A | Discharge status: Home / Self Care | Payer: Medicare Other | Source: Ambulatory Visit | Attending: Pulmonary Disease | Admitting: Pulmonary Disease

## 2017-11-23 DIAGNOSIS — J849 Interstitial pulmonary disease, unspecified: Secondary | ICD-10-CM | POA: Diagnosis not present

## 2017-11-23 NOTE — Progress Notes (Signed)
Daily Session Note  Patient Details  Name: Sara Beasley MRN: [id number] Date of Birth: July 11, 1945 Referring Provider:     Pulmonary Rehab Walk Test from 07/29/2017 in [place]  Referring Provider  Dr. Nelda Marseille      Encounter Date: 11/23/2017  Check In: Session Check In - 11/23/17 1546      Check-In   Supervising physician immediately available to respond to emergencies  [facility] immediately available    Physician(s)  Dr. Rodena Piety    Location  [facility]    Staff Present  Su Hilt, MS, ACSM RCEP, Exercise Physiologist;Paulo Keimig Ysidro Evert, RN;Carlette Wilber Oliphant, RN, Deland Pretty, MS, ACSM CEP, Exercise Physiologist    Medication changes reported      No    Fall or balance concerns reported     No    Tobacco Cessation  No Change    Warm-up and Cool-down  Performed as group-led instruction    Resistance Training Performed  Yes    VAD Patient?  No    PAD/SET Patient?  No      Pain Assessment   Currently in Pain?  No/denies    Multiple Pain Sites  No       Capillary Blood Glucose: No results found for this or any previous visit (from the past 24 hour(s)).    Social History   Tobacco Use  Smoking Status Former Smoker  . Packs/day: 1.00  . Years: 15.00  . Pack years: 15.00  . Types: Cigarettes  . Last attempt to quit: 04/27/1974  . Years since quitting: 43.6  Smokeless Tobacco Never Used    Goals Met:  Exercise tolerated well No report of cardiac concerns or symptoms Strength training completed today  Goals Unmet:  Not Applicable  Comments: Service time is from 1330 to 1510    Dr. Rush Farmer is Medical Director for Pulmonary Rehab at [facility].

## 2017-11-25 ENCOUNTER — Encounter (HOSPITAL_COMMUNITY)
Admission: RE | Admit: 2017-11-25 | Discharge: 2017-11-25 | Disposition: A | Discharge status: Home / Self Care | Payer: Medicare Other | Source: Ambulatory Visit | Attending: Pulmonary Disease | Admitting: Pulmonary Disease

## 2017-11-25 DIAGNOSIS — Z87891 Personal history of nicotine dependence: Secondary | ICD-10-CM | POA: Insufficient documentation

## 2017-11-25 DIAGNOSIS — Z79899 Other long term (current) drug therapy: Secondary | ICD-10-CM | POA: Diagnosis not present

## 2017-11-25 DIAGNOSIS — J849 Interstitial pulmonary disease, unspecified: Secondary | ICD-10-CM

## 2017-11-25 NOTE — Progress Notes (Signed)
Daily Session Note  Patient Details  Name: Sara Beasley MRN: [id number] Date of Birth: Oct 12, 1945 Referring Provider:     Pulmonary Rehab Walk Test from 07/29/2017 in [place]  Referring Provider  Dr. Nelda Marseille      Encounter Date: 11/25/2017  Check In: Session Check In - 11/25/17 1629      Check-In   Supervising physician immediately available to respond to emergencies  [facility] immediately available    Physician(s)  Dr. Herbert Moors    Location  [facility]    Staff Present  Su Hilt, MS, ACSM RCEP, Exercise Physiologist;Carlette Wilber Oliphant, RN, BSN;Other    Medication changes reported      No    Fall or balance concerns reported     No    Tobacco Cessation  No Change    Warm-up and Cool-down  Performed as group-led instruction    Resistance Training Performed  Yes    VAD Patient?  No    PAD/SET Patient?  No      Pain Assessment   Currently in Pain?  No/denies       Capillary Blood Glucose: No results found for this or any previous visit (from the past 24 hour(s)).    Social History   Tobacco Use  Smoking Status Former Smoker  . Packs/day: 1.00  . Years: 15.00  . Pack years: 15.00  . Types: Cigarettes  . Last attempt to quit: 04/27/1974  . Years since quitting: 43.6  Smokeless Tobacco Never Used    Goals Met:  Exercise tolerated well No report of cardiac concerns or symptoms Strength training completed today  Goals Unmet:  Not Applicable  Comments: Service time is from 1:30p to 3:30p    Dr. Rush Farmer is Medical Director for Pulmonary Rehab at [facility].

## 2017-11-30 ENCOUNTER — Encounter (HOSPITAL_COMMUNITY)
Admission: RE | Admit: 2017-11-30 | Discharge: 2017-11-30 | Disposition: A | Discharge status: Home / Self Care | Payer: Medicare Other | Source: Ambulatory Visit | Attending: Pulmonary Disease | Admitting: Pulmonary Disease

## 2017-11-30 VITALS — Wt 98.3 lb

## 2017-11-30 DIAGNOSIS — J849 Interstitial pulmonary disease, unspecified: Secondary | ICD-10-CM | POA: Diagnosis not present

## 2017-11-30 NOTE — Progress Notes (Signed)
Daily Session Note  Patient Details  Name: Sara Beasley MRN: [id number] Date of Birth: [birth year] Referring Provider:     Pulmonary Rehab Walk Test from 07/29/2017 in [place]  Referring Provider  Dr. Nelda Marseille      Encounter Date: 11/30/2017  Check In:   Capillary Blood Glucose: No results found for this or any previous visit (from the past 24 hour(s)).  Exercise Prescription Changes - 11/30/17 1600      Response to Exercise   Blood Pressure (Admit)  122/68    Blood Pressure (Exercise)  130/78    Blood Pressure (Exit)  112/78    Heart Rate (Admit)  76 bpm    Heart Rate (Exercise)  90 bpm    Heart Rate (Exit)  89 bpm    Oxygen Saturation (Admit)  91 %    Oxygen Saturation (Exercise)  86 %    Oxygen Saturation (Exit)  91 %    Rating of Perceived Exertion (Exercise)  12    Perceived Dyspnea (Exercise)  2    Duration  Progress to 45 minutes of aerobic exercise without signs/symptoms of physical distress    Intensity  THRR unchanged      Progression   Progression  Continue to progress workloads to maintain intensity without signs/symptoms of physical distress.      Resistance Training   Training Prescription  Yes    Weight  orange bands    Reps  10-15    Time  10 Minutes      Interval Training   Interval Training  No      NuStep   Level  3    SPM  80    Minutes  34    METs  1.3       Social History   Tobacco Use  Smoking Status Former Smoker  . Packs/day: 1.00  . Years: 15.00  . Pack years: 15.00  . Types: Cigarettes  . Last attempt to quit: 04/27/1974  . Years since quitting: 43.6  Smokeless Tobacco Never Used    Goals Met:  Exercise tolerated well No report of cardiac concerns or symptoms Strength training completed today  Goals Unmet:  Not Applicable  Comments: Service time is from 1400 to 1515    Dr. Rush Farmer is Medical Director for Pulmonary Rehab at [facility].

## 2017-12-02 ENCOUNTER — Encounter (HOSPITAL_COMMUNITY)
Admission: RE | Admit: 2017-12-02 | Discharge: 2017-12-02 | Disposition: A | Discharge status: Home / Self Care | Payer: Medicare Other | Source: Ambulatory Visit | Attending: Pulmonary Disease | Admitting: Pulmonary Disease

## 2017-12-02 DIAGNOSIS — J849 Interstitial pulmonary disease, unspecified: Secondary | ICD-10-CM

## 2017-12-02 NOTE — Progress Notes (Signed)
Daily Session Note  Patient Details  Name: Sara Beasley MRN: [id number] Date of Birth: 05-03-1945 Referring Provider:     Pulmonary Rehab Walk Test from 07/29/2017 in [place]  Referring Provider  Dr. Nelda Marseille      Encounter Date: 12/02/2017  Check In: Session Check In - 12/02/17 1537      Check-In   Supervising physician immediately available to respond to emergencies  [facility] immediately available    Physician(s)  Dr. [place] Pink    Location  [facility]    Staff Present  Su Hilt, MS, ACSM RCEP, Exercise Physiologist;[name], RN, MHA    Medication changes reported      No    Fall or balance concerns reported     No    Tobacco Cessation  No Change    Warm-up and Cool-down  Performed as group-led instruction    Resistance Training Performed  Yes    VAD Patient?  No    PAD/SET Patient?  No      Pain Assessment   Currently in Pain?  No/denies    Multiple Pain Sites  No       Capillary Blood Glucose: No results found for this or any previous visit (from the past 24 hour(s)).    Social History   Tobacco Use  Smoking Status Former Smoker  . Packs/day: 1.00  . Years: 15.00  . Pack years: 15.00  . Types: Cigarettes  . Last attempt to quit: 04/27/1974  . Years since quitting: 43.6  Smokeless Tobacco Never Used    Goals Met:  Exercise tolerated well No report of cardiac concerns or symptoms Strength training completed today  Goals Unmet:  Not Applicable  Comments: Service time is from 1330 to 1515    Dr. Rush Farmer is Medical Director for Pulmonary Rehab at [facility].

## 2017-12-02 NOTE — Progress Notes (Signed)
I have reviewed a Home Exercise Prescription with Rhodia AlbrightBurwell T Valenta . Rosita FireBurwell is currently exercising at home.  The patient was advised to walk 4 days a week for 15 minutes plus 2-3 sets of walking up and down her flight of stairs.  Abri and I discussed how to progress their exercise prescription.  The patient stated that their goals were to gain overall strength and stamina, better overall physical capacity.  The patient stated that they understand the exercise prescription.  We reviewed exercise guidelines, target heart rate during exercise, RPE Scale, weather conditions, NTG use, endpoints for exercise, warmup and cool down.  Patient is encouraged to come to me with any questions. I will continue to follow up with the patient to assist them with progression and safety.

## 2017-12-07 ENCOUNTER — Encounter (HOSPITAL_COMMUNITY)
Admission: RE | Admit: 2017-12-07 | Discharge: 2017-12-07 | Disposition: A | Discharge status: Home / Self Care | Payer: Medicare Other | Source: Ambulatory Visit | Attending: Pulmonary Disease | Admitting: Pulmonary Disease

## 2017-12-07 DIAGNOSIS — J849 Interstitial pulmonary disease, unspecified: Secondary | ICD-10-CM | POA: Diagnosis not present

## 2017-12-07 NOTE — Progress Notes (Signed)
Daily Session Note  Patient Details  Name: Sara Beasley MRN: [id number] Date of Birth: 08-18-1945 Referring Provider:     Pulmonary Rehab Walk Test from 07/29/2017 in [place]  Referring Provider  Dr. Nelda Marseille      Encounter Date: 12/07/2017  Check In:   Capillary Blood Glucose: No results found for this or any previous visit (from the past 24 hour(s)).    Social History   Tobacco Use  Smoking Status Former Smoker  . Packs/day: 1.00  . Years: 15.00  . Pack years: 15.00  . Types: Cigarettes  . Last attempt to quit: 04/27/1974  . Years since quitting: 43.6  Smokeless Tobacco Never Used    Goals Met:  Exercise tolerated well No report of cardiac concerns or symptoms Strength training completed today  Goals Unmet:  Not Applicable  Comments: Service time is from 1400 to 1500    Dr. Rush Farmer is Medical Director for Pulmonary Rehab at [facility].

## 2017-12-09 ENCOUNTER — Encounter (HOSPITAL_COMMUNITY)
Admission: RE | Admit: 2017-12-09 | Discharge: 2017-12-09 | Disposition: A | Discharge status: Home / Self Care | Payer: Medicare Other | Source: Ambulatory Visit | Attending: Pulmonary Disease | Admitting: Pulmonary Disease

## 2017-12-09 VITALS — Wt 98.1 lb

## 2017-12-09 DIAGNOSIS — J849 Interstitial pulmonary disease, unspecified: Secondary | ICD-10-CM | POA: Diagnosis not present

## 2017-12-09 NOTE — Progress Notes (Signed)
  Daily Session Note  Patient Details  Name: Sara Beasley MRN: [id number] Date of Birth: [birth year] Referring Provider:     Pulmonary Rehab Walk Test from 07/29/2017 in [place]  Referring Provider  Dr. Nelda Marseille      Encounter Date: 12/09/2017  Check In: Session Check In - 12/09/17 1617      Check-In   Supervising physician immediately available to respond to emergencies  [facility] immediately available    Physician(s)  Dr. Florene Glen    Location  [facility]    Staff Present  Su Hilt, MS, ACSM RCEP, Exercise Physiologist;Carlette Wilber Oliphant, RN, BSN;Ramon Dredge, RN, MHA    Medication changes reported      No    Fall or balance concerns reported     No    Tobacco Cessation  No Change    Warm-up and Cool-down  Performed as group-led instruction    Resistance Training Performed  Yes    VAD Patient?  No    PAD/SET Patient?  No      Pain Assessment   Currently in Pain?  No/denies    Multiple Pain Sites  No       Capillary Blood Glucose: No results found for this or any previous visit (from the past 24 hour(s)).    Social History   Tobacco Use  Smoking Status Former Smoker  . Packs/day: 1.00  . Years: 15.00  . Pack years: 15.00  . Types: Cigarettes  . Last attempt to quit: 04/27/1974  . Years since quitting: 43.6  Smokeless Tobacco Never Used    Goals Met:  Achieving weight loss Exercise tolerated well Personal goals reviewed  Goals Unmet:  Not Applicable  Comments: Service time is from 1:30p to 3:30p    Dr. Rush Farmer is Medical Director for Pulmonary Rehab at [facility].

## 2017-12-10 NOTE — Progress Notes (Signed)
Sara Beasley 72 y.o. female   DOB: 01/17/1946 MRN: [id number]          Nutrition 1. ILD (interstitial lung disease) (HCC)   2. Interstitial pulmonary disease, unspecified (HCC)    Past Medical History:  Diagnosis Date  . ALLERGIC RHINITIS   . Allergy   . Chronic sinusitis   . Lung infection    sees Dr. Maple Beasley- had infection 2015  . Osteopenia    Meds reviewed.   Ht: Ht Readings from Last 1 Encounters:  11/19/17 5\' 7"  (1.702 m)     Wt:  Wt Readings from Last 3 Encounters:  11/30/17 98 lb 5.2 oz (44.6 kg)  11/19/17 98 lb 1.7 oz (44.5 kg)  11/16/17 98 lb 1.7 oz (44.5 kg)     BMI: 15.4    Current tobacco use? no  Labs:  Lipid Panel  No results found for: CHOL, TRIG, HDL, CHOLHDL, VLDL, LDLCALC, LDLDIRECT  No results found for: HGBA1C Note Spoke with pt and husbandtoday. Per discussion she is doing 2 hours feeds 3x a day at 50 ml/hr of TwoCal, in addition to 3 daily meals and 1 snack. Reported consuming 3/4 of her meals and has added in a snack. Halted over night feedings d/t bloating and nausea. She disclosed being scared of ripping out her tube. Discussed ways to manage this with patient. Pt shared that she [facility], who advised an elemental formula. Specific enteral formula and rate of feeding not provided. Discussed continuing enteral feeding at 50 ml/hr for 2 hour time periods with patient and increasing ensure plus to 2-3x a day with meals, until specific formula can be obtained through [facility].  Reiterated concern to pt that she has gradually been losing weight (~ 17 lb wt loss, 14.5% decrease over the past 6 years) and this has been detrimental to her nutritional status, activities of daily living, and quality of life. Emphasized importance of ensuring she is getting enough supplemental nutrition to aid in weight gain, as weight status is currently a large risk to her overall health and life. Pt verbalized  understanding.Pt and husband reeducated on High Calorie, High Protein diet . Pt expressed understanding of the information reviewed via feedback method.    Contacted pts RD at [facility] (where patient buys her enteral formula) to discuss obtaining an order for new formula as none is documented in EMR. [facility] to obtain order.     Nutrition Diagnosis  Increased energy expenditure related to increased energy requirements as evidenced by BMI <20.   Nutrition Intervention ? Pt's individual nutrition plan and goals reviewed with pt. ? Benefits of adopting high protein high calorie meal plan discussed when speaking with patient today  Goal(s)  1. The pt will recognize symptoms that can interfere with adequate oral intake, such as shortness of breath, N/V, early satiety, fatigue, ability to secure and prepare food, taste and smell changes, chewing/swallowing difficulties, and/ or pain when eating. 2. The pt will consume high-energy, high-nutrient dense beverages when necessary to compensate for decreased oral intake of solid foods. 3. The pt will have family and friends shop for food when necessary so that nourishing foods are always available at home. 4. Identify food quantities necessary to achieve wt gain of  -2# per week to a goal wt gain of 6-24 lb at graduation from pulmonary rehab.   Plan:  Pt to attend Pulmonary  Nutrition class Will provide client-centered nutrition education as part of interdisciplinary care.    Monitor and Evaluate progress toward nutrition goal with team.   Sara MarcusAubrey Burklin, MS, RD, LDN 12/10/2017 10:51 AM

## 2017-12-14 ENCOUNTER — Inpatient Hospital Stay (HOSPITAL_COMMUNITY)
Admission: RE | Admit: 2017-12-14 | Discharge: 2017-12-14 | Disposition: A | Discharge status: Home / Self Care | Payer: Medicare Other | Source: Ambulatory Visit

## 2017-12-14 ENCOUNTER — Telehealth (HOSPITAL_COMMUNITY): Payer: Self-pay

## 2017-12-14 ENCOUNTER — Encounter (HOSPITAL_COMMUNITY): Payer: Self-pay | Admitting: *Deleted

## 2017-12-14 NOTE — Telephone Encounter (Signed)
Pt called today, to report MD advised her to sit out of exercise. Pt reports she is tolerating her Tubefeeds of osmolite 1.0 at 40 mL/hour, runs for 7 hours a day.

## 2017-12-14 NOTE — Progress Notes (Signed)
Daily Session Note  Patient Details  Name: Sara Beasley MRN: [id number] Date of Birth: May 18, 1945 Referring Provider:     Pulmonary Rehab Walk Test from 07/29/2017 in [place]  Referring Provider  Dr. Nelda Marseille      Encounter Date: 12/14/2017  Check In:   Capillary Blood Glucose: No results found for this or any previous visit (from the past 24 hour(s)).  Exercise Prescription Changes - 12/14/17 1600      Response to Exercise   Blood Pressure (Admit)  134/92    Blood Pressure (Exercise)  140/94    Blood Pressure (Exit)  104/84    Heart Rate (Admit)  77 bpm    Heart Rate (Exercise)  89 bpm    Heart Rate (Exit)  83 bpm    Oxygen Saturation (Admit)  93 %    Oxygen Saturation (Exercise)  86 %    Oxygen Saturation (Exit)  96 %    Rating of Perceived Exertion (Exercise)  15    Perceived Dyspnea (Exercise)  4    Duration  Progress to 45 minutes of aerobic exercise without signs/symptoms of physical distress    Intensity  THRR unchanged      Progression   Progression  Continue to progress workloads to maintain intensity without signs/symptoms of physical distress.      Resistance Training   Training Prescription  Yes    Weight  orange bands    Reps  10-15    Time  10 Minutes      Interval Training   Interval Training  No      NuStep   Level  2    SPM  80    Minutes  17    METs  1.2      Track   Laps  6    Minutes  17       Social History   Tobacco Use  Smoking Status Former Smoker  . Packs/day: 1.00  . Years: 15.00  . Pack years: 15.00  . Types: Cigarettes  . Last attempt to quit: 04/27/1974  . Years since quitting: 43.6  Smokeless Tobacco Never Used    Goals Met:  Exercise tolerated well No report of cardiac concerns or symptoms Strength training completed today  Goals Unmet:  Not Applicable.  Comments: Service time is from 1330 to 1530    Dr. Rush Farmer is Medical Director for Pulmonary Rehab at [facility].

## 2017-12-16 ENCOUNTER — Ambulatory Visit (HOSPITAL_COMMUNITY): Payer: Medicare Other

## 2017-12-16 NOTE — Progress Notes (Signed)
Pulmonary Individual Treatment Plan  Patient Details  Name: Sara Beasley MRN: [id number] Date of Birth: 09-Jan-1946 Referring Provider:     Pulmonary Rehab Walk Test from 07/29/2017 in [facility]  Referring Provider  Dr. Nelda Marseille      Initial Encounter Date:    Pulmonary Rehab Walk Test from 07/29/2017 in [facility]  Date  07/29/17      Visit Diagnosis: ILD (interstitial lung disease) ([place])  Interstitial pulmonary disease, unspecified ([place])  Patient's Home Medications on Admission:   Current Outpatient Medications:  .  ALPRAZolam (XANAX) 0.5 MG tablet, Take by mouth 3 (three) times daily as needed. Take 1/2 tab up to TID PRN, Disp: , Rfl:  .  azathioprine (IMURAN) 100 MG tablet, Take 100 mg by mouth daily., Disp: , Rfl:  .  calcium carbonate (OS-CAL) 600 MG TABS, Take 600 mg by mouth every other day. , Disp: , Rfl:  .  cetirizine (ZYRTEC) 10 MG tablet, Take 5 mg by mouth daily., Disp: , Rfl:  .  cholecalciferol (VITAMIN D) 1000 UNITS tablet, Take 2,000 Units by mouth daily. , Disp: , Rfl:  .  escitalopram (LEXAPRO) 10 MG tablet, 10 mg daily. , Disp: , Rfl:  .  ipratropium-albuterol (DUONEB) 0.5-2.5 (3) MG/3ML SOLN, Take 3 mLs by nebulization every 4 (four) hours as needed (SOB, cough, wheezing). (Patient not taking: Reported on 07/26/2017), Disp: 360 mL, Rfl: 0 .  mirtazapine (REMERON SOL-TAB) 15 MG disintegrating tablet, Take 7.5 mg by mouth at bedtime. , Disp: , Rfl:  .  montelukast (SINGULAIR) 10 MG tablet, Take 0.5 tablets (5 mg total) by mouth at bedtime., Disp: 30 tablet, Rfl: 12 .  Multiple Vitamin (MULTIVITAMIN) tablet, Take 1 tablet by mouth every other day. , Disp: , Rfl:  .  predniSONE (DELTASONE) 10 MG tablet, Take 2 tabs daily for 1 week, then resume taking 1 tab daily, Disp: , Rfl:   Past Medical History: Past Medical History:  Diagnosis Date  . ALLERGIC RHINITIS   . Allergy   . Chronic sinusitis   .  Lung infection    sees Dr. Annamaria Boots- had infection 2015  . Osteopenia     Tobacco Use: Social History   Tobacco Use  Smoking Status Former Smoker  . Packs/day: 1.00  . Years: 15.00  . Pack years: 15.00  . Types: Cigarettes  . Last attempt to quit: 04/27/1974  . Years since quitting: 43.6  Smokeless Tobacco Never Used    Labs: Recent Review Flowsheet Data    There is no flowsheet data to display.      Capillary Blood Glucose: No results found for: GLUCAP   Pulmonary Assessment Scores: Pulmonary Assessment Scores    Row Name 07/30/17 0704         ADL UCSD   ADL Phase  Entry       mMRC Score   mMRC Score  1        Pulmonary Function Assessment:   Exercise Target Goals: Exercise Program Goal: Individual exercise prescription set using results from initial 6 min walk test and THRR while considering  patient's activity barriers and safety.   Exercise Prescription Goal: Initial exercise prescription builds to 30-45 minutes a day of aerobic activity, 2-3 days per week.  Home exercise guidelines will be given to patient during program as part of exercise prescription that the participant will acknowledge.  Activity Barriers & Risk Stratification: Activity Barriers & Cardiac Risk Stratification -  07/26/17 1441      Activity Barriers & Cardiac Risk Stratification   Activity Barriers  Deconditioning;Shortness of Breath;Muscular Weakness       6 Minute Walk: 6 Minute Walk    Row Name 07/30/17 0704         6 Minute Walk   Phase  Initial     Distance  1123 feet     Walk Time  6 minutes     # of Rest Breaks  0     MPH  2.12     METS  2.61     RPE  13     Perceived Dyspnea   3     Symptoms  No     Resting HR  80 bpm     Resting BP  160/90     Resting Oxygen Saturation   94 %     Exercise Oxygen Saturation  during 6 min walk  87 %     Max Ex. HR  107 bpm     Max Ex. BP  178/90     2 Minute Post BP  150/100       Interval HR   1 Minute HR  92     2  Minute HR  93     3 Minute HR  97     4 Minute HR  97     5 Minute HR  100     6 Minute HR  107     2 Minute Post HR  85     Interval Heart Rate?  Yes       Interval Oxygen   Interval Oxygen?  Yes     Baseline Oxygen Saturation %  94 %     1 Minute Oxygen Saturation %  94 %     1 Minute Liters of Oxygen  0 L     2 Minute Oxygen Saturation %  91 %     2 Minute Liters of Oxygen  0 L     3 Minute Oxygen Saturation %  88 %     3 Minute Liters of Oxygen  0 L     4 Minute Oxygen Saturation %  88 %     4 Minute Liters of Oxygen  0 L     5 Minute Oxygen Saturation %  87 %     5 Minute Liters of Oxygen  0 L     6 Minute Oxygen Saturation %  94 %     6 Minute Liters of Oxygen  0 L     2 Minute Post Oxygen Saturation %  92 %     2 Minute Post Liters of Oxygen  0 L        Oxygen Initial Assessment: Oxygen Initial Assessment - 07/30/17 0703      Initial 6 min Walk   Oxygen Used  None      Program Oxygen Prescription   Program Oxygen Prescription  None   patient desaturated to 87% during 6MWT will re-evaluate       Oxygen Re-Evaluation: Oxygen Re-Evaluation    Row Name 08/31/17 0818 09/28/17 0737 10/22/17 1059 11/15/17 1216 12/14/17 0726     Program Oxygen Prescription   Program Oxygen Prescription  None  None  None  None  None     Home Oxygen   Home Oxygen Device  None  None  None  None  None   Sleep Oxygen Prescription  None  None  None  None  None   Home Exercise Oxygen Prescription  None  None  None  None  None   Home at Rest Exercise Oxygen Prescription  None  None  None  None  None      Oxygen Discharge (Final Oxygen Re-Evaluation): Oxygen Re-Evaluation - 12/14/17 0726      Program Oxygen Prescription   Program Oxygen Prescription  None      Home Oxygen   Home Oxygen Device  None    Sleep Oxygen Prescription  None    Home Exercise Oxygen Prescription  None    Home at Rest Exercise Oxygen Prescription  None       Initial Exercise Prescription: Initial  Exercise Prescription - 07/30/17 0700      Date of Initial Exercise RX and Referring Provider   Date  07/29/17    Referring Provider  Dr. Nelda Marseille      Bike   Level  1   scitfit   Watts  10    Minutes  17      NuStep   Level  1    SPM  80    Minutes  17    METs  1.5      Track   Laps  8    Minutes  17      Prescription Details   Frequency (times per week)  2    Duration  Progress to 45 minutes of aerobic exercise without signs/symptoms of physical distress      Intensity   THRR 40-80% of Max Heartrate  59-118    Ratings of Perceived Exertion  11-13    Perceived Dyspnea  0-4      Progression   Progression  Continue progressive overload as per policy without signs/symptoms or physical distress.      Resistance Training   Training Prescription  Yes    Weight  blue bands    Reps  10-15       Perform Capillary Blood Glucose checks as needed.  Exercise Prescription Changes:  Exercise Prescription Changes    Row Name 08/10/17 1500 08/24/17 1600 09/07/17 1600 09/16/17 0721 10/14/17 1454     Response to Exercise   Blood Pressure (Admit)  150/94  122/90  148/82  138/92  138/90   Blood Pressure (Exercise)  140/100  140/90  120/80  128/80  140/70   Blood Pressure (Exit)  118/82  138/84  122/78  120/78  130/80   Heart Rate (Admit)  75 bpm  77 bpm  68 bpm  70 bpm  80 bpm   Heart Rate (Exercise)  79 bpm  86 bpm  93 bpm  79 bpm  91 bpm   Heart Rate (Exit)  68 bpm  78 bpm  76 bpm  88 bpm  82 bpm   Oxygen Saturation (Admit)  97 %  94 %  93 %  92 %  94 %   Oxygen Saturation (Exercise)  93 %  90 %  89 %  88 %  92 %   Oxygen Saturation (Exit)  98 %  94 %  95 %  96 %  98 %   Rating of Perceived Exertion (Exercise)  '12  11  12  13  12   '$ Perceived Dyspnea (Exercise)  '2  2  2  2  2   '$ Duration  Progress to 45 minutes of aerobic exercise without signs/symptoms of physical distress  Progress to 45 minutes of aerobic exercise  without signs/symptoms of physical distress  Progress to 45  minutes of aerobic exercise without signs/symptoms of physical distress  Progress to 45 minutes of aerobic exercise without signs/symptoms of physical distress  Progress to 45 minutes of aerobic exercise without signs/symptoms of physical distress   Intensity  Other (comment) 40-80% of HRR  Other (comment) 40-80% of HRR  THRR unchanged  THRR unchanged  THRR unchanged     Progression   Progression  Continue to progress workloads to maintain intensity without signs/symptoms of physical distress.  Continue to progress workloads to maintain intensity without signs/symptoms of physical distress.  Continue to progress workloads to maintain intensity without signs/symptoms of physical distress.  Continue to progress workloads to maintain intensity without signs/symptoms of physical distress.  Continue to progress workloads to maintain intensity without signs/symptoms of physical distress.     Resistance Training   Training Prescription  Yes  Yes  Yes  Yes  Yes   Weight  blue bands  blue bands  blue bands  orange bands  orange bands   Reps  10-15  10-15  10-15  10-15  10-15   Time  10 Minutes  10 Minutes  10 Minutes  10 Minutes  10 Minutes     Interval Training   Interval Training  Yes  Yes  -  -  No     NuStep   Level  1  -  '1  1  1   '$ SPM  80  -  -  -  -   Minutes  51  -  34  17  34   METs  1.2  -  1.9  1.5  1.5     Track   Laps  -  '7  11  10  '$ -   Minutes  -  '17  17  17  '$ -   Row Name 11/02/17 1600 11/16/17 1500 11/30/17 1600 12/14/17 1600       Response to Exercise   Blood Pressure (Admit)  132/80  130/80  122/68  134/92    Blood Pressure (Exercise)  124/80  130/70  130/78  140/94    Blood Pressure (Exit)  106/78  118/70  112/78  104/84    Heart Rate (Admit)  84 bpm  80 bpm  76 bpm  77 bpm    Heart Rate (Exercise)  99 bpm  86 bpm  90 bpm  89 bpm    Heart Rate (Exit)  83 bpm  78 bpm  89 bpm  83 bpm    Oxygen Saturation (Admit)  91 %  94 %  91 %  93 %    Oxygen Saturation (Exercise)  87 %   91 %  86 %  86 %    Oxygen Saturation (Exit)  94 %  91 %  91 %  96 %    Rating of Perceived Exertion (Exercise)  '11  14  12  15    '$ Perceived Dyspnea (Exercise)  '2  3  2  4    '$ Duration  Progress to 45 minutes of aerobic exercise without signs/symptoms of physical distress  Progress to 45 minutes of aerobic exercise without signs/symptoms of physical distress  Progress to 45 minutes of aerobic exercise without signs/symptoms of physical distress  Progress to 45 minutes of aerobic exercise without signs/symptoms of physical distress    Intensity  THRR unchanged  THRR unchanged  THRR unchanged  THRR unchanged      Progression  Progression  Continue to progress workloads to maintain intensity without signs/symptoms of physical distress.  Continue to progress workloads to maintain intensity without signs/symptoms of physical distress.  Continue to progress workloads to maintain intensity without signs/symptoms of physical distress.  Continue to progress workloads to maintain intensity without signs/symptoms of physical distress.      Resistance Training   Training Prescription  Yes  Yes  Yes  Yes    Weight  orange bands  orange bands  orange bands  orange bands    Reps  10-15  10-15  10-15  10-15    Time  10 Minutes  10 Minutes  10 Minutes  10 Minutes      Interval Training   Interval Training  No  No  No  No      NuStep   Level  '1  3  3  2    '$ SPM  -  -  80  80    Minutes  34  34  34  17    METs  1.3  1.5  1.3  1.2      Track   Laps  11  8  -  6    Minutes  17  17  -  17       Exercise Comments:   Exercise Goals and Review:  Exercise Goals    Row Name 07/26/17 1441             Exercise Goals   Increase Physical Activity  Yes       Expected Outcomes  Short Term: Attend rehab on a regular basis to increase amount of physical activity.;Long Term: Add in home exercise to make exercise part of routine and to increase amount of physical activity.;Long Term: Exercising regularly at  least 3-5 days a week.       Increase Strength and Stamina  Yes       Intervention  Provide advice, education, support and counseling about physical activity/exercise needs.;Develop an individualized exercise prescription for aerobic and resistive training based on initial evaluation findings, risk stratification, comorbidities and participant's personal goals.       Expected Outcomes  Short Term: Increase workloads from initial exercise prescription for resistance, speed, and METs.;Short Term: Perform resistance training exercises routinely during rehab and add in resistance training at home;Long Term: Improve cardiorespiratory fitness, muscular endurance and strength as measured by increased METs and functional capacity (6MWT)       Able to understand and use rate of perceived exertion (RPE) scale  Yes       Intervention  Provide education and explanation on how to use RPE scale       Expected Outcomes  Short Term: Able to use RPE daily in rehab to express subjective intensity level;Long Term:  Able to use RPE to guide intensity level when exercising independently       Able to understand and use Dyspnea scale  Yes       Intervention  Provide education and explanation on how to use Dyspnea scale       Expected Outcomes  Short Term: Able to use Dyspnea scale daily in rehab to express subjective sense of shortness of breath during exertion;Long Term: Able to use Dyspnea scale to guide intensity level when exercising independently       Knowledge and understanding of Target Heart Rate Range (THRR)  Yes       Intervention  Provide education and explanation of THRR including how the  numbers were predicted and where they are located for reference       Expected Outcomes  Short Term: Able to state/look up THRR;Short Term: Able to use daily as guideline for intensity in rehab;Long Term: Able to use THRR to govern intensity when exercising independently       Understanding of Exercise Prescription  Yes        Intervention  Provide education, explanation, and written materials on patient's individual exercise prescription       Expected Outcomes  Short Term: Able to explain program exercise prescription;Long Term: Able to explain home exercise prescription to exercise independently          Exercise Goals Re-Evaluation : Exercise Goals Re-Evaluation    Row Name 08/31/17 0818 09/28/17 0712 10/22/17 1059 11/15/17 1216 12/14/17 0726     Exercise Goal Re-Evaluation   Exercise Goals Review  Increase Physical Activity;Increase Strength and Stamina;Able to understand and use Dyspnea scale;Able to understand and use rate of perceived exertion (RPE) scale;Knowledge and understanding of Target Heart Rate Range (THRR);Understanding of Exercise Prescription  Increase Physical Activity;Increase Strength and Stamina;Able to understand and use Dyspnea scale;Able to understand and use rate of perceived exertion (RPE) scale;Knowledge and understanding of Target Heart Rate Range (THRR);Understanding of Exercise Prescription  Increase Physical Activity;Increase Strength and Stamina;Able to understand and use Dyspnea scale;Able to understand and use rate of perceived exertion (RPE) scale;Knowledge and understanding of Target Heart Rate Range (THRR);Understanding of Exercise Prescription  Increase Physical Activity;Increase Strength and Stamina;Able to understand and use Dyspnea scale;Able to understand and use rate of perceived exertion (RPE) scale;Knowledge and understanding of Target Heart Rate Range (THRR);Understanding of Exercise Prescription  Increase Physical Activity;Increase Strength and Stamina;Able to understand and use Dyspnea scale;Able to understand and use rate of perceived exertion (RPE) scale;Knowledge and understanding of Target Heart Rate Range (THRR);Understanding of Exercise Prescription   Comments  Patient has only attended 5 complete rehab sessions. Due to severity of disease and deconditioning, patient is  going to progress slowly in program. MET average places her in a low functional level. She is able to complete 7-8 laps (219f each) in 15 minutes. Will cont. to monitor and motivate as able.   Patient has only attended 5 complete rehab sessions. Due to severity of disease and deconditioning, patient is going to progress slowly in program. MET average places her in a low functional level. She is able to complete 7-8 laps ([id number]) in 15 minutes. Patient is motivated to be here which is a change from when she first started. Patient has has recent hospitalization due to feeding tube placement. Patient will return to exercise once she gets the okay from surgeon. Will cont. to monitor and motivate as able.   Due to severity of disease and deconditioning, patient is going to progress slowly in program. MET average places her in a low functional level. She is able to complete 8-10 laps ([id number]) in 15 minutes. Patient is motivated to be here which is a change from when she first started. Patient has has recent hospitalization due to feeding tube placement. Will cont. to monitor and motivate as able.   Due to severity of disease and deconditioning, patient is going to progress slowly in program. MET average places her in a low functional level. She is able to complete 9-11 laps ([id number]) in 15 minutes. Patient is motivated to be here which is a change from when she first started. Not much weight has been gained. Will cont.  to monitor and motivate as able.   Due to severity of disease and deconditioning, patient is going to progress slowly in program. MET average places her in a low functional level. She is able to complete 6-7 laps (251f each) in 15 minutes. This is a step back from what she was doing. Some rehab days she is unable to walk the track and just stays with non-weight baring exercises. Patient is motivated to be here which is a change from when she first started. Not much weight has been gained. Will  cont. to monitor and motivate as able.    Expected Outcomes  Through exercise at rehab and at home, patient will increase physical capacity and be able to carry out ADL's with ease at home. Patient will also gain the confidence and knowledge to adhere to an exercise regime at home.  Through exercise at rehab and at home, the patient will decrease shortness of breath with daily activities and feel confident in carrying out an exercise regime at home.   Through exercise at rehab and at home, the patient will decrease shortness of breath with daily activities and feel confident in carrying out an exercise regime at home.   Through exercise at rehab and at home, the patient will decrease shortness of breath with daily activities and feel confident in carrying out an exercise regime at home.   Through exercise at rehab and at home, the patient will decrease shortness of breath with daily activities and feel confident in carrying out an exercise regime at home.       Discharge Exercise Prescription (Final Exercise Prescription Changes): Exercise Prescription Changes - 12/14/17 1600      Response to Exercise   Blood Pressure (Admit)  134/92    Blood Pressure (Exercise)  140/94    Blood Pressure (Exit)  104/84    Heart Rate (Admit)  77 bpm    Heart Rate (Exercise)  89 bpm    Heart Rate (Exit)  83 bpm    Oxygen Saturation (Admit)  93 %    Oxygen Saturation (Exercise)  86 %    Oxygen Saturation (Exit)  96 %    Rating of Perceived Exertion (Exercise)  15    Perceived Dyspnea (Exercise)  4    Duration  Progress to 45 minutes of aerobic exercise without signs/symptoms of physical distress    Intensity  THRR unchanged      Progression   Progression  Continue to progress workloads to maintain intensity without signs/symptoms of physical distress.      Resistance Training   Training Prescription  Yes    Weight  orange bands    Reps  10-15    Time  10 Minutes      Interval Training   Interval  Training  No      NuStep   Level  2    SPM  80    Minutes  17    METs  1.2      Track   Laps  6    Minutes  17       Nutrition:  Target Goals: Understanding of nutrition guidelines, daily intake of sodium '1500mg'$ , cholesterol '200mg'$ , calories 30% from fat and 7% or less from saturated fats, daily to have 5 or more servings of fruits and vegetables.  Biometrics: Pre Biometrics - 07/26/17 1531      Pre Biometrics   Grip Strength  26 kg        Nutrition Therapy Plan  and Nutrition Goals: Nutrition Therapy & Goals - 07/30/17 1413      Nutrition Therapy   Diet  High Calorie, High Protein      Personal Nutrition Goals   Nutrition Goal  The pt will recognize symptoms that can interfere with adequate oral intake, such as shortness of breath, N/V, early satiety, fatigue, ability to secure and prepare food, taste and smell changes, chewing/swallowing difficulties, and/ or pain when eating.    Personal Goal #2  The pt will consume high-energy, high-nutrient dense beverages when necessary to compensate for decreased oral intake of solid foods.    Personal Goal #3  Identify food quantities necessary to achieve wt gain of  -2# per week to a goal wt gain of 2.7-10.9 kg (6-24 lb) at graduation from pulmonary rehab.      Intervention Plan   Intervention  Prescribe, educate and counsel regarding individualized specific dietary modifications aiming towards targeted core components such as weight, hypertension, lipid management, diabetes, heart failure and other comorbidities.    Expected Outcomes  Short Term Goal: Understand basic principles of dietary content, such as calories, fat, sodium, cholesterol and nutrients.;Long Term Goal: Adherence to prescribed nutrition plan.       Nutrition Assessments: Nutrition Assessments - 07/30/17 1406      Rate Your Plate Scores   Pre Score  48      MEDFICTS Scores   Pre Score  --       Nutrition Goals Re-Evaluation:   Nutrition Goals  Discharge (Final Nutrition Goals Re-Evaluation):   Psychosocial: Target Goals: Acknowledge presence or absence of significant depression and/or stress, maximize coping skills, provide positive support system. Participant is able to verbalize types and ability to use techniques and skills needed for reducing stress and depression.  Initial Review & Psychosocial Screening: Initial Psych Review & Screening - 07/26/17 1548      Family Dynamics   Comments  patient states she is grieving the recent loss of "who I was". because of her declining pulmonary health she is no longer able to maintaining an active lifestyle such as biking and hiking and she is being treated for depression.      Screening Interventions   Interventions  Encouraged to exercise;To provide support and resources with identified psychosocial needs;Provide feedback about the scores to participant    Expected Outcomes  Short Term goal: Utilizing psychosocial counselor, staff and physician to assist with identification of specific Stressors or current issues interfering with healing process. Setting desired goal for each stressor or current issue identified.;Long Term Goal: Stressors or current issues are controlled or eliminated.;Short Term goal: Identification and review with participant of any Quality of Life or Depression concerns found by scoring the questionnaire.;Long Term goal: The participant improves quality of Life and PHQ9 Scores as seen by post scores and/or verbalization of changes       Quality of Life Scores:  Scores of 19 and below usually indicate a poorer quality of life in these areas.  A difference of  2-3 points is a clinically meaningful difference.  A difference of 2-3 points in the total score of the Quality of Life Index has been associated with significant improvement in overall quality of life, self-image, physical symptoms, and general health in studies assessing change in quality of life.  PHQ-9: Recent  Review Flowsheet Data    Depression screen [facility] 2/9 07/26/2017   Decreased Interest 0   Down, Depressed, Hopeless 1   PHQ - 2 Score 1  Altered sleeping 0   Tired, decreased energy 3   Change in appetite 0   Feeling bad or failure about yourself  0   Trouble concentrating 0   Moving slowly or fidgety/restless 0   Suicidal thoughts 0   PHQ-9 Score 4   Difficult doing work/chores Not difficult at all     Interpretation of Total Score  Total Score Depression Severity:  1-4 = Minimal depression, 5-9 = Mild depression, 10-14 = Moderate depression, 15-19 = Moderately severe depression, 20-27 = Severe depression   Psychosocial Evaluation and Intervention: Psychosocial Evaluation - 12/16/17 1253      Psychosocial Evaluation & Interventions   Interventions  Encouraged to exercise with the program and follow exercise prescription    Comments  patient has been started on lexapro and feels this is working for her however pt is "closed" when talking with rehab staff.  Pt does not reveal her true feelings.  Will continue to work on establishing an environment of trust so pt will be comfortable being forthcoming with rehab staff     Expected Outcomes  Pt will be more accepting of help and divulge her true feelings regarding her disease process and what she can do toward being accepted on the transplant list.    Continue Psychosocial Services   Follow up required by staff       Psychosocial Re-Evaluation: Psychosocial Re-Evaluation    [place] Name 08/30/17 1805 10/26/17 1135 12/16/17 1253         Psychosocial Re-Evaluation   Current issues with  Current Depression;Current Anxiety/Panic;Current Psychotropic Meds;Current Stress Concerns  Current Depression;Current Anxiety/Panic;Current Psychotropic Meds;Current Stress Concerns  Current Depression;Current Anxiety/Panic;Current Psychotropic Meds;Current Stress Concerns     Comments  patient has significant depression and anxiety over the loss "of who I  was". she is having a very difficult time acception her current health status and an even more difficult time accepting her malnutrition. she is currently seeing a psychiatrist.  patient has significant depression and anxiety over the loss "of who I was". she is having a very difficult time acception her current health status and an even more difficult time accepting her malnutrition. she is currently seeing a psychiatrist.  patient has significant depression and anxiety over the loss "of who I was". she is having a very difficult time acception her current health status and an even more difficult time accepting her malnutrition. she is currently seeing a psychiatrist.     Expected Outcomes  patient will remain free from psychosocial barriers to participation in pulmonary rehab.  patient will remain free from psychosocial barriers to participation in pulmonary rehab.  patient will remain free from psychosocial barriers to participation in pulmonary rehab.     Interventions  Encouraged to attend Pulmonary Rehabilitation for the exercise  Encouraged to attend Pulmonary Rehabilitation for the exercise  Encouraged to attend Pulmonary Rehabilitation for the exercise     Continue Psychosocial Services   Follow up required by staff  No Follow up required  Follow up required by staff     Comments  patient is grieving the loss of her health. she is malnurished and she fails to accept a needed intervention. 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  patient is grieving the loss of her health. she is malnurished and she fails to accept a needed intervention. 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  -       Initial Review   Source of Stress Concerns  Chronic Illness;Poor Coping  Skills;Family;Retirement/disability;Unable to participate in former interests or hobbies;Unable to perform yard/household activities  Chronic Illness;Poor Coping Skills;Family;Retirement/disability;Unable to participate in former interests or hobbies;Unable to perform yard/household activities  -        Psychosocial Discharge (Final Psychosocial Re-Evaluation): Psychosocial Re-Evaluation - 12/16/17 1253      Psychosocial Re-Evaluation   Current issues with  Current Depression;Current Anxiety/Panic;Current Psychotropic Meds;Current Stress Concerns    Comments  patient has significant depression and anxiety over the loss "of who I was". she is having a very difficult time acception her current health status and an even more difficult time accepting her malnutrition. she is currently seeing a psychiatrist.    Expected Outcomes  patient will remain free from psychosocial barriers to participation in pulmonary rehab.    Interventions  Encouraged to attend Pulmonary Rehabilitation for the exercise    Continue Psychosocial Services   Follow up required by staff       Education: Education Goals: Education classes will be provided on a weekly basis, covering required topics. Participant will state understanding/return demonstration of topics presented.  Learning Barriers/Preferences: Learning Barriers/Preferences - 07/26/17 1513      Learning Barriers/Preferences   Learning Barriers  None    Learning Preferences  Individual Instruction;Skilled Demonstration;Verbal Instruction;Written Material;Computer/Internet       Education Topics: Risk Factor Reduction:  -Group instruction that is supported by a PowerPoint presentation. Instructor discusses the definition of a risk factor, different risk factors for pulmonary disease, and how the heart and lungs work together.     Nutrition for Pulmonary Patient:  -Group instruction provided by PowerPoint slides, verbal discussion, and written materials  to support subject matter. The instructor gives an explanation and review of healthy diet recommendations, which includes a discussion on weight management, recommendations for fruit and vegetable consumption, as well as protein, fluid, caffeine, fiber, sodium, sugar, and alcohol. Tips for eating when patients are short of breath are discussed.   PULMONARY REHAB OTHER RESPIRATORY from 11/11/2017 in [place]  Date  09/09/17  Educator  RD  Instruction Review Code  2- Demonstrated Understanding      Pursed Lip Breathing:  -Group instruction that is supported by demonstration and informational handouts. Instructor discusses the benefits of pursed lip and diaphragmatic breathing and detailed demonstration on how to preform both.     Oxygen Safety:  -Group instruction provided by PowerPoint, verbal discussion, and written material to support subject matter. There is an overview of "What is Oxygen" and "Why do we need it".  Instructor also reviews how to create a safe environment for oxygen use, the importance of using oxygen as prescribed, and the risks of noncompliance. There is a brief discussion on traveling with oxygen and resources the patient may utilize.   PULMONARY REHAB OTHER RESPIRATORY from 12/09/2017 in [place]  Date  11/11/17  Educator  Cloyde Reams  Instruction Review Code  1- Verbalizes Understanding      Oxygen Equipment:  -Group instruction provided by [employer] utilizing handouts, written materials, and Insurance underwriter.   Signs and Symptoms:  -Group instruction provided by written material and verbal discussion to support subject matter. Warning signs and symptoms of infection, stroke, and heart attack are reviewed and when  to call the physician/911 reinforced. Tips for preventing the spread of infection discussed.   Advanced Directives:  -Group instruction provided by verbal instruction and written  material to support subject matter. Instructor reviews Advanced Directive laws and proper instruction for filling out document.   Pulmonary Video:  -Group video education that reviews the importance of medication and oxygen compliance, exercise, good nutrition, pulmonary hygiene, and pursed lip and diaphragmatic breathing for the pulmonary patient.   Exercise for the Pulmonary Patient:  -Group instruction that is supported by a PowerPoint presentation. Instructor discusses benefits of exercise, core components of exercise, frequency, duration, and intensity of an exercise routine, importance of utilizing pulse oximetry during exercise, safety while exercising, and options of places to exercise outside of rehab.     PULMONARY REHAB OTHER RESPIRATORY from 11/11/2017 in [place]  Date  09/02/17  Educator  Cloyde Reams  Instruction Review Code  1- Verbalizes Understanding      Pulmonary Medications:  -Verbally interactive group education provided by instructor with focus on inhaled medications and proper administration.   PULMONARY REHAB OTHER RESPIRATORY from 11/11/2017 in [place]  Date  10/14/17  Educator  pharmacy  Instruction Review Code  1- Verbalizes Understanding      Anatomy and Physiology of the Respiratory System and Intimacy:  -Group instruction provided by PowerPoint, verbal discussion, and written material to support subject matter. Instructor reviews respiratory cycle and anatomical components of the respiratory system and their functions. Instructor also reviews differences in obstructive and restrictive respiratory diseases with examples of each. Intimacy, Sex, and Sexuality differences are reviewed with a discussion on how relationships can change when diagnosed with pulmonary disease. Common sexual concerns are reviewed.   MD DAY -A group question and answer session with a medical doctor that allows participants  to ask questions that relate to their pulmonary disease state.   PULMONARY REHAB OTHER RESPIRATORY from 12/09/2017 in [place]  Date  11/30/17  Educator  DR. Nelda Marseille  Instruction Review Code  1- Verbalizes Understanding      OTHER EDUCATION -Group or individual verbal, written, or video instructions that support the educational goals of the pulmonary rehab program.   PULMONARY REHAB OTHER RESPIRATORY from 11/11/2017 in [place]  Date  08/19/17  Educator  Lucianne Lei  Instruction Review Code  2- Demonstrated Understanding      Holiday Eating Survival Tips:  -Group instruction provided by PowerPoint slides, verbal discussion, and written materials to support subject matter. The instructor gives patients tips, tricks, and techniques to help them not only survive but enjoy the holidays despite the onslaught of food that accompanies the holidays.   Knowledge Questionnaire Score: Knowledge Questionnaire Score - 07/26/17 1513      Knowledge Questionnaire Score   Pre Score  13/18       Core Components/Risk Factors/Patient Goals at Admission: Personal Goals and Risk Factors at Admission - 07/26/17 1514      Core Components/Risk Factors/Patient Goals on Admission    Weight Management  Weight Gain    Intervention  Weight Management: Develop a combined nutrition and exercise program designed to reach desired caloric intake, while maintaining appropriate intake of nutrient and fiber, sodium and fats, and appropriate energy expenditure required for the weight goal.;Weight Management: Provide education and appropriate resources to help participant work on and attain dietary goals.    Expected Outcomes  Short Term: Continue to assess and  modify interventions until short term weight is achieved;Weight Gain: Understanding of general recommendations for a high calorie, high protein meal plan that promotes weight gain by distributing  calorie intake throughout the day with the consumption for 4-5 meals, snacks, and/or supplements;Understanding of distribution of calorie intake throughout the day with the consumption of 4-5 meals/snacks;Understanding recommendations for meals to include 15-35% energy as protein, 25-35% energy from fat, 35-60% energy from carbohydrates, less than '200mg'$  of dietary cholesterol, 20-35 gm of total fiber daily;Long Term: Adherence to nutrition and physical activity/exercise program aimed toward attainment of established weight goal    Improve shortness of breath with ADL's  Yes    Intervention  Provide education, individualized exercise plan and daily activity instruction to help decrease symptoms of SOB with activities of daily living.    Expected Outcomes  Short Term: Improve cardiorespiratory fitness to achieve a reduction of symptoms when performing ADLs;Long Term: Be able to perform more ADLs without symptoms or delay the onset of symptoms       Core Components/Risk Factors/Patient Goals Review:  Goals and Risk Factor Review    [place] Name 08/30/17 1756 09/29/17 1508 10/26/17 1132 11/16/17 1024 11/17/17 1240     Core Components/Risk Factors/Patient Goals Review   Personal Goals Review  Weight Management/Obesity;Improve shortness of breath with ADL's;Develop more efficient breathing techniques such as purse lipped breathing and diaphragmatic breathing and practicing self-pacing with activity.;Stress  Weight Management/Obesity;Improve shortness of breath with ADL's;Develop more efficient breathing techniques such as purse lipped breathing and diaphragmatic breathing and practicing self-pacing with activity.;Stress  Weight Management/Obesity;Improve shortness of breath with ADL's weight gain  Weight Management/Obesity;Improve shortness of breath with ADL's  -   Review  patient has attended 6 sessions since admission. it is too soon to evaluate progression towards pulmonary rehab goals. Patient has multiple  barriers to participation in pulmonary rehab. Physical and psychosocial. she has never arrived to class on time and shown up as late as 45 min. RN and patient have discussed her malnutrition at length. RN concerned that her work of breathing is not only related to her pulmonary disease but to her malnutrition. patient states she has been scheduled for a feeding tube multiple times however has cancelled all appointments. she has been encouraged to revisit the idea. admission weight 44.7/last session weight 44.2kg. patient believes she can increase her caloric intake for weight gain however I am unsure if this is possible at this time. Patient has met with department RD. RN has not encouraged workload increases related to the lack of energy and nutrition to facilitate safe exercise. Patient is encouraged to pace herself while on the track and PLB. Unsure if patient will progress in pulmonary rehab if she does not have the proper nutrition. Her stamina and strength may be at a standstill until other issues are addressed.  patients weight warrents close monitoring and intervention with MD if needed and if weight loss is observed with increased activity, pulmonary rehab may need to be placed on hold.  Patient has never arrived to class on time and shown up as late as 30 min. Pt is malnourished and has finally agreed to having a PEG tube placed.  Pt is presently out on medical hold and last attended on 5/28. [facility] admission weight 44.7/last session weight 44.0kg.   Patient has met with department RD. Per pt husband who is a participant in cardiac rehab maintenance, pt is doing good but has not began feedings yet.  Pt is to have home  health nurse come to the home. Pt is flusing the peg tube to insure patentcy.  Patient is encouraged to pace herself while on the track and PLB. I anticipate when feedings have began and pt ris able to return to pulmonary rehab, pt will begin to show progress in the next 30 days.        Returned to exercise class after PEG tube placement, no weight gain as of yet, weak, slow to progress, level 1 on nustep, 9-10 laps on track  Returned to exercise class after PEG tube placement marginal weight gain of .2 kg due to tube feedings are causing her to be nauseated.  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  Plan to have dietician talk with pt regarding tube feedings and ways to increase her caloric po intake.  Uses PLB when verbal cues are given, will reveiw the next 30 days more independence with employing breathing techniques. Pt has increased to level 2 on nustep, averages 11 laps on track.  -   Expected Outcomes  see admission expected outcomes.  see admission expected outcomes.  see admission expected outcomes.  see admission expected outcomes.  see admission goals/outcomes.   [place] Name 12/16/17 1255             Core Components/Risk Factors/Patient Goals Review   Personal Goals Review  Weight Management/Obesity;Improve shortness of breath with ADL's       Review    Pt was declined for lung transplant at [facility] to her weight.  Dietician has conseled both patient and husban regarding tube feedings and ways to increase her caloric po intake.  Pt to switch to a gentler formula.  Too soon to tell if she has any nausea symptoms. Uses PLB when verbal cues are given pt with lower exertional saturations, awaiting to hear from pt pulmonolgist for plan. Pt has increased to level 3 on nustep, averages 6-7 laps on track.  Would like to see pt arrive on time for exercise and partake in education class.       Expected Outcomes  See Admission Goals/Outcomes.          Core Components/Risk Factors/Patient Goals at Discharge (Final Review):  Goals and Risk Factor Review - 12/16/17 1255      Core Components/Risk Factors/Patient Goals Review   Personal Goals Review  Weight Management/Obesity;Improve shortness of breath with ADL's    Review    Pt was declined for lung transplant at [facility]  to her weight.  Dietician has conseled both patient and husban regarding tube feedings and ways to increase her caloric po intake.  Pt to switch to a gentler formula.  Too soon to tell if she has any nausea symptoms. Uses PLB when verbal cues are given pt with lower exertional saturations, awaiting to hear from pt pulmonolgist for plan. Pt has increased to level 3 on nustep, averages 6-7 laps on track.  Would like to see pt arrive on time for exercise and partake in education class.    Expected Outcomes  See Admission Goals/Outcomes.       ITP Comments: ITP Comments    Row Name 08/30/17 1755 09/29/17 1508 11/16/17 1024 12/16/17 1253     ITP Comments  Dr. Jennet Maduro, Medical Director  Dr. Jennet Maduro, Medical Director  Dr. Jennet Maduro, Medical Director  Dr. Jennet Maduro, Medical Director       Comments:  Pt has completed 28 exercise sessions since 08/10/17.  Cherre Huger, BSN  Cardiac and Pulmonary Rehab Nurse Navigator

## 2017-12-21 ENCOUNTER — Ambulatory Visit (HOSPITAL_COMMUNITY): Payer: Medicare Other

## 2017-12-23 ENCOUNTER — Ambulatory Visit (HOSPITAL_COMMUNITY): Payer: Medicare Other

## 2017-12-28 ENCOUNTER — Ambulatory Visit (HOSPITAL_COMMUNITY): Payer: Medicare Other

## 2017-12-30 ENCOUNTER — Ambulatory Visit (HOSPITAL_COMMUNITY): Payer: Medicare Other

## 2018-01-05 ENCOUNTER — Telehealth (HOSPITAL_COMMUNITY): Payer: Self-pay

## 2018-01-05 NOTE — Telephone Encounter (Signed)
Called patient to check on tube feeding tolerance and left message requesting for patient to call RD back.

## 2018-01-07 ENCOUNTER — Telehealth (HOSPITAL_COMMUNITY): Payer: Self-pay

## 2018-01-07 NOTE — Telephone Encounter (Signed)
Called patient in regards to absence from Pulmonary Rehab. Patient states that at this time she does not feel she can continue exercise with her worsening shortness of breath and fatigue. We will discharge the patient at this time, she is encouraged to contact us in the future when her situation changes.

## 2018-03-04 NOTE — Addendum Note (Signed)
Encounter addended by: Enid Skeens, RD on: 03/04/2018 2:32 PM  Actions taken: Flowsheet data copied forward, Visit Navigator Flowsheet section accepted

## 2018-03-13 NOTE — Progress Notes (Addendum)
Discharge Progress Report  Patient Details  Name: Sara Beasley MRN: [id number] Date of Birth: 12/08/1945 Referring Provider:     Pulmonary Rehab Walk Test from 07/29/2017 in [place]  Referring Provider  Dr. Nelda Marseille       Number of Visits: 28  Reason for Discharge:  Early Exit:  needing more intensive care and hospice services  Smoking History:  Social History   Tobacco Use  Smoking Status Former Smoker  . Packs/day: 1.00  . Years: 15.00  . Pack years: 15.00  . Types: Cigarettes  . Last attempt to quit: 04/27/1974  . Years since quitting: 43.9  Smokeless Tobacco Never Used    Diagnosis:  ILD (interstitial lung disease) ([place])  Interstitial pulmonary disease, unspecified ([place])  ADL [facility]:   Initial Exercise Prescription:   Discharge Exercise Prescription (Final Exercise Prescription Changes): Exercise Prescription Changes - 12/14/17 1600      Response to Exercise   Blood Pressure (Admit)  134/92    Blood Pressure (Exercise)  140/94    Blood Pressure (Exit)  104/84    Heart Rate (Admit)  77 bpm    Heart Rate (Exercise)  89 bpm    Heart Rate (Exit)  83 bpm    Oxygen Saturation (Admit)  93 %    Oxygen Saturation (Exercise)  86 %    Oxygen Saturation (Exit)  96 %    Rating of Perceived Exertion (Exercise)  15    Perceived Dyspnea (Exercise)  4    Duration  Progress to 45 minutes of aerobic exercise without signs/symptoms of physical distress    Intensity  THRR unchanged      Progression   Progression  Continue to progress workloads to maintain intensity without signs/symptoms of physical distress.      Resistance Training   Training Prescription  Yes    Weight  orange bands    Reps  10-15    Time  10 Minutes      Interval Training   Interval Training  No      NuStep   Level  2    SPM  80    Minutes  17    METs  1.2      Track   Laps  6    Minutes  17       Functional Capacity:   Psychological, QOL, Others  - Outcomes: PHQ 2/9: Depression screen PHQ 2/9 07/26/2017  Decreased Interest 0  Down, Depressed, Hopeless 1  PHQ - 2 Score 1  Altered sleeping 0  Tired, decreased energy 3  Change in appetite 0  Feeling bad or failure about yourself  0  Trouble concentrating 0  Moving slowly or fidgety/restless 0  Suicidal thoughts 0  PHQ-9 Score 4  Difficult doing work/chores Not difficult at all  Some recent data might be hidden    Quality of Life:   Personal Goals: Goals established at orientation with interventions provided to work toward goal.    Personal Goals Discharge: Goals and Risk Factor Review    Row Name 09/29/17 1508 10/26/17 1132 11/16/17 1024 11/17/17 1240 12/16/17 1255     Core Components/Risk Factors/Patient Goals Review   Personal Goals Review  Weight Management/Obesity;Improve shortness of breath with ADL's;Develop more efficient breathing techniques such as purse lipped breathing and diaphragmatic breathing and practicing self-pacing with activity.;Stress  Weight Management/Obesity;Improve shortness of breath with ADL's weight gain  Weight Management/Obesity;Improve shortness of breath with ADL's  -  Weight Management/Obesity;Improve  shortness of breath with ADL's   Review  Patient has never arrived to class on time and shown up as late as 30 min. Pt is malnourished and has finally agreed to having a PEG tube placed.  Pt is presently out on medical hold and last attended on 5/28. [place] admission weight 44.7/last session weight 44.0kg.   Patient has met with department RD. Per pt husband who is a participant in cardiac rehab maintenance, pt is doing good but has not began feedings yet.  Pt is to have home health nurse come to the home. Pt is flusing the peg tube to insure patentcy.  Patient is encouraged to pace herself while on the track and PLB. I anticipate when feedings have began and pt ris able to return to pulmonary rehab, pt will begin to show progress in the next 30 days.        Returned to exercise class after PEG tube placement, no weight gain as of yet, weak, slow to progress, level 1 on nustep, 9-10 laps on track  Returned to exercise class after PEG tube placement marginal weight gain of .2 kg due to tube feedings are causing her to be nauseated.  Pt was declined for lung transplant at [facility] to her weight.  Plan to have dietician talk with pt regarding tube feedings and ways to increase her caloric po intake.  Uses PLB when verbal cues are given, will reveiw the next 30 days more independence with employing breathing techniques. Pt has increased to level 2 on nustep, averages 11 laps on track.  -    Pt was declined for lung transplant at [facility] to her weight.  Dietician has conseled both patient and husban regarding tube feedings and ways to increase her caloric po intake.  Pt to switch to a gentler formula.  Too soon to tell if she has any nausea symptoms. Uses PLB when verbal cues are given pt with lower exertional saturations, awaiting to hear from pt pulmonolgist for plan. Pt has increased to level 3 on nustep, averages 6-7 laps on track.  Would like to see pt arrive on time for exercise and partake in education class.   Expected Outcomes  see admission expected outcomes.  see admission expected outcomes.  see admission expected outcomes.  see admission goals/outcomes.  See Admission Goals/Outcomes.   [place] Name 03/13/18 1833             Core Components/Risk Factors/Patient Goals Review   Personal Goals Review  Weight Management/Obesity;Improve shortness of breath with ADL's       Review  Pt discharged with the completion of 28 exercise sessions.  Pt with further deterioration in her health with the requirement of hospice services.       Expected Outcomes  Pt made very little progress toward goal for weight gain despite tube feedings and extensive consultation with the dietician          Exercise Goals and Review:   Exercise Goals Re-Evaluation: Exercise Goals  Re-Evaluation    Row Name 09/28/17 0093 10/22/17 1059 11/15/17 1216 12/14/17 0726       Exercise Goal Re-Evaluation   Exercise Goals Review  Increase Physical Activity;Increase Strength and Stamina;Able to understand and use Dyspnea scale;Able to understand and use rate of perceived exertion (RPE) scale;Knowledge and understanding of Target Heart Rate Range (THRR);Understanding of Exercise Prescription  Increase Physical Activity;Increase Strength and Stamina;Able to understand and use Dyspnea scale;Able to understand and use rate of perceived exertion (RPE) scale;Knowledge  and understanding of Target Heart Rate Range (THRR);Understanding of Exercise Prescription  Increase Physical Activity;Increase Strength and Stamina;Able to understand and use Dyspnea scale;Able to understand and use rate of perceived exertion (RPE) scale;Knowledge and understanding of Target Heart Rate Range (THRR);Understanding of Exercise Prescription  Increase Physical Activity;Increase Strength and Stamina;Able to understand and use Dyspnea scale;Able to understand and use rate of perceived exertion (RPE) scale;Knowledge and understanding of Target Heart Rate Range (THRR);Understanding of Exercise Prescription    Comments  Patient has only attended 5 complete rehab sessions. Due to severity of disease and deconditioning, patient is going to progress slowly in program. MET average places her in a low functional level. She is able to complete 7-8 laps (259f each) in 15 minutes. Patient is motivated to be here which is a change from when she first started. Patient has has recent hospitalization due to feeding tube placement. Patient will return to exercise once she gets the okay from surgeon. Will cont. to monitor and motivate as able.   Due to severity of disease and deconditioning, patient is going to progress slowly in program. MET average places her in a low functional level. She is able to complete 8-10 laps (2077feach) in 15  minutes. Patient is motivated to be here which is a change from when she first started. Patient has has recent hospitalization due to feeding tube placement. Will cont. to monitor and motivate as able.   Due to severity of disease and deconditioning, patient is going to progress slowly in program. MET average places her in a low functional level. She is able to complete 9-11 laps (20067fach) in 15 minutes. Patient is motivated to be here which is a change from when she first started. Not much weight has been gained. Will cont. to monitor and motivate as able.   Due to severity of disease and deconditioning, patient is going to progress slowly in program. MET average places her in a low functional level. She is able to complete 6-7 laps ([id number]) in 15 minutes. This is a step back from what she was doing. Some rehab days she is unable to walk the track and just stays with non-weight baring exercises. Patient is motivated to be here which is a change from when she first started. Not much weight has been gained. Will cont. to monitor and motivate as able.     Expected Outcomes  Through exercise at rehab and at home, the patient will decrease shortness of breath with daily activities and feel confident in carrying out an exercise regime at home.   Through exercise at rehab and at home, the patient will decrease shortness of breath with daily activities and feel confident in carrying out an exercise regime at home.   Through exercise at rehab and at home, the patient will decrease shortness of breath with daily activities and feel confident in carrying out an exercise regime at home.   Through exercise at rehab and at home, the patient will decrease shortness of breath with daily activities and feel confident in carrying out an exercise regime at home.        Nutrition & Weight - Outcomes:    Nutrition:   Nutrition Discharge: Nutrition Assessments - 03/04/18 1431      Rate Your Plate Scores   Pre  Score  48    Post Score  --   pt did not return survey      Education Questionnaire Score:   CarlCherre HugerN  Cardiac and Pulmonary Rehab Nurse Navigator

## 2018-03-13 NOTE — Addendum Note (Signed)
Encounter addended by: Porcha Deblanc B, RN on: 03/13/2018 6:41 PM  Actions taken: Episode resolved, Flowsheet data copied forward, Visit Navigator Flowsheet section accepted, Sign clinical note

## 2018-05-16 ENCOUNTER — Other Ambulatory Visit: Payer: Self-pay | Admitting: Internal Medicine

## 2020-08-25 DEATH — deceased
